# Patient Record
Sex: Male | Born: 1940 | Race: White | Hispanic: No | Marital: Married | Smoking: Heavy tobacco smoker
Health system: Southern US, Community
[De-identification: ages and names within clinical notes are randomized; demographics above are authoritative.]

## PROBLEM LIST (undated history)

## (undated) DIAGNOSIS — J449 Chronic obstructive pulmonary disease, unspecified: Secondary | ICD-10-CM

## (undated) DIAGNOSIS — N289 Disorder of kidney and ureter, unspecified: Secondary | ICD-10-CM

## (undated) DIAGNOSIS — G4730 Sleep apnea, unspecified: Secondary | ICD-10-CM

## (undated) DIAGNOSIS — H409 Unspecified glaucoma: Secondary | ICD-10-CM

## (undated) DIAGNOSIS — I10 Essential (primary) hypertension: Secondary | ICD-10-CM

## (undated) DIAGNOSIS — Z950 Presence of cardiac pacemaker: Secondary | ICD-10-CM

## (undated) HISTORY — PX: MEDIAL PARTIAL KNEE REPLACEMENT: SHX5965

## (undated) HISTORY — PX: SHOULDER SURGERY: SHX246

## (undated) HISTORY — PX: PACEMAKER INSERTION: SHX728

## (undated) HISTORY — PX: OTHER SURGICAL HISTORY: SHX169

## (undated) HISTORY — PX: KIDNEY STONE SURGERY: SHX686

## (undated) HISTORY — PX: SPINE SURGERY: SHX786

---

## 1997-11-29 ENCOUNTER — Emergency Department (HOSPITAL_COMMUNITY): Admission: EM | Admit: 1997-11-29 | Discharge: 1997-11-29 | Payer: Self-pay | Admitting: Emergency Medicine

## 1999-10-12 ENCOUNTER — Encounter: Payer: Self-pay | Admitting: Internal Medicine

## 1999-10-12 ENCOUNTER — Encounter (INDEPENDENT_AMBULATORY_CARE_PROVIDER_SITE_OTHER): Payer: Self-pay | Admitting: Specialist

## 1999-10-12 ENCOUNTER — Encounter: Admission: RE | Admit: 1999-10-12 | Discharge: 1999-10-12 | Payer: Self-pay | Admitting: Internal Medicine

## 2000-11-16 ENCOUNTER — Emergency Department (HOSPITAL_COMMUNITY): Admission: EM | Admit: 2000-11-16 | Discharge: 2000-11-16 | Payer: Self-pay | Admitting: Emergency Medicine

## 2000-11-17 ENCOUNTER — Encounter: Admission: RE | Admit: 2000-11-17 | Discharge: 2000-11-17 | Payer: Self-pay | Admitting: *Deleted

## 2000-11-17 ENCOUNTER — Encounter: Payer: Self-pay | Admitting: *Deleted

## 2000-11-21 ENCOUNTER — Encounter: Payer: Self-pay | Admitting: *Deleted

## 2000-11-21 ENCOUNTER — Observation Stay (HOSPITAL_COMMUNITY): Admission: RE | Admit: 2000-11-21 | Discharge: 2000-11-22 | Payer: Self-pay | Admitting: *Deleted

## 2001-11-23 ENCOUNTER — Encounter: Payer: Self-pay | Admitting: Emergency Medicine

## 2001-11-23 ENCOUNTER — Emergency Department (HOSPITAL_COMMUNITY): Admission: EM | Admit: 2001-11-23 | Discharge: 2001-11-23 | Payer: Self-pay | Admitting: Emergency Medicine

## 2003-03-12 ENCOUNTER — Emergency Department (HOSPITAL_COMMUNITY): Admission: AD | Admit: 2003-03-12 | Discharge: 2003-03-12 | Payer: Self-pay | Admitting: Family Medicine

## 2008-05-29 ENCOUNTER — Encounter: Payer: Self-pay | Admitting: Internal Medicine

## 2008-05-30 ENCOUNTER — Inpatient Hospital Stay (HOSPITAL_COMMUNITY): Admission: EM | Admit: 2008-05-30 | Discharge: 2008-05-31 | Payer: Self-pay | Admitting: Emergency Medicine

## 2008-05-30 ENCOUNTER — Encounter (INDEPENDENT_AMBULATORY_CARE_PROVIDER_SITE_OTHER): Payer: Self-pay | Admitting: Internal Medicine

## 2008-07-25 ENCOUNTER — Ambulatory Visit (HOSPITAL_COMMUNITY): Admission: RE | Admit: 2008-07-25 | Discharge: 2008-07-26 | Payer: Self-pay | Admitting: Cardiology

## 2009-03-14 ENCOUNTER — Inpatient Hospital Stay (HOSPITAL_COMMUNITY): Admission: EM | Admit: 2009-03-14 | Discharge: 2009-03-15 | Payer: Self-pay | Admitting: Emergency Medicine

## 2010-05-02 LAB — DIFFERENTIAL
Basophils Absolute: 0 10*3/uL (ref 0.0–0.1)
Basophils Relative: 1 % (ref 0–1)
Neutro Abs: 4.3 10*3/uL (ref 1.7–7.7)
Neutrophils Relative %: 59 % (ref 43–77)

## 2010-05-02 LAB — CBC
HCT: 40.3 % (ref 39.0–52.0)
Hemoglobin: 13.4 g/dL (ref 13.0–17.0)
MCHC: 33.3 g/dL (ref 30.0–36.0)
MCHC: 33.5 g/dL (ref 30.0–36.0)
MCV: 96.2 fL (ref 78.0–100.0)
Platelets: 236 10*3/uL (ref 150–400)
RBC: 4.19 MIL/uL — ABNORMAL LOW (ref 4.22–5.81)
RDW: 13.7 % (ref 11.5–15.5)

## 2010-05-02 LAB — POCT CARDIAC MARKERS
CKMB, poc: <1 ng/mL — ABNORMAL LOW (ref 1.0–8.0)
CKMB, poc: <1 ng/mL — ABNORMAL LOW (ref 1.0–8.0)
Troponin i, poc: <0.05 ng/mL (ref 0.00–0.09)

## 2010-05-02 LAB — URINALYSIS, ROUTINE W REFLEX MICROSCOPIC
Bilirubin Urine: NEGATIVE
Ketones, ur: NEGATIVE mg/dL
Nitrite: NEGATIVE
pH: 6.5 (ref 5.0–8.0)

## 2010-05-02 LAB — CARDIAC PANEL(CRET KIN+CKTOT+MB+TROPI)
Relative Index: INVALID (ref 0.0–2.5)
Relative Index: INVALID (ref 0.0–2.5)
Total CK: 70 U/L (ref 7–232)
Troponin I: 0.01 ng/mL (ref 0.00–0.06)

## 2010-05-02 LAB — POCT I-STAT, CHEM 8
Calcium, Ion: 1.12 mmol/L (ref 1.12–1.32)
Chloride: 105 mEq/L (ref 96–112)
HCT: 46 % (ref 39.0–52.0)
Potassium: 3.9 mEq/L (ref 3.5–5.1)

## 2010-05-02 LAB — BASIC METABOLIC PANEL
BUN: 20 mg/dL (ref 6–23)
CO2: 26 mEq/L (ref 19–32)
CO2: 26 mEq/L (ref 19–32)
Calcium: 8.6 mg/dL (ref 8.4–10.5)
Calcium: 8.9 mg/dL (ref 8.4–10.5)
Creatinine, Ser: 0.94 mg/dL (ref 0.4–1.5)
Creatinine, Ser: 1.04 mg/dL (ref 0.4–1.5)
GFR calc Af Amer: >60 mL/min (ref >60)
GFR calc Af Amer: >60 mL/min (ref >60)
Glucose, Bld: 121 mg/dL — ABNORMAL HIGH (ref 70–99)
Glucose, Bld: 96 mg/dL (ref 70–99)

## 2010-05-02 LAB — LIPID PANEL
HDL: 36 mg/dL — ABNORMAL LOW (ref >39)
Total CHOL/HDL Ratio: 2.3 RATIO
VLDL: 9 mg/dL (ref 0–40)

## 2010-05-02 LAB — CK TOTAL AND CKMB (NOT AT ARMC)
CK, MB: 1.3 ng/mL (ref 0.3–4.0)
Relative Index: 1.3 (ref 0.0–2.5)
Total CK: 102 U/L (ref 7–232)

## 2010-05-26 LAB — CARDIAC PANEL(CRET KIN+CKTOT+MB+TROPI)
CK, MB: 2.4 ng/mL (ref 0.3–4.0)
Relative Index: 2.1 (ref 0.0–2.5)
Total CK: 115 U/L (ref 7–232)
Troponin I: 0.01 ng/mL (ref 0.00–0.06)
Troponin I: 0.01 ng/mL (ref 0.00–0.06)

## 2010-05-26 LAB — POCT I-STAT, CHEM 8
Creatinine, Ser: 1.3 mg/dL (ref 0.4–1.5)
Glucose, Bld: 119 mg/dL — ABNORMAL HIGH (ref 70–99)
HCT: 49 % (ref 39.0–52.0)
Hemoglobin: 16.7 g/dL (ref 13.0–17.0)
Potassium: 3.6 mEq/L (ref 3.5–5.1)
TCO2: 27 mmol/L (ref 0–100)

## 2010-05-26 LAB — CBC
HCT: 47.8 % (ref 39.0–52.0)
MCHC: 34.4 g/dL (ref 30.0–36.0)
MCV: 95.4 fL (ref 78.0–100.0)
Platelets: 137 10*3/uL — ABNORMAL LOW (ref 150–400)
RBC: 5.02 MIL/uL (ref 4.22–5.81)

## 2010-05-26 LAB — COMPREHENSIVE METABOLIC PANEL
ALT: 34 U/L (ref 0–53)
AST: 27 U/L (ref 0–37)
Alkaline Phosphatase: 53 U/L (ref 39–117)
Alkaline Phosphatase: 56 U/L (ref 39–117)
BUN: 17 mg/dL (ref 6–23)
CO2: 27 mEq/L (ref 19–32)
CO2: 27 mEq/L (ref 19–32)
Chloride: 105 mEq/L (ref 96–112)
Creatinine, Ser: 0.94 mg/dL (ref 0.4–1.5)
GFR calc Af Amer: >60 mL/min (ref >60)
GFR calc non Af Amer: >60 mL/min (ref >60)
GFR calc non Af Amer: >60 mL/min (ref >60)
Glucose, Bld: 125 mg/dL — ABNORMAL HIGH (ref 70–99)
Glucose, Bld: 114 mg/dL — ABNORMAL HIGH (ref 70–99)
Potassium: 3.7 mEq/L (ref 3.5–5.1)
Potassium: 3.9 mEq/L (ref 3.5–5.1)
Sodium: 137 mEq/L (ref 135–145)
Total Bilirubin: 1.6 mg/dL — ABNORMAL HIGH (ref 0.3–1.2)
Total Protein: 6.3 g/dL (ref 6.0–8.3)

## 2010-05-26 LAB — DIFFERENTIAL
Basophils Relative: 1 % (ref 0–1)
Eosinophils Absolute: 0.1 10*3/uL (ref 0.0–0.7)
Eosinophils Relative: 2 % (ref 0–5)
Monocytes Relative: 13 % — ABNORMAL HIGH (ref 3–12)
Neutrophils Relative %: 49 % (ref 43–77)

## 2010-05-26 LAB — LIPID PANEL
Cholesterol: 102 mg/dL (ref 0–200)
LDL Cholesterol: 63 mg/dL (ref 0–99)
LDL Cholesterol: 67 mg/dL (ref 0–99)
Total CHOL/HDL Ratio: 4.1 RATIO
Triglycerides: 71 mg/dL (ref <150)
Triglycerides: 73 mg/dL (ref <150)
VLDL: 14 mg/dL (ref 0–40)
VLDL: 15 mg/dL (ref 0–40)

## 2010-05-26 LAB — POCT CARDIAC MARKERS: CKMB, poc: 3.1 ng/mL (ref 1.0–8.0)

## 2010-05-26 LAB — LACTIC ACID, PLASMA: Lactic Acid, Venous: 1 mmol/L (ref 0.5–2.2)

## 2010-05-26 LAB — PROTIME-INR: Prothrombin Time: 13.3 seconds (ref 11.6–15.2)

## 2010-06-29 NOTE — Discharge Summary (Signed)
Dylan Harrington, EMBLETON NO.:  [id number]   MEDICAL RECORD NO.:  [id number]          PATIENT TYPE:  INP   LOCATION:                               FACILITY:  [facility]   PHYSICIAN:  Eduard Clos, MDDATE OF BIRTH:  1940-12-09   DATE OF ADMISSION:  05/30/2008  DATE OF DISCHARGE:  05/31/2008                               DISCHARGE SUMMARY   PRIMARY CARE PHYSICIAN:  Lynne Leader, MD. at [facility].   PRIMARY GASTROENTEROLOGIST:  Jordan Hawks. Elnoria Howard, MD.   COURSE IN THE HOSPITAL:  A 70 year old male with history of hypertension  and glaucoma, presently complains of nausea and chest pain more on both  shoulders after he eats.  The patient admitted to telemetry floor.  Serial cardiac enzymes and EKGs were done which were within acceptable  limits.  Cardiology was consulted.  The patient underwent a nuclear  stress Myoview which did not show any ischemic findings.  A 2-D echo  showed EF of 55-60% with systolic function was normal with no regional  wall motion abnormalities.  The patient did have some shortness of  breath and got improved with introduction of Spiriva and Albuterol HFA.  A sonogram of the abdomen did not show any features of acute  cholecystitis.  It did show some abnormal features in the kidney for  which an MRI was done.  The MRI was read as showing only complex cyst in  the kidney and suggested no further workup on that.  Patient also had a  barium swallow which showed mild esophageal dysmotility.  The patient  does have a follow up appointment with Dr. Jeani Hawking for colonoscopy.  I did discuss with the patient and his wife to have an endoscopy as  outpatient for the same for which they have agreed.  At this time of  this dictation, the patient is hemodynamically stable.  The patient was  strongly advised to quit smoking.   PROCEDURES:  1. Chest x-ray on May 29, 2008 shows borderline cardiomegaly, no      acute abnormality.  2. Nuclear  Myoview on May 29, 2008 shows no perfusion defects.  3. Barium swallow on May 30, 2008 shows mild esophageal dysmotility.      No evidence for high-grade stricture or mass.  4. Sonogram of the abdomen on May 30, 2008 shows no acute findings      and no evidence for cholecystitis.  I suspect fatty infiltration of      the liver.  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  5. MRI of the abdomen with and without contrast on May 30, 2008      shows bilateral complex Bosniak category 2 renal cysts that have      been benign and did not require any further workup.   FINAL DIAGNOSES:  1. Atypical chest pain.  2. Shortness of breath from chronic obstructive pulmonary disease.  3.  Tobacco abuse.  4. Renal cysts, benign appearing  5. Mild esophageal dysmotility.  6. Arthritis.   DISCHARGE MEDICATIONS:  1. Lisinopril/HCTZ 20/4.5 mg p.o. b.i.d.  2. Lumigan eye drops as taken previously.  3. Omeprazole 40 mg p.o. daily.  4. Tylenol 650 mg p.o. q.6 h. p.r.n. for pain.  5. Spiriva 18 mcg 1 inhalation daily.  6. Albuterol HFA 2 puffs q.6 h. p.r.n. for shortness of breath.  7. Chantix starter dose pack.   PLAN:  1. The patient advised to follow up with his primary care physician      within a week's time.  2. Follow with Dr. Elnoria Howard for EGD and colonoscopy as scheduled.  3. Strongly advised to quit smoking.  4. To be on cardiac healthy diet.      Eduard Clos, MD  Electronically Signed     ANK/MEDQ  D:  05/31/2008  T:  05/31/2008  Job:  [id number]   cc:   Lynne Leader, MD   Jordan Hawks. Elnoria Howard, MD  Fax: [phone]

## 2010-06-29 NOTE — H&P (Signed)
Dylan Harrington, Dylan NO.:  [id number]   MEDICAL RECORD NO.:  [id number]          PATIENT TYPE:  EMS   LOCATION:  ED                           FACILITY:  [facility]   PHYSICIAN:  Pedro Earls, MD     DATE OF BIRTH:  Oct 01, 1940   DATE OF ADMISSION:  05/28/2008  DATE OF DISCHARGE:                              HISTORY & PHYSICAL   CHIEF COMPLAINT:  Nausea, diaphoresis after eating and right shoulder  pain.   HISTORY OF PRESENT ILLNESS:  This is a 70 year old white male patient  with a past medical history significant for hypertension, smoking  addiction and right chronic shoulder pain who had been experiencing  nausea and diaphoresis since the past 3 days, especially when the  patient tried to eat something.  The patient is also stating that he is  been having this right shoulder pain which is chronic but recently  gotten worse and stayed constant since past few days.  As per the  patient's spouse, he had a history of similar episode where he would  have nausea and diaphoresis after eating many years ago which was  thought to be due to hypoglycemia, but that the patient recovered from  that episode and this has started coming back again.  The patient was  also seen approximately a year ago by Dr. Harlow Asa, Cardiology, to  evaluate his abnormal EKG and the patient subsequently did not get any  stress test done at that time and was in, as far as cardiac was  concerned, was within normal range.   REVIEW OF SYSTEMS:  As above.  Rest of systems are negative.   PAST HISTORY:  1. Hypotension.  2. Chronic back pain.  3. Chronic obstructive pulmonary disease.  4. Degenerative joint disease.  5. Glaucoma.  6. Nephrolithiasis.  7. Sleep apnea.   PAST SURGICAL HISTORY:  Right shoulder surgery for repair.   SOCIAL HISTORY:  Negative for alcohol or IV drugs.  Patient is a smoker,  smoked for 50 years one pack per day.   FAMILY HISTORY:  Noncontributory.   MEDICATIONS:  1. Lumigan.  2. Lisinopril/hydrochlorothiazide 12.5 b.i.d.  3. Naproxen 500 b.i.d.   ALLERGIES:  KEFLEX.   PHYSICAL EXAMINATION:  VITAL SIGNS:  Temperature 97.4,  blood pressure  160/99 to 138/98, pulse is 77 to 78, respirations 16, pulse ox 99% on 2  liters.  GENERAL:  The patient is awake, alert, oriented x3.  Does not appear to  be in acute distress.  HEENT::  Pupils equal, round.  Conjunctiva with no pallor. Extraocular  movements are intact.  NECK:  Supple.  No JVD.  No lymph nodes.  HEART:  No murmurs, heaves, gallops.  CHEST:  Clear.  ABDOMEN:  Soft, nontender.  Bowel sounds present.  No  hepatosplenomegaly.  EXTREMITIES:  No clubbing, cyanosis or edema.  CNS:  Grossly intact.  SKIN:  No rashes.   LABORATORY DATA:  Patient labs are within normal range except platelet  count of 137.  EKG showed inverted T-waves in V1 and V3, which are not  changed from previous EKG from November 20, 2000.   IMPRESSION:  1. Nausea and diaphoresis secondary to pork food  2. Gastroesophageal reflux disease.  3. Hypertension, uncontrolled.  4. Tobacco addiction.  5. Right shoulder pain.   PLAN:  1. Admit to 23 hour telemetry.  Cycle enzymes with a stress test in      the morning to be read by Dr. Harlow Asa.  2. Protonix b.i.d.  3. Discharge if the stress test is negative.      Pedro Earls, MD  Electronically Signed     NS/MEDQ  D:  05/29/2008  T:  05/29/2008  Job:  [id number]   cc:   Madaline Savage, M.D.  Fax: [phone]

## 2010-06-29 NOTE — Discharge Summary (Signed)
NAMEJARRAH, Dylan Harrington NO.:  [id number]   MEDICAL RECORD NO.:  [id number]          PATIENT TYPE:  OIB   LOCATION:  3715                         FACILITY:  [facility]   PHYSICIAN:  Dylan Slot, MD     DATE OF BIRTH:  1940/04/15   DATE OF ADMISSION:  07/25/2008  DATE OF DISCHARGE:  07/26/2008                               DISCHARGE SUMMARY   DISCHARGE DIAGNOSES:  1. 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      a.     Placement of permanent transvenous pacemaker, Medtronic dual-       chamber EnRhythm.  2. Hypertension.  3. Chronic obstructive pulmonary disease with chronic tobacco abuse.  4. Obstructive sleep apnea with CPAP.   DISCHARGE CONDITION:  Stable.   PROCEDURE:  Placement of permanent transvenous pacemaker on July 25, 2008, by Dr. Ritta Harrington, EnRhythm dual chamber.   DISCHARGE MEDICATIONS:  1. Lopressor 25 mg 1 twice a day.  2. Lumigan eye drops in both eyes daily.  3. Lisinopril 20/12.5 one twice a day.  4. Naprosyn 500 mg once or twice a day as before.  5. Ventolin inhaler as needed.  6. Spiriva inhalation daily.  7. Omeprazole 40 mg daily as before.  8. Systane 1 drop both eyes daily and Natural Tears as before.  9. CPAP at bedtime as before.   DISCHARGE CONDITION:  Stable.   DISCHARGE INSTRUCTIONS:  1. No driving for 1 week.  2. No showers for 1 week.  3. Wound care, please see discharge instructions and pacemaker site.  4. Activity as tolerated, not using the left arm.  5. Follow with Dr. Clarene Harrington in 1 week.  The office will call with date      and time for pacer site check and he would need to follow up with.   HISTORY OF PRESENT ILLNESS:  A 70 year old male presented to Dr.  Fredirick Harrington office and Dr. Donavan Harrington, actually patient of Dr. Elsie Harrington with a  history of sleep apnea, wears CPAP at night.  Continues to use tobacco.  He does have COPD.  He is hypertensive.  He had been having  episodes of  general weakness and near-syncope.  Event monitor revealed sinus rhythm  with occasional PVCs and sinus brady to a rate of 38 at night.  He also  had junctional rhythm with rates as 64 and nonsustained ventricular  tachycardia, the SVT appeared to be 2:1 flutter.   He had had a Cardiolite study negative for ischemia, EF 64%, echo with  normal LV systolic function and moderate LVH.  GI evaluation was  negative.   The patient, it was felt due to his arrhythmia, would need a pacemaker,  so we could treat him more effectively.  He was brought in electively on  July 25, 2008, by Dr. Lynnea Harrington, and underwent pacemaker implantation,  which he tolerated well.  His chest x-ray postprocedure showed no  pneumothorax and by the morning of July 26, 2008, he was stable and  ready for discharge home.  We did add Lopressor to  his medical regimen  and he will follow up with Dr. Clarene Harrington as an outpatient.   LABORATORY DATA:  BUN was 16 and creatinine was 0.89.  Preprocedural  labs:  Sodium 139, potassium 3.8, glucose 115, BUN 22, and creatinine  1.10.  UA was clear.  Hemoglobin 16.3, hematocrit 46.6, WBC 7.6, and  platelets 176.  INR of 1 and PTT of 30.   Chest x-ray was evaluated by Dr. Lynnea Harrington, and no pneumothorax was seen.      Dylan Harrington, N.P.      Dylan Slot, MD  Electronically Signed    LRI/MEDQ  D:  07/26/2008  T:  07/26/2008  Job:  [id number]   cc:   Dylan Harrington, M.D.  Dylan A. Melven Sartorius, MD

## 2010-06-29 NOTE — Consult Note (Signed)
[name], Harrington NO.:  [id number]   MEDICAL RECORD NO.:  [id number]          PATIENT TYPE:  INP   LOCATION:                               FACILITY:  [facility]   PHYSICIAN:  Antonieta Iba, MD   DATE OF BIRTH:  1940/06/12   DATE OF CONSULTATION:  05/29/2008  DATE OF DISCHARGE:  05/31/2008                                 CONSULTATION   CHIEF COMPLAINT:  Shortness of breath.   HISTORY OF PRESENT ILLNESS:  [name] is a 70 year old male who is  followed at Urgent Care.  He has a history of hypertension and smoking.  He has had no prior history of coronary disease.  He actually had seen  Dr. Elsie Lincoln approximately a year ago but never got his stress test.  He  does have a baseline abnormal electrocardiogram.  His wife, Dylan Harrington, is a Customer service manager at [employer] who worked on the cardiac floor.  The patient was in his usual health until approximately Monday of this  week.  He developed shortness of breath and became ashen after eating.  This has recurred.  Since then the patient has had dyspnea on exertion.  He denies any chest pain.  He denies any nausea, vomiting or  diaphoresis.  He denies any abdominal pain.  He has no symptoms at rest.  His initial enzymes were negative.  A Persantine Myoview was ordered by  the admitting physicians and is to be done today.   PAST MEDICAL HISTORY:  1. Remarkable for treated hypertension.  2. He has no history of diabetes or dyslipidemia.  3. He has had some arthritis in his right shoulder and has had      previous right shoulder repair.  4. He has had prior back surgery.  5. He has a history of nephrolithiasis and has been hospitalized for      this in the past.  6. He has a history of glaucoma.  7. Records indicate a history of sleep apnea although I could not find      any documentation of this, office records are pending.   CURRENT MEDICATIONS:  Lumigan ophthalmic drops and  lisinopril/hydrochlorothiazide and  Naprosyn.   He is allergic to [facility].   SOCIAL HISTORY:  He is married, he is a pack-a-day smoker and has been  for 50 years.  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   FAMILY HISTORY:  Unremarkable for coronary disease.  His mother had a  history of atrial fibrillation.  His father had scleroderma.   REVIEW OF SYSTEMS:  Essentially unremarkable except noted above.  He  denies any GI bleeding or melena.  He has not had chest pressure or  chest tightness.  There is no history of diabetes.  He says his lipids  have been low.  He has not had recent cough, fever or chills.   PHYSICAL EXAM:  Blood pressure 138/96, pulse 77, temperature 97.4.  GENERAL:  He is a well-developed, well-nourished male in no acute  distress.  HEENT:  Normocephalic, extraocular movements are intact, sclerae is  nonicteric.  Lids and conjunctivae are within normal limits.  NECK:  Without JVD or bruit.  CHEST:  Clear to auscultation and percussion with diminished breath  sounds.  ABDOMEN:  Nontender.  No hepatosplenomegaly, no bruits.  EXTREMITIES:  Without edema.  Distal pulses are 3+/4.  There are no  femoral bruits noted.  NEURO:  Grossly intact.  He is awake, alert, oriented, cooperative,  moves all extremities without obvious deficit.  SKIN:  Warm and dry.   LABS:  White count 5.9, hemoglobin 16.5, hematocrit 47.8, platelets 137.  D-dimer 0.44.  Troponins are negative x2.  For some unknown reason, BMP  is not in the computer.  Chest x-ray shows borderline cardiomegaly with  no abnormalities.  Electrocardiogram is mildly abnormal with some T-wave  inversion in V2 through V4.   IMPRESSION:  1. Dyspnea on exertion, rule out angina.  2. Abnormal electrocardiogram.  3. Treated hypertension.  4. Smoking.  5. History of nephrolithiasis.  6. History of glaucoma.   PLAN:  Patient will proceed with his Persantine Myoview today.  He may  need diagnostic catheterization pending results.  The patient will  be  seen by Dr. Dossie Arbour.      Abelino Derrick, P.A.      Antonieta Iba, MD  Electronically Signed    LKK/MEDQ  D:  05/29/2008  T:  05/29/2008  Job:  [id number]

## 2010-07-02 NOTE — Op Note (Signed)
NAME:  Dylan Harrington, Dylan Harrington                        ACCOUNT NO.:  [id number]   MEDICAL RECORD NO.:  [id number]                   PATIENT TYPE:  EMS   LOCATION:  ED                                   [facility]ACILITY:  [facility]   PHYSICIAN:  Dionne Ano. Everlene Other, M.D.         DATE OF BIRTH:  November 27, 1940   DATE OF PROCEDURE:  DATE OF DISCHARGE:                                 OPERATIVE REPORT   HISTORY:  I had the pleasure to see Dylan Harrington in the emergency room  upon the kind referral from [facility]. I was asked to consult with him in  regards to his upper extremity predicament. This patient had a crank case  fall on his right hand small finger distal phalanx region. He is left hand  dominant. He was given a tetanus shot in the emergency room and I was asked  to see him in regards to his injury. He denies neck, back, chest or  abdominal pain. The patient has been given digital block.   PAST MEDICAL HISTORY:  The patient has a history of hypertension.   PAST SURGICAL HISTORY:  Kidney surgery. The patient has had back surgery as  well as shoulder surgery.   CURRENT MEDICATIONS:  Lisinopril.   ALLERGIES:  KEFLEX.   SOCIAL HISTORY:  He smokes one pack per day, occasionally drinks. He works  on transmissions for a living.   PHYSICAL EXAMINATION:  Reveals a very pleasant male alert and oriented in no  acute distress. He has normal chest exam. Shoulder and elbow examination  appears to be benign on inspection. The patient's right small finger has an  open distal phalanx fracture. The open distal phalanx fracture and closed  nail bed laceration and partial avulsion of the nail. This is an open  injury, there is comminution present. I have reviewed the x-rays which show  a comminuted distal phalanx fracture about the right small finger.   I should note the left upper extremity which is his dominant extremity is  neurovascularly intact without abnormality.   IMPRESSION:  Open right small finger  distal phalanx fracture with nail bed  injury.   PLAN:  I verbally consented him for I&D and repair of structures as  necessary.   DESCRIPTION OF PROCEDURE:  The patient was brought to the procedure area, he  underwent I&D of skin, subcutaneous tissue, bone and nail tissue as well as  the extensor apparatus with copious amounts of irrigation. This was an  incisional debridement accomplished without difficulty. This was an open  fracture. Following this, the fracture ends were approximated. This was done  with digital manipulation. I performed this without difficulty and to my  satisfaction following open treatment of the distal phalanx fracture. The  patient then underwent a very complex nail bed laceration repair. This was  done with 5-0 and 7-0 Chromic suture, interrupted in technique. The wound  edges were quite jagged. I trimmed these  to normal dimensions and then  repaired the patient's nail bed. Following this, the tourniquet was deflated  and hemostasis was noted to be adequate. The patient did have digital tip  refill; however, there was a large amount of ecchymosis present throughout  the finger and contusion. I repaired loosely skin edges and following this  placed a sterile bandage. The patient had Adaptic placed on the eponychial  fold to prevent nail bed adherence. I discussed with him the postoperative  plans, etc. We went over do's and don'ts, etc. I placed him on Percocet for  pain, Robaxin for muscle spasm and Bactrim for antibiotic prophylaxis. He is  going to  returned to the office and see me in 7-10 days, see my therapist in 5 days  and will proceed accordingly. We have discussed all do's and don'ts and all  questions have been encouraged and answered. He tolerated the procedure well  and there were no complications.                                               Dionne Ano. Everlene Other, M.D.    Nash Mantis  D:  11/23/2001  T:  11/24/2001  Job:  [id number]

## 2010-07-02 NOTE — Op Note (Signed)
[facility]  Patient:    Dylan Harrington, Dylan Harrington Visit Number: [id number] MRN: [id number]          Service Type: SUR Location: 3W 0378 02 Attending Physician:  Dalbert Mayotte Dictated by:   Radene Knee., M.D. Proc. Date: 11/21/00 Admit Date:  11/21/2000                             Operative Report  DATE OF BIRTH:  04-Jan-1941  PREOPERATIVE DIAGNOSES: 1. Left ureterovesical junction calculus, 6 x 4 mm with grade    1-2 hydronephrosis above. 2. Glaucoma. 3. Sleep apnea. 4. Hypertension. 5. Elevated uric acid of 7.6.  POSTOPERATIVE DIAGNOSES: 1. Left ureterovesical junction calculus, 6 x 4 mm with grade    1-2 hydronephrosis above. 2. Glaucoma. 3. Sleep apnea. 4. Hypertension. 5. Elevated uric acid of 7.6. 6. Early benign prostatic hypertrophy.  OPERATION PERFORMED:  Cystoscopy, left ureteroscopic stone extraction, and retrograde pyelograms with insertion of a stent (#6 Kwart).  SURGEON:  Radene Knee., M.D.  DESCRIPTION OF PROCEDURE:  This is a 70 year old male brought to the operating room and underwent successful induction of general anesthesia.  Received IV gentamicin 80 mg.  He was prepped and draped in the lithotomy position and the bladder inspected with a #22 cystourethroscope using 70 and 12 degree lenses. There was no stone or tumor in the bladder.  There was edema and some redness around the left ureteral orifice.  The right ureteral orifice appeared normal. There was elevation of the posterior lip by median bar formation.  There was a little early lateral lobe hypertrophy with anterior ________ 3.5 cm.  There is no distal urethral stricture.  Using an 0.038 glidewire and the deflected bridge, an access was obtained up the left ureter, passing the glidewire through a #5 ureteral catheter to the region of the left renal pelvis under fluoroscopic control.  The glidewire was then used to introduce a 4 cm x  15 mm Uromax dilating balloon and the lower ureter was dilated for five minutes with the dilating balloon.  The dilating balloon was then removed.  The guidewire was fixed in place with a hemostat and the 9.5 short ureteroscope was introduced.  On the first passage, the stone was not seen and on the second passage, the stone was seen.  It was engaged with a Pfister-Schwartz stone basket and extracted.  Larey Seat out of the basket into the urethra and was retrieved with alligator forceps from the urethra.  Using the guidewire, an open-ended catheter was replaced into the kidney, and a left pyeloureterogram was performed, and there was no evidence of extravasation.  The ureter was dilated down to the left ureterovesical junction.  Using the guidewire which was replaced through the open-ended catheter, a #6 Kwart-type double-J ureteral stent was inserted under fluoroscopic control with a pull string.  It was noted to have curl in the renal pelvis and a good curl in the bladder on fluoroscopy and cystoscopy. The bladder was drained with a #16 Foley.  The pull string was taped to the penis.  The patient returned to the recovery room in a stable condition. Dictated by:   Radene Knee., M.D. Attending Physician:  Dalbert Mayotte DD:  11/21/00 TD:  11/21/00 Job: [id number]

## 2010-07-02 NOTE — H&P (Signed)
[facility]  Patient:    GOMER, [place] Visit Number: [id number] MRN: [id number]          Service Type: EMS Location: [facility] Attending Physician:  Ilene Qua Dictated by:   Radene Knee., M.D. Admit Date:  11/16/2000 Discharge Date: 11/16/2000                           History and Physical  OUTPATIENT HISTORY AND PHYSICAL  DATE OF BIRTH:  1940/09/12  HISTORY OF PRESENT ILLNESS: This patient, age 70, was seen in the [facility] Emergency Room on midnight November 17, 2000, with pain in the left side and was, apparently, found to have some pyuria.  A culture was taken, he was given Percocet and advised to see me in the office, which he did November 17, 2000, and was found to have a left hydronephrosis by ultrasound.  A KUB suggested a 4 x 6 mm calculus at the left ureterovesical junction.  This 70 year old male has a history of stones dating back to 1968.  He had a stone extraction about 1970, and he last passed a small 2 mm calculus in December 2001.  He has had some prostatitis in the past and BPH, some small renal cysts which were stable on CT August 2001, initially noted in 1998.  The patient gets up three times a night, voids every two to three hours, has a good flow, has just a little burning on urination.  He was started on Cipro 500 b.i.d., given Mepergan Fortis for pain in the office, a culture was obtained, and he was advised that he should have the stone extracted if it does not pass in the next two or three days.  He will strain the urine.  His wife will check the temperature, she is a Engineer, civil (consulting).  ALLERGIES:  KEFLEX.  CURRENT MEDICATIONS:  Percocet, Mepergan Fortis, Naprosyn 500 b.i.d., Alphagan drops for glaucoma, and Z-Bec, and he is on lisinopril HCTZ 20/25 for blood pressure.  FAMILY HISTORY:  Positive for diabetes, hypertension, myocardial infarction, stones, scleroderma, and renal cell carcinoma.  His father  died, age 19, scleroderma.  Grandmother died of renal cell carcinoma, 83.  Brother died of renal cell carcinoma, age 92.  He had four siblings in all.  He is married. He has a sister with systemic lupus and carcinoma of the lung.  SOCIAL HISTORY:  The patient is married.  He smokes one pack of cigarettes daily for more than 35 years.  Occasionally he does use beer on the weekends. Works as a Curator.  REVIEW OF SYSTEMS:  His weight and general health have been stable.  Hearing and vision are good.  His teeth have been removed.  He does occasionally have headaches, for which he uses aspirin and Goodys powders.  CARDIORESPIRATORY: Denies any chest pain but does have COPD and smokers cough, no asthma. GASTROINTESTINAL:  Denies any peptic ulcer disease, hepatitis, or bloody, tarry stools.  EXTREMITIES AND JOINTS:  He does have rather severe arthritis. He has had a shoulder operation in 1988.  NEUROPSYCHIATRIC:  Unremarkable except for the headaches.  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  PAST MEDICAL HISTORY: 1. Hypertension. 2. Stones. 3. Stone extraction 1970. 4. Shoulder operation 1987.  PHYSICAL EXAMINATION:  GENERAL:  Well-developed, well-nourished 70 year old male.  VITAL SIGNS:  Temperature 98.5, pulse 60, respirations 14, blood pressure 120/80.  HEENT:  Ears and tympanic membranes unremarkable.  Eyes react normally to light and accommodation.  Extraocular movements intact.  Pharynx benign. Teeth have been removed and replaced by plates.  NECK:  No enlargement of nodes or thyroid.  CHEST:  Clear to percussion and auscultation.  Increased AP diameter.  HEART:  Normal sinus rhythm, no murmur detected.  ABDOMEN:  Moderately obese.  Liver, kidney, spleen, masses, tenderness, hernias not detected.  GENITOURINARY:  External genitalia, meatus normal, penis uncircumcised.   The epididymides are thickening bilaterally.  The testes are good size and symmetric.  Scrotum, anus, perineum normal.  Rectal tone good.  Prostate smooth, firm, right lobe seems a little larger than the left, about 20 g, but not really very tender.  EXTREMITIES:  No edema.  Good peripheral pulses.  NEUROLOGIC:  Grossly normal reflexes and sensation.  LYMPHATIC:  No nodes palpated.  SKIN:  No skin lesions noted at this time.  LABORATORY DATA:  Ultrasound examination in the office reveals a grade 1-2 hydronephrosis on the left, and he brings a KUB that suggests a 6 x 4 mm calculus in the lower left ureter.  Urinalysis shows 5-10 white cells and red cells.  Culture pending.  DIAGNOSES: 1. Left ureterovesical junction calculus with hydronephrosis, grade 1, stone    estimated 4 x 6 mm. 2. Prostatitis and benign prostatic hypertrophy, last prostate-specific    antigen was 1.18 January 2000. 3. Small renal cyst, stable on computed tomography August 2001. 4. Hypertension. 5. Glaucoma.  PLAN:  Observe the patient with Mepergan Fortis, Percocet for pain, Cipro 500 b.i.d., strain urine, force fluids.  Will see him back here on November 20, 2000.  If his stone has not passed, will proceed with cystoscopy and stone extraction. Dictated by:   Radene Knee., M.D. Attending Physician:  Ilene Qua DD:  11/17/00 TD:  11/18/00 Job: [id number]

## 2013-07-28 ENCOUNTER — Emergency Department (INDEPENDENT_AMBULATORY_CARE_PROVIDER_SITE_OTHER)
Admission: EM | Admit: 2013-07-28 | Discharge: 2013-07-28 | Disposition: A | Discharge status: Home / Self Care | Payer: Medicare Other | Source: Home / Self Care | Attending: Emergency Medicine | Admitting: Emergency Medicine

## 2013-07-28 ENCOUNTER — Encounter (HOSPITAL_COMMUNITY): Payer: Self-pay | Admitting: Emergency Medicine

## 2013-07-28 ENCOUNTER — Emergency Department (INDEPENDENT_AMBULATORY_CARE_PROVIDER_SITE_OTHER): Payer: Medicare Other

## 2013-07-28 DIAGNOSIS — J209 Acute bronchitis, unspecified: Secondary | ICD-10-CM

## 2013-07-28 HISTORY — DX: Sleep apnea, unspecified: G47.30

## 2013-07-28 HISTORY — DX: Essential (primary) hypertension: I10

## 2013-07-28 HISTORY — DX: Chronic obstructive pulmonary disease, unspecified: J44.9

## 2013-07-28 HISTORY — DX: Unspecified glaucoma: H40.9

## 2013-07-28 MED ORDER — GUAIFENESIN-CODEINE 100-10 MG/5ML PO SYRP: 5.0000 mL | ORAL_SOLUTION | Freq: Four times a day (QID) | ORAL | Status: DC | PRN

## 2013-07-28 MED ORDER — DOXYCYCLINE HYCLATE 100 MG PO TABS: 100.0000 mg | ORAL_TABLET | Freq: Two times a day (BID) | ORAL | Status: DC

## 2013-07-28 MED ORDER — PREDNISONE 20 MG PO TABS: 20.0000 mg | ORAL_TABLET | Freq: Two times a day (BID) | ORAL | Status: DC

## 2013-07-28 MED ORDER — ALBUTEROL SULFATE HFA 108 (90 BASE) MCG/ACT IN AERS: 2.0000 | INHALATION_SPRAY | Freq: Four times a day (QID) | RESPIRATORY_TRACT | Status: AC

## 2013-07-28 NOTE — ED Notes (Signed)
Patient complains of cough with chest congestion; states  Wheezing. Denies f/c.

## 2013-07-28 NOTE — Discharge Instructions (Signed)
Acute Bronchitis °Bronchitis is inflammation of the airways that extend from the windpipe into the lungs (bronchi). The inflammation often causes mucus to develop. This leads to a cough, which is the most common symptom of bronchitis.  °In acute bronchitis, the condition usually develops suddenly and goes away over time, usually in a couple weeks. Smoking, allergies, and asthma can make bronchitis worse. Repeated episodes of bronchitis may cause further lung problems.  °CAUSES °Acute bronchitis is most often caused by the same virus that causes a cold. The virus can spread from person to person (contagious).  °SIGNS AND SYMPTOMS  °· Cough.   °· Fever.   °· Coughing up mucus.   °· Body aches.   °· Chest congestion.   °· Chills.   °· Shortness of breath.   °· Sore throat.   °DIAGNOSIS  °Acute bronchitis is usually diagnosed through a physical exam. Tests, such as chest X-rays, are sometimes done to rule out other conditions.  °TREATMENT  °Acute bronchitis usually goes away in a couple weeks. Often times, no medical treatment is necessary. Medicines are sometimes given for relief of fever or cough. Antibiotics are usually not needed but may be prescribed in certain situations. In some cases, an inhaler may be recommended to help reduce shortness of breath and control the cough. A cool mist vaporizer may also be used to help thin bronchial secretions and make it easier to clear the chest.  °HOME CARE INSTRUCTIONS °· Get plenty of rest.   °· Drink enough fluids to keep your urine clear or pale yellow (unless you have a medical condition that requires fluid restriction). Increasing fluids may help thin your secretions and will prevent dehydration.   °· Only take over-the-counter or prescription medicines as directed by your health care provider.   °· Avoid smoking and secondhand smoke. Exposure to cigarette smoke or irritating chemicals will make bronchitis worse. If you are a smoker, consider using nicotine gum or skin  patches to help control withdrawal symptoms. Quitting smoking will help your lungs heal faster.   °· Reduce the chances of another bout of acute bronchitis by washing your hands frequently, avoiding people with cold symptoms, and trying not to touch your hands to your mouth, nose, or eyes.   °· Follow up with your health care provider as directed.   °SEEK MEDICAL CARE IF: °Your symptoms do not improve after 1 week of treatment.  °SEEK IMMEDIATE MEDICAL CARE IF: °· You develop an increased fever or chills.   °· You have chest pain.   °· You have severe shortness of breath. °· You have bloody sputum.   °· You develop dehydration. °· You develop fainting. °· You develop repeated vomiting. °· You develop a severe headache. °MAKE SURE YOU:  °· Understand these instructions. °· Will watch your condition. °· Will get help right away if you are not doing well or get worse. °Document Released: 03/10/2004 Document Revised: 10/03/2012 Document Reviewed: 07/24/2012 °ExitCare® Patient Information ©2014 [employer]. °How to Use an Inhaler °Proper inhaler technique is very important. Good technique ensures that the medicine reaches the lungs. Poor technique results in depositing the medicine on the tongue and back of the throat rather than in the airways. If you do not use the inhaler with good technique, the medicine will not help you. °STEPS TO FOLLOW IF USING AN INHALER WITHOUT AN EXTENSION TUBE °1. Remove the cap from the inhaler. °2. If you are using the inhaler for the first time, you will need to prime it. Shake the inhaler for 5 seconds and release four puffs into the air,   away from your face. Ask your health care provider or pharmacist if you have questions about priming your inhaler. °3. Shake the inhaler for 5 seconds before each breath in (inhalation). °4. Position the inhaler so that the top of the canister faces up. °5. Put your index finger on the top of the medicine canister. Your thumb supports the bottom of the  inhaler. °6. Open your mouth. °7. Either place the inhaler between your teeth and place your lips tightly around the mouthpiece, or hold the inhaler 1 2 inches away from your open mouth. If you are unsure of which technique to use, ask your health care provider. °8. Breathe out (exhale) normally and as completely as possible. °9. Press the canister down with your index finger to release the medicine. °10. At the same time as the canister is pressed, inhale deeply and slowly until your lungs are completely filled. This should take 4 6 seconds. Keep your tongue down. °11. Hold the medicine in your lungs for 5 10 seconds (10 seconds is best). This helps the medicine get into the small airways of your lungs. °12. Breathe out slowly, through pursed lips. Whistling is an example of pursed lips. °13. Wait at least 15 30 seconds between puffs. Continue with the above steps until you have taken the number of puffs your health care provider has ordered. Do not use the inhaler more than your health care provider tells you. °14. Replace the cap on the inhaler. °15. Follow the directions from your health care provider or the inhaler insert for cleaning the inhaler. °STEPS TO FOLLOW IF USING AN INHALER WITH AN EXTENSION (SPACER) °1. Remove the cap from the inhaler. °2. If you are using the inhaler for the first time, you will need to prime it. Shake the inhaler for 5 seconds and release four puffs into the air, away from your face. Ask your health care provider or pharmacist if you have questions about priming your inhaler. °3. Shake the inhaler for 5 seconds before each breath in (inhalation). °4. Place the open end of the spacer onto the mouthpiece of the inhaler. °5. Position the inhaler so that the top of the canister faces up and the spacer mouthpiece faces you. °6. Put your index finger on the top of the medicine canister. Your thumb supports the bottom of the inhaler and the spacer. °7. Breathe out (exhale) normally and as  completely as possible. °8. Immediately after exhaling, place the spacer between your teeth and into your mouth. Close your lips tightly around the spacer. °9. Press the canister down with your index finger to release the medicine. °10. At the same time as the canister is pressed, inhale deeply and slowly until your lungs are completely filled. This should take 4 6 seconds. Keep your tongue down and out of the way. °11. Hold the medicine in your lungs for 5 10 seconds (10 seconds is best). This helps the medicine get into the small airways of your lungs. Exhale. °12. Repeat inhaling deeply through the spacer mouthpiece. Again hold that breath for up to 10 seconds (10 seconds is best). Exhale slowly. If it is difficult to take this second deep breath through the spacer, breathe normally several times through the spacer. Remove the spacer from your mouth. °13. Wait at least 15 30 seconds between puffs. Continue with the above steps until you have taken the number of puffs your health care provider has ordered. Do not use the inhaler more than your health care   provider tells you. °14. Remove the spacer from the inhaler, and place the cap on the inhaler. °15. Follow the directions from your health care provider or the inhaler insert for cleaning the inhaler and spacer. °If you are using different kinds of inhalers, use your quick relief medicine to open the airways 10 15 minutes before using a steroid if instructed to do so by your health care provider. If you are unsure which inhalers to use and the order of using them, ask your health care provider, nurse, or respiratory therapist. °If you are using a steroid inhaler, always rinse your mouth with water after your last puff, then gargle and spit out the water. Do not swallow the water. °AVOID: °· Inhaling before or after starting the spray of medicine. It takes practice to coordinate your breathing with triggering the spray. °· Inhaling through the nose (rather than  the mouth) when triggering the spray. °HOW TO DETERMINE IF YOUR INHALER IS FULL OR NEARLY EMPTY °You cannot know when an inhaler is empty by shaking it. A few inhalers are now being made with dose counters. Ask your health care provider for a prescription that has a dose counter if you feel you need that extra help. If your inhaler does not have a counter, ask your health care provider to help you determine the date you need to refill your inhaler. Write the refill date on a calendar or your inhaler canister. Refill your inhaler 7 10 days before it runs out. Be sure to keep an adequate supply of medicine. This includes making sure it is not expired, and that you have a spare inhaler.  °SEEK MEDICAL CARE IF:  °· Your symptoms are only partially relieved with your inhaler. °· You are having trouble using your inhaler. °· You have some increase in phlegm. °SEEK IMMEDIATE MEDICAL CARE IF:  °· You feel little or no relief with your inhalers. You are still wheezing and are feeling shortness of breath or tightness in your chest or both. °· You have dizziness, headaches, or a fast heart rate. °· You have chills, fever, or night sweats. °· You have a noticeable increase in phlegm production, or there is blood in the phlegm. °MAKE SURE YOU:  °· Understand these instructions. °· Will watch your condition. °· Will get help right away if you are not doing well or get worse. °Document Released: 01/29/2000 Document Revised: 11/21/2012 Document Reviewed: 08/30/2012 °ExitCare® Patient Information ©2014 [employer]. ° °

## 2013-07-28 NOTE — ED Provider Notes (Signed)
Chief Complaint   Chief Complaint  Patient presents with  . URI    History of Present Illness   Dylan Harrington is a 73 year old male who has had a two-week history of cough productive yellow sputum, wheezing, and aching in his chest. He is smoking about a pack of cigarettes a day. He denies any fever or chills. No nasal congestion or rhinorrhea. No sore throat. He has a history of COPD. No history of asthma or pneumonia.  Review of Systems   Other than as noted above, the patient denies any of the following symptoms: Systemic:  No fevers, chills, sweats, or weight loss. ENT:  No nasal congestion, sneezing, itching, postnasal drip, sinus pressure, headache, sore throat, or hoarseness. Lungs:  No wheezing, shortness of breath, chest tightness or congestion. Heart:  No chest pain, tightness, pressure, PND, orthopnea, or ankle edema. GI:  No indigestion, heartburn, waterbrash, burping, abdominal pain, nausea, or vomiting.  PMFSH   Past medical history, family history, social history, meds, and allergies were reviewed.   Physical Examination     Vital signs:  BP 146/86  Pulse 96  Temp(Src) 98.5 F (36.9 C) (Oral)  Resp 20  SpO2 96% General:  Alert and oriented.  In no distress.  Skin warm and dry. ENT: TMs and ear canals normal.  Nasal mucosa normal, without drainage.  Pharynx clear without exudate or drainage.  No intraoral lesions. Neck:  No adenopathy, tenderness or mass.  No JVD. Lungs:  No respiratory distress.  Breath sounds clear and equal bilaterally.  No wheezes, rales or rhonchi. Heart:  Regular rhythm, no gallops or murmers.  No pedal edema. Abdomon:  Soft and nontender.  No organomegaly or mass.  Radiology   Dg Chest 2 View  07/28/2013   CLINICAL DATA:  Productive cough  EXAM: CHEST  2 VIEW  COMPARISON:  07/26/2008  FINDINGS: Cardiomediastinal silhouette is stable. Dual lead cardiac pacemaker is unchanged in position. No acute infiltrate or pleural effusion. No  pulmonary edema. Mild degenerative changes lower thoracic and lumbar spine.  IMPRESSION: No active disease.  Dual lead cardiac pacemaker in place.   Electronically Signed   By: Natasha MeadLiviu  Pop M.D.   On: 07/28/2013 10:34   Assessment   The encounter diagnosis was Acute bronchitis.  Plan     1.  Meds:  The following meds were prescribed:   Discharge Medication List as of 07/28/2013 10:41 AM    START taking these medications   Details  albuterol (PROVENTIL HFA;VENTOLIN HFA) 108 (90 BASE) MCG/ACT inhaler Inhale 2 puffs into the lungs 4 (four) times daily., Starting 07/28/2013, Until Discontinued, Normal    doxycycline (VIBRA-TABS) 100 MG tablet Take 1 tablet (100 mg total) by mouth 2 (two) times daily., Starting 07/28/2013, Until Discontinued, Normal    guaiFENesin-codeine (GUIATUSS AC) 100-10 MG/5ML syrup Take 5 mLs by mouth 4 (four) times daily as needed for cough., Starting 07/28/2013, Until Discontinued, Print    predniSONE (DELTASONE) 20 MG tablet Take 1 tablet (20 mg total) by mouth 2 (two) times daily., Starting 07/28/2013, Until Discontinued, Normal        2.  Patient Education/Counseling:  The patient was given appropriate handouts, self care instructions, and instructed in symptomatic relief.  She was strongly encouraged to quit smoking.  3.  Follow up:  The patient was told to follow up here if no better in one week, or sooner if becoming worse in any way, and given some red flag symptoms such as difficulty breathing  or chest pain which would prompt immediate return.         Reuben Likesavid C Rosilyn Coachman, MD 07/28/13 [phone]

## 2014-05-24 ENCOUNTER — Emergency Department (HOSPITAL_COMMUNITY)
Admission: EM | Admit: 2014-05-24 | Discharge: 2014-05-25 | Disposition: A | Discharge status: Home / Self Care | Payer: Medicare Other | Attending: Emergency Medicine | Admitting: Emergency Medicine

## 2014-05-24 ENCOUNTER — Encounter (HOSPITAL_COMMUNITY): Payer: Self-pay | Admitting: Emergency Medicine

## 2014-05-24 ENCOUNTER — Emergency Department (HOSPITAL_COMMUNITY): Payer: Medicare Other

## 2014-05-24 DIAGNOSIS — I10 Essential (primary) hypertension: Secondary | ICD-10-CM | POA: Insufficient documentation

## 2014-05-24 DIAGNOSIS — Z79899 Other long term (current) drug therapy: Secondary | ICD-10-CM | POA: Insufficient documentation

## 2014-05-24 DIAGNOSIS — H409 Unspecified glaucoma: Secondary | ICD-10-CM | POA: Diagnosis not present

## 2014-05-24 DIAGNOSIS — Z950 Presence of cardiac pacemaker: Secondary | ICD-10-CM | POA: Diagnosis not present

## 2014-05-24 DIAGNOSIS — J449 Chronic obstructive pulmonary disease, unspecified: Secondary | ICD-10-CM | POA: Insufficient documentation

## 2014-05-24 DIAGNOSIS — Z720 Tobacco use: Secondary | ICD-10-CM | POA: Diagnosis not present

## 2014-05-24 DIAGNOSIS — Z791 Long term (current) use of non-steroidal anti-inflammatories (NSAID): Secondary | ICD-10-CM | POA: Insufficient documentation

## 2014-05-24 DIAGNOSIS — R079 Chest pain, unspecified: Secondary | ICD-10-CM | POA: Diagnosis present

## 2014-05-24 DIAGNOSIS — R0789 Other chest pain: Secondary | ICD-10-CM | POA: Diagnosis not present

## 2014-05-24 HISTORY — DX: Presence of cardiac pacemaker: Z95.0

## 2014-05-24 LAB — COMPREHENSIVE METABOLIC PANEL WITH GFR
ALT: 27 U/L (ref 0–53)
AST: 29 U/L (ref 0–37)
Albumin: 3.5 g/dL (ref 3.5–5.2)
Alkaline Phosphatase: 63 U/L (ref 39–117)
Anion gap: 9 (ref 5–15)
BUN: 23 mg/dL (ref 6–23)
CO2: 31 mmol/L (ref 19–32)
Calcium: 9 mg/dL (ref 8.4–10.5)
Chloride: 100 mmol/L (ref 96–112)
Creatinine, Ser: 1.22 mg/dL (ref 0.50–1.35)
GFR calc Af Amer: 66 mL/min — ABNORMAL LOW
GFR calc non Af Amer: 57 mL/min — ABNORMAL LOW
Glucose, Bld: 203 mg/dL — ABNORMAL HIGH (ref 70–99)
Potassium: 4.2 mmol/L (ref 3.5–5.1)
Sodium: 140 mmol/L (ref 135–145)
Total Bilirubin: 1.1 mg/dL (ref 0.3–1.2)
Total Protein: 6.1 g/dL (ref 6.0–8.3)

## 2014-05-24 LAB — CBC WITH DIFFERENTIAL/PLATELET
Basophils Absolute: 0 K/uL (ref 0.0–0.1)
Basophils Relative: 0 % (ref 0–1)
Eosinophils Absolute: 0.2 K/uL (ref 0.0–0.7)
Eosinophils Relative: 2 % (ref 0–5)
HCT: 44.2 % (ref 39.0–52.0)
Hemoglobin: 15 g/dL (ref 13.0–17.0)
Lymphocytes Relative: 21 % (ref 12–46)
Lymphs Abs: 1.8 K/uL (ref 0.7–4.0)
MCH: 32.4 pg (ref 26.0–34.0)
MCHC: 33.9 g/dL (ref 30.0–36.0)
MCV: 95.5 fL (ref 78.0–100.0)
Monocytes Absolute: 0.9 K/uL (ref 0.1–1.0)
Monocytes Relative: 11 % (ref 3–12)
Neutro Abs: 5.9 K/uL (ref 1.7–7.7)
Neutrophils Relative %: 66 % (ref 43–77)
Platelets: 167 K/uL (ref 150–400)
RBC: 4.63 MIL/uL (ref 4.22–5.81)
RDW: 14 % (ref 11.5–15.5)
WBC: 8.8 K/uL (ref 4.0–10.5)

## 2014-05-24 LAB — I-STAT TROPONIN, ED: Troponin i, poc: 0.01 ng/mL (ref 0.00–0.08)

## 2014-05-24 NOTE — ED Provider Notes (Signed)
CSN: [id number]     Arrival date & time 05/24/14  2025 History   First MD Initiated Contact with Patient 05/24/14 2048     Chief Complaint  Patient presents with  . Chest Pain     (Consider location/radiation/quality/duration/timing/severity/associated sxs/prior Treatment) The history is provided by the patient.  Arville LimeMelvin E Bracewell is a 74 y.o. male hx of HTN, COPD, pacemaker for tachybradycardia syndrome who presenting with chest pain. Patient was mowing the lawn earlier today. Ate dinner around 7 PM and then had started onset of left-sided chest pain that radiated to his jaw. Denies any shortness of breath. He has a pacemaker but denies any cardiac stents. Given ASA 325 mg and nitro x 3 by EMS and now pain free.    Past Medical History  Diagnosis Date  . Hypertension   . Glaucoma   . COPD (chronic obstructive pulmonary disease)   . Sleep apnea   . Pacemaker    Past Surgical History  Procedure Laterality Date  . Pacemaker insertion    . Medial partial knee replacement    . Shoulder surgery    . Spine surgery    . Kidney stone surgery    . Stents     No family history on file. History  Substance Use Topics  . Smoking status: Heavy Tobacco Smoker -- 0.50 packs/day    Types: Cigarettes  . Smokeless tobacco: Not on file  . Alcohol Use: 0.6 oz/week    1 Shots of liquor per week     Comment: at least 4 oz a day    Review of Systems  Cardiovascular: Positive for chest pain.  All other systems reviewed and are negative.     Allergies  Keflex  Home Medications   Prior to Admission medications   Medication Sig Start Date End Date Taking? Authorizing Provider  lisinopril-hydrochlorothiazide (PRINZIDE,ZESTORETIC) 20-25 MG per tablet Take 1 tablet by mouth daily.   Yes Historical Provider, MD  metoprolol (LOPRESSOR) 50 MG tablet Take 25 mg by mouth daily.    Yes Historical Provider, MD  zolpidem (AMBIEN) 5 MG tablet Take 10 mg by mouth at bedtime.    Yes Historical  Provider, MD  albuterol (PROVENTIL HFA;VENTOLIN HFA) 108 (90 BASE) MCG/ACT inhaler Inhale 2 puffs into the lungs 4 (four) times daily. Patient not taking: Reported on 05/24/2014 07/28/13   Reuben Likesavid C Keller, MD  doxycycline (VIBRA-TABS) 100 MG tablet Take 1 tablet (100 mg total) by mouth 2 (two) times daily. Patient not taking: Reported on 05/24/2014 07/28/13   Reuben Likesavid C Keller, MD  guaiFENesin-codeine [facility](GUIATUSS AC) 100-10 MG/5ML syrup Take 5 mLs by mouth 4 (four) times daily as needed for cough. Patient not taking: Reported on 05/24/2014 07/28/13   Reuben Likesavid C Keller, MD  naproxen (NAPROSYN) 500 MG tablet Take 500 mg by mouth 2 (two) times daily with a meal.    Historical Provider, MD  predniSONE (DELTASONE) 20 MG tablet Take 1 tablet (20 mg total) by mouth 2 (two) times daily. Patient not taking: Reported on 05/24/2014 07/28/13   Reuben Likesavid C Keller, MD   BP 109/63 mmHg  Pulse 65  Temp(Src) 98 F (36.7 C) (Oral)  Resp 31  Ht 6' (1.829 m)  Wt 210 lb (95.255 kg)  BMI 28.47 kg/m2  SpO2 96% Physical Exam  Constitutional: He is oriented to person, place, and time. He appears well-developed and well-nourished.  HENT:  Head: Normocephalic.  Mouth/Throat: Oropharynx is clear and moist.  Eyes: Conjunctivae are normal. Pupils are  equal, round, and reactive to light.  Neck: Normal range of motion. Neck supple.  Cardiovascular: Normal rate, regular rhythm and normal heart sounds.   Pulmonary/Chest: Effort normal and breath sounds normal. No respiratory distress. He has no wheezes. He has no rales. He exhibits no tenderness.  Abdominal: Soft. Bowel sounds are normal. He exhibits no distension. There is no tenderness. There is no rebound and no guarding.  Musculoskeletal: Normal range of motion. He exhibits no edema or tenderness.  Neurological: He is alert and oriented to person, place, and time. No cranial nerve deficit. Coordination normal.  Skin: Skin is warm and dry.  Psychiatric: He has a normal mood and affect.  His behavior is normal. Judgment and thought content normal.  Nursing note and vitals reviewed.   ED Course  Procedures (including critical care time) Labs Review Labs Reviewed  COMPREHENSIVE METABOLIC PANEL - Abnormal; Notable for the following:    Glucose, Bld 203 (*)    GFR calc non Af Amer 57 (*)    GFR calc Af Amer 66 (*)    All other components within normal limits  CBC WITH DIFFERENTIAL/PLATELET  Rosezena Sensor, ED    Imaging Review Dg Chest 2 View  05/24/2014   CLINICAL DATA:  Acute chest pain.  EXAM: CHEST  2 VIEW  COMPARISON:  July 28, 2013.  FINDINGS: The heart size and mediastinal contours are within normal limits. Both lungs are clear. No pneumothorax or pleural effusion is noted. Left-sided pacemaker is stable in position. The visualized skeletal structures are unremarkable.  IMPRESSION: No active cardiopulmonary disease.   Electronically Signed   By: Lupita Raider, M.D.   On: 05/24/2014 22:07     EKG Interpretation   Date/Time:  Saturday May 24 2014 20:30:48 EDT Ventricular Rate:  99 PR Interval:  153 QRS Duration: 91 QT Interval:  350 QTC Calculation: 449 R Axis:   32 Text Interpretation:  Sinus tachycardia Atrial premature complex  Borderline low voltage, extremity leads Abnormal R-wave progression, early  transition previous tracing was paced  Confirmed by Lura Falor  MD, Amond Speranza ([id number])  on 05/24/2014 8:41:24 PM      MDM   Final diagnoses:  None   SHIRLEY BOLLE is a 74 y.o. male here with chest pain. Consider reflux vs ACS. Will get delta trop and observe closely. I doubt dissection or PE.   11:45 PM Pain free. Trop neg x 1. Offered admission but refused. Will get trop at 1 am if pain free and trop neg, will dc home. Signed out to Dr. Hyacinth Meeker.    Richardean Canal, MD 05/24/14 [phone]

## 2014-05-24 NOTE — ED Notes (Signed)
BP noted to be low.  In room to assess patient.  Readjusted cuff and placed arm beside patient on the bed off the rail.  BP much better on repeat.  States I feel good i am still not having any pain.  Family left to return if patient is discharged.

## 2014-05-24 NOTE — Discharge Instructions (Signed)
Avoid too much exertion.   Follow up with cardiology.   Return to ER if you have severe chest pain, shortness of breath.

## 2014-05-24 NOTE — ED Notes (Signed)
Started having sudden substernal chest pain that radiated to the left jaw around 7 pm.  States i was just watching tv when it started.  Hx of Afib, pacer, a few stents.  Now rating pain at 5/10 after receiving ASA 324mg  and NTG SL X 3 enroute.  States the pain is only in my chest now.

## 2014-05-25 LAB — I-STAT TROPONIN, ED: Troponin i, poc: 0.01 ng/mL (ref 0.00–0.08)

## 2014-08-11 ENCOUNTER — Other Ambulatory Visit: Payer: Self-pay

## 2014-08-20 ENCOUNTER — Encounter (HOSPITAL_COMMUNITY): Payer: Self-pay | Admitting: Emergency Medicine

## 2014-08-20 DIAGNOSIS — R109 Unspecified abdominal pain: Secondary | ICD-10-CM | POA: Diagnosis present

## 2014-08-20 DIAGNOSIS — Z792 Long term (current) use of antibiotics: Secondary | ICD-10-CM | POA: Insufficient documentation

## 2014-08-20 DIAGNOSIS — Z7952 Long term (current) use of systemic steroids: Secondary | ICD-10-CM | POA: Insufficient documentation

## 2014-08-20 DIAGNOSIS — Z79899 Other long term (current) drug therapy: Secondary | ICD-10-CM | POA: Insufficient documentation

## 2014-08-20 DIAGNOSIS — Z87440 Personal history of urinary (tract) infections: Secondary | ICD-10-CM | POA: Diagnosis not present

## 2014-08-20 DIAGNOSIS — N200 Calculus of kidney: Secondary | ICD-10-CM | POA: Insufficient documentation

## 2014-08-20 DIAGNOSIS — Z791 Long term (current) use of non-steroidal anti-inflammatories (NSAID): Secondary | ICD-10-CM | POA: Diagnosis not present

## 2014-08-20 DIAGNOSIS — Z720 Tobacco use: Secondary | ICD-10-CM | POA: Insufficient documentation

## 2014-08-20 DIAGNOSIS — Z950 Presence of cardiac pacemaker: Secondary | ICD-10-CM | POA: Insufficient documentation

## 2014-08-20 DIAGNOSIS — Z8669 Personal history of other diseases of the nervous system and sense organs: Secondary | ICD-10-CM | POA: Insufficient documentation

## 2014-08-20 DIAGNOSIS — I10 Essential (primary) hypertension: Secondary | ICD-10-CM | POA: Insufficient documentation

## 2014-08-20 DIAGNOSIS — J441 Chronic obstructive pulmonary disease with (acute) exacerbation: Secondary | ICD-10-CM | POA: Diagnosis not present

## 2014-08-20 DIAGNOSIS — Z9981 Dependence on supplemental oxygen: Secondary | ICD-10-CM | POA: Diagnosis not present

## 2014-08-20 DIAGNOSIS — G4733 Obstructive sleep apnea (adult) (pediatric): Secondary | ICD-10-CM | POA: Diagnosis not present

## 2014-08-20 NOTE — ED Notes (Signed)
Pt refused wheelchair in triage.

## 2014-08-20 NOTE — ED Notes (Signed)
Pt. reports right flank pain radiating to groin onset 2 weeks ago with dysuria pt. stated pain similar to kidney stone in the past . Denies hematuria , no fever or emesis .

## 2014-08-21 ENCOUNTER — Emergency Department (HOSPITAL_COMMUNITY)
Admission: EM | Admit: 2014-08-21 | Discharge: 2014-08-21 | Disposition: A | Discharge status: Home / Self Care | Payer: Medicare Other | Attending: Emergency Medicine | Admitting: Emergency Medicine

## 2014-08-21 ENCOUNTER — Encounter (HOSPITAL_COMMUNITY): Payer: Self-pay | Admitting: Radiology

## 2014-08-21 ENCOUNTER — Emergency Department (HOSPITAL_COMMUNITY): Payer: Medicare Other

## 2014-08-21 DIAGNOSIS — R109 Unspecified abdominal pain: Secondary | ICD-10-CM

## 2014-08-21 DIAGNOSIS — N200 Calculus of kidney: Secondary | ICD-10-CM

## 2014-08-21 HISTORY — DX: Disorder of kidney and ureter, unspecified: N28.9

## 2014-08-21 LAB — COMPREHENSIVE METABOLIC PANEL
ALK PHOS: 64 U/L (ref 38–126)
ALT: 29 U/L (ref 17–63)
AST: 32 U/L (ref 15–41)
Albumin: 3.9 g/dL (ref 3.5–5.0)
Anion gap: 11 (ref 5–15)
BILIRUBIN TOTAL: 1 mg/dL (ref 0.3–1.2)
BUN: 22 mg/dL — ABNORMAL HIGH (ref 6–20)
CO2: 21 mmol/L — ABNORMAL LOW (ref 22–32)
CREATININE: 1.67 mg/dL — AB (ref 0.61–1.24)
Calcium: 8.9 mg/dL (ref 8.9–10.3)
Chloride: 99 mmol/L — ABNORMAL LOW (ref 101–111)
GFR calc Af Amer: 45 mL/min — ABNORMAL LOW (ref >60)
GFR calc non Af Amer: 39 mL/min — ABNORMAL LOW (ref >60)
Glucose, Bld: 121 mg/dL — ABNORMAL HIGH (ref 65–99)
Potassium: 3.9 mmol/L (ref 3.5–5.1)
Sodium: 131 mmol/L — ABNORMAL LOW (ref 135–145)
Total Protein: 6.8 g/dL (ref 6.5–8.1)

## 2014-08-21 LAB — URINALYSIS, ROUTINE W REFLEX MICROSCOPIC
GLUCOSE, UA: NEGATIVE mg/dL
KETONES UR: 15 mg/dL — AB
Nitrite: POSITIVE — AB
PROTEIN: 30 mg/dL — AB
Specific Gravity, Urine: 1.017 (ref 1.005–1.030)
UROBILINOGEN UA: 2 mg/dL — AB (ref 0.0–1.0)
pH: 5 (ref 5.0–8.0)

## 2014-08-21 LAB — URINE MICROSCOPIC-ADD ON

## 2014-08-21 LAB — CBC
HCT: 48.6 % (ref 39.0–52.0)
Hemoglobin: 17 g/dL (ref 13.0–17.0)
MCH: 32.6 pg (ref 26.0–34.0)
MCHC: 35 g/dL (ref 30.0–36.0)
MCV: 93.1 fL (ref 78.0–100.0)
PLATELETS: 182 10*3/uL (ref 150–400)
RBC: 5.22 MIL/uL (ref 4.22–5.81)
RDW: 13.6 % (ref 11.5–15.5)
WBC: 9.7 10*3/uL (ref 4.0–10.5)

## 2014-08-21 MED ORDER — ALBUTEROL SULFATE HFA 108 (90 BASE) MCG/ACT IN AERS
1.0000 | INHALATION_SPRAY | RESPIRATORY_TRACT | Status: DC | PRN
  Administered 2014-08-21: 2 via RESPIRATORY_TRACT
  Filled 2014-08-21: qty 6.7

## 2014-08-21 MED ORDER — TAMSULOSIN HCL 0.4 MG PO CAPS
0.4000 mg | ORAL_CAPSULE | ORAL | Status: AC
  Administered 2014-08-21: 0.4 mg via ORAL
  Filled 2014-08-21: qty 1

## 2014-08-21 MED ORDER — IPRATROPIUM-ALBUTEROL 0.5-2.5 (3) MG/3ML IN SOLN
3.0000 mL | Freq: Once | RESPIRATORY_TRACT | Status: AC
  Administered 2014-08-21: 3 mL via RESPIRATORY_TRACT
  Filled 2014-08-21: qty 3

## 2014-08-21 MED ORDER — FENTANYL CITRATE (PF) 100 MCG/2ML IJ SOLN
50.0000 ug | Freq: Once | INTRAMUSCULAR | Status: AC
  Administered 2014-08-21: 50 ug via INTRAVENOUS
  Filled 2014-08-21: qty 2

## 2014-08-21 MED ORDER — DOCUSATE SODIUM 100 MG PO CAPS: 100.0000 mg | ORAL_CAPSULE | Freq: Two times a day (BID) | ORAL | Status: AC

## 2014-08-21 MED ORDER — ONDANSETRON 8 MG PO TBDP: 8.0000 mg | ORAL_TABLET | Freq: Three times a day (TID) | ORAL | Status: DC | PRN

## 2014-08-21 MED ORDER — TAMSULOSIN HCL 0.4 MG PO CAPS: 0.4000 mg | ORAL_CAPSULE | Freq: Every day | ORAL | Status: AC

## 2014-08-21 MED ORDER — SODIUM CHLORIDE 0.9 % IV BOLUS (SEPSIS)
500.0000 mL | Freq: Once | INTRAVENOUS | Status: AC
  Administered 2014-08-21: 500 mL via INTRAVENOUS

## 2014-08-21 MED ORDER — OXYCODONE-ACETAMINOPHEN 5-325 MG PO TABS: 2.0000 | ORAL_TABLET | ORAL | Status: DC | PRN

## 2014-08-21 MED ORDER — TAMSULOSIN HCL 0.4 MG PO CAPS: 0.4000 mg | ORAL_CAPSULE | Freq: Every day | ORAL | Status: DC

## 2014-08-21 MED ORDER — MORPHINE SULFATE 4 MG/ML IJ SOLN
4.0000 mg | Freq: Once | INTRAMUSCULAR | Status: AC
  Administered 2014-08-21: 4 mg via INTRAVENOUS
  Filled 2014-08-21: qty 1

## 2014-08-21 NOTE — ED Notes (Signed)
Pt stats drop to 88% on room air

## 2014-08-21 NOTE — Discharge Instructions (Signed)
You have a 7mm x 4 mm mm stone on the right about to pass into your bladder.  This may pass on its own.  Drink plenty of fluids.  Take pain and nausea medications as prescribed.  Return to the ER for worsening pain, or nausea despite medications, fever, or other new concerning symptoms.  Follow up with urology as listed above.  Your CT scan  also shows another stones in right kidney that may cause problems in the future.  If you have problems with kidney stones either with your current stone or with future stones, the urology group prefers that you be seen at the [facility] versus [facility]  They are better equipped to handle complications at the [facility].     Kidney Stones Kidney stones (urolithiasis) are deposits that form inside your kidneys. The intense pain is caused by the stone moving through the urinary tract. When the stone moves, the ureter goes into spasm around the stone. The stone is usually passed in the urine.  CAUSES   A disorder that makes certain neck glands produce too much parathyroid hormone (primary hyperparathyroidism).  A buildup of uric acid crystals, similar to gout in your joints.  Narrowing (stricture) of the ureter.  A kidney obstruction present at birth (congenital obstruction).  Previous surgery on the kidney or ureters.  Numerous kidney infections. SYMPTOMS   Feeling sick to your stomach (nauseous).  Throwing up (vomiting).  Blood in the urine (hematuria).  Pain that usually spreads (radiates) to the groin.  Frequency or urgency of urination. DIAGNOSIS   Taking a history and physical exam.  Blood or urine tests.  CT scan.  Occasionally, an examination of the inside of the urinary bladder (cystoscopy) is performed. TREATMENT   Observation.  Increasing your fluid intake.  Extracorporeal shock wave lithotripsy--This is a noninvasive procedure that uses shock waves to break up kidney stones.  Surgery may be needed if you have  severe pain or persistent obstruction. There are various surgical procedures. Most of the procedures are performed with the use of small instruments. Only small incisions are needed to accommodate these instruments, so recovery time is minimized. The size, location, and chemical composition are all important variables that will determine the proper choice of action for you. Talk to your health care provider to better understand your situation so that you will minimize the risk of injury to yourself and your kidney.  HOME CARE INSTRUCTIONS   Drink enough water and fluids to keep your urine clear or pale yellow. This will help you to pass the stone or stone fragments.  Strain all urine through the provided strainer. Keep all particulate matter and stones for your health care provider to see. The stone causing the pain may be as small as a grain of salt. It is very important to use the strainer each and every time you pass your urine. The collection of your stone will allow your health care provider to analyze it and verify that a stone has actually passed. The stone analysis will often identify what you can do to reduce the incidence of recurrences.  Only take over-the-counter or prescription medicines for pain, discomfort, or fever as directed by your health care provider.  Make a follow-up appointment with your health care provider as directed.  Get follow-up X-rays if required. The absence of pain does not always mean that the stone has passed. It may have only stopped moving. If the urine remains completely obstructed, it  can cause loss of kidney function or even complete destruction of the kidney. It is your responsibility to make sure X-rays and follow-ups are completed. Ultrasounds of the kidney can show blockages and the status of the kidney. Ultrasounds are not associated with any radiation and can be performed easily in a matter of minutes. SEEK MEDICAL CARE IF:  You experience pain that is  progressive and unresponsive to any pain medicine you have been prescribed. SEEK IMMEDIATE MEDICAL CARE IF:   Pain cannot be controlled with the prescribed medicine.  You have a fever or shaking chills.  The severity or intensity of pain increases over 18 hours and is not relieved by pain medicine.  You develop a new onset of abdominal pain.  You feel faint or pass out.  You are unable to urinate. MAKE SURE YOU:   Understand these instructions.  Will watch your condition.  Will get help right away if you are not doing well or get worse. Document Released: 01/31/2005 Document Revised: 10/03/2012 Document Reviewed: 07/04/2012 [facility] Patient Information 2015 [place], [place]. This information is not intended to replace advice given to you by your health care provider. Make sure you discuss any questions you have with your health care provider.

## 2014-08-21 NOTE — ED Notes (Addendum)
O2 noted to be at 91%. Placed pt on 2L O2.

## 2014-08-21 NOTE — ED Provider Notes (Signed)
CSN: [id number]     Arrival date & time 08/20/14  2334 History  This chart was scribed for  Dylan Severin, MD by [name], ED Scribe. This patient was seen in room A03C/A03C and the patient's care was started at 12:46 AM.    Chief Complaint  Patient presents with  . Flank Pain   The history is provided by the patient and the spouse. No language interpreter was used.   Dylan Harrington is a 74 y.o. male with PMHx of kidney stones, COPD, and HTN who presents to the Emergency Department complaining of increasing right sided flank pain with onset 2 weeks ago. The pain has been constant today and radiates to the groin. Pt rates the pain 8/10 in severity and describes it as aching. He had similar pain with a kidney stone 5-6 years ago. He has had 2 surgeries for kidney stones and estimates that he has passed approximately 10 total. Pt has been on Cipro (prescribed from [name]) for 2 weeks for a UTI but has had no relief. He denies fever and hematuria.   Past Medical History  Diagnosis Date  . Hypertension   . Glaucoma   . COPD (chronic obstructive pulmonary disease)   . Sleep apnea   . Pacemaker   . Renal disorder    Past Surgical History  Procedure Laterality Date  . Pacemaker insertion    . Medial partial knee replacement    . Shoulder surgery    . Spine surgery    . Kidney stone surgery    . Stents     No family history on file. History  Substance Use Topics  . Smoking status: Heavy Tobacco Smoker -- 0.00 packs/day    Types: Cigarettes  . Smokeless tobacco: Not on file  . Alcohol Use: Yes    Review of Systems  Constitutional: Negative for fever, activity change, appetite change and fatigue.  HENT: Negative for congestion, facial swelling, rhinorrhea and trouble swallowing.   Eyes: Negative for photophobia and pain.  Respiratory: Negative for cough, chest tightness and shortness of breath.   Cardiovascular: Negative for chest pain and leg swelling.   Gastrointestinal: Negative for nausea, vomiting, abdominal pain, diarrhea and constipation.  Endocrine: Negative for polydipsia and polyuria.  Genitourinary: Positive for flank pain. Negative for dysuria, urgency, decreased urine volume and difficulty urinating.  Musculoskeletal: Negative for back pain and gait problem.  Skin: Negative for color change, rash and wound.  Allergic/Immunologic: Negative for immunocompromised state.  Neurological: Negative for dizziness, facial asymmetry, speech difficulty, weakness, numbness and headaches.  Psychiatric/Behavioral: Negative for confusion, decreased concentration and agitation.      Allergies  Keflex  Home Medications   Prior to Admission medications   Medication Sig Start Date End Date Taking? Authorizing Provider  lisinopril-hydrochlorothiazide (PRINZIDE,ZESTORETIC) 20-25 MG per tablet Take 1 tablet by mouth daily.   Yes Historical Provider, MD  metoprolol (LOPRESSOR) 50 MG tablet Take 25 mg by mouth 2 (two) times daily.    Yes Historical Provider, MD  naproxen (NAPROSYN) 500 MG tablet Take 500 mg by mouth 2 (two) times daily with a meal.   Yes Historical Provider, MD  albuterol (PROVENTIL HFA;VENTOLIN HFA) 108 (90 BASE) MCG/ACT inhaler Inhale 2 puffs into the lungs 4 (four) times daily. Patient not taking: Reported on 05/24/2014 07/28/13   Dylan Likes, MD  doxycycline (VIBRA-TABS) 100 MG tablet Take 1 tablet (100 mg total) by mouth 2 (two) times daily. Patient not taking: Reported on  05/24/2014 07/28/13   Dylan Likes, MD  guaiFENesin-codeine [facility]) 100-10 MG/5ML syrup Take 5 mLs by mouth 4 (four) times daily as needed for cough. Patient not taking: Reported on 05/24/2014 07/28/13   Dylan Likes, MD  predniSONE (DELTASONE) 20 MG tablet Take 1 tablet (20 mg total) by mouth 2 (two) times daily. Patient not taking: Reported on 05/24/2014 07/28/13   Dylan Likes, MD   Triage Vitals: BP 144/90 mmHg  Pulse 92  Temp(Src) 98.1 F (36.7  C) (Oral)  Resp 24  Ht 6' (1.829 m)  Wt 213 lb (96.616 kg)  BMI 28.88 kg/m2 Physical Exam  Constitutional: He is oriented to person, place, and time. He appears well-developed and well-nourished. He appears distressed.  HENT:  Head: Normocephalic and atraumatic.  Nose: Nose normal.  Mouth/Throat: Oropharynx is clear and moist.  Eyes: Conjunctivae and EOM are normal. Pupils are equal, round, and reactive to light.  Neck: Normal range of motion. Neck supple. No JVD present. No tracheal deviation present. No thyromegaly present.  Cardiovascular: Normal rate, regular rhythm, normal heart sounds and intact distal pulses.  Exam reveals no gallop and no friction rub.   No murmur heard. Pulmonary/Chest: Effort normal. No stridor. No respiratory distress. He has wheezes. He has no rales. He exhibits no tenderness.  Abdominal: Soft. Bowel sounds are normal. He exhibits no distension and no mass. There is no tenderness. There is no rebound and no guarding.  Musculoskeletal: Normal range of motion. He exhibits no edema or tenderness.  Lymphadenopathy:    He has no cervical adenopathy.  Neurological: He is alert and oriented to person, place, and time. He displays normal reflexes. He exhibits normal muscle tone. Coordination normal.  Skin: Skin is warm and dry. No rash noted. No erythema. No pallor.  Psychiatric: He has a normal mood and affect. His behavior is normal. Judgment and thought content normal.  Nursing note and vitals reviewed.   ED Course  Procedures   DIAGNOSTIC STUDIES: Oxygen Saturation is 93% on 2 L [place], adequate by my interpretation.    COORDINATION OF CARE: 12:48 AM Discussed treatment plan which includes lab work, CT renal stone, fentanyl, and IVF with pt at bedside and pt agreed to plan.  Labs Review Labs Reviewed  COMPREHENSIVE METABOLIC PANEL - Abnormal; Notable for the following:    Sodium 131 (*)    Chloride 99 (*)    CO2 21 (*)    Glucose, Bld 121 (*)    BUN 22  (*)    Creatinine, Ser 1.67 (*)    GFR calc non Af Amer 39 (*)    GFR calc Af Amer 45 (*)    All other components within normal limits  URINALYSIS, ROUTINE W REFLEX MICROSCOPIC (NOT AT [facility]) - Abnormal; Notable for the following:    Color, Urine ORANGE (*)    APPearance CLOUDY (*)    Hgb urine dipstick LARGE (*)    Bilirubin Urine SMALL (*)    Ketones, ur 15 (*)    Protein, ur 30 (*)    Urobilinogen, UA 2.0 (*)    Nitrite POSITIVE (*)    Leukocytes, UA SMALL (*)    All other components within normal limits  URINE MICROSCOPIC-ADD ON - Abnormal; Notable for the following:    Squamous Epithelial / LPF FEW (*)    Bacteria, UA MANY (*)    Casts HYALINE CASTS (*)    All other components within normal limits  URINE CULTURE  CBC  Imaging Review Ct Renal Stone Study  08/21/2014   CLINICAL DATA:  Right flank pain  EXAM: CT ABDOMEN AND PELVIS WITHOUT CONTRAST  TECHNIQUE: Multidetector CT imaging of the abdomen and pelvis was performed following the standard protocol without IV contrast.  COMPARISON:  Abdominal MRI 05/30/2008  FINDINGS: BODY WALL: Fatty enlargement of the bilateral inguinal canals.  LOWER CHEST: Extensive coronary atherosclerosis. Dual-chamber pacer is present.  ABDOMEN/PELVIS:  Liver: Hepatic steatosis. A sub cm low-density in the left liver on image 21 is too small to characterize.  Biliary: No evidence of biliary obstruction or stone.  Pancreas: Unremarkable.  Spleen: Unremarkable.  Adrenals: Unremarkable.  Kidneys and ureters: Right hydroureteronephrosis secondary to a 7 x 4 mm stone at the right ureteral vesicular junction. There is associated right perinephric stranding and renal enlargement. 6 mm nonobstructing right renal calculus.  There are right renal cysts with layering calcification. No left-sided hydronephrosis or nephrolithiasis. Excavation along the interpolar left renal cortex is stable from 2010 renal MRI.  Bladder: Unremarkable.  Reproductive: Symmetric enlargement  of the prostate.  Bowel: No obstruction. No appendicitis.  Retroperitoneum: No mass or adenopathy.  Peritoneum: No ascites or pneumoperitoneum.  Vascular: Diffuse and bulky atherosclerotic calcification of the aorta and branch vessels.  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  IMPRESSION: 1. Obstructing 7 x 4 mm stone at the right ureteral vesicular junction. 2. Right nephrolithiasis. 3. Hepatic steatosis.   Electronically Signed   By: Marnee SpringJonathon  Watts M.D.   On: 08/21/2014 02:19     EKG Interpretation None      MDM   Final diagnoses:  Right flank pain  Kidney stone on right side    I personally performed the services described in this documentation, which was scribed in my presence. The recorded information has been reviewed and is accurate.  Pt with right flank pain, recently tx for UTI with cipro, worsening flank pain similar to prior stones.  7x4 stone at right UVJ.  Pain controlled here.  Plan for f/u with urology, strain urine, flomax.    Patient with wheezing, given DuoNeb.  When ambulating, he has noted drop his oxygen saturations.  Patient is refusing to stay for further evaluation for COPD.  He's been given albuterol inhaler and instructed to follow-up with his primary care doctor this week for further evaluation.  Dylan Severinlga Allessandra Bernardi, MD 08/21/14 [phone]

## 2014-08-21 NOTE — ED Notes (Signed)
Pt unable to sign esignatue due to computers down, pt understands discharge instructions and has not further questions.

## 2014-08-22 LAB — URINE CULTURE

## 2016-03-14 ENCOUNTER — Encounter (HOSPITAL_COMMUNITY): Payer: Self-pay | Admitting: Emergency Medicine

## 2016-03-14 ENCOUNTER — Emergency Department (HOSPITAL_COMMUNITY): Payer: Medicare HMO

## 2016-03-14 ENCOUNTER — Observation Stay (HOSPITAL_COMMUNITY)
Admission: EM | Admit: 2016-03-14 | Discharge: 2016-03-15 | Disposition: A | Discharge status: Home / Self Care | Payer: Medicare HMO | Attending: Family Medicine | Admitting: Family Medicine

## 2016-03-14 DIAGNOSIS — R69 Illness, unspecified: Secondary | ICD-10-CM

## 2016-03-14 DIAGNOSIS — Z79899 Other long term (current) drug therapy: Secondary | ICD-10-CM | POA: Insufficient documentation

## 2016-03-14 DIAGNOSIS — J9601 Acute respiratory failure with hypoxia: Secondary | ICD-10-CM

## 2016-03-14 DIAGNOSIS — J441 Chronic obstructive pulmonary disease with (acute) exacerbation: Secondary | ICD-10-CM | POA: Diagnosis not present

## 2016-03-14 DIAGNOSIS — I10 Essential (primary) hypertension: Secondary | ICD-10-CM | POA: Diagnosis not present

## 2016-03-14 DIAGNOSIS — G4733 Obstructive sleep apnea (adult) (pediatric): Secondary | ICD-10-CM | POA: Diagnosis not present

## 2016-03-14 DIAGNOSIS — R509 Fever, unspecified: Secondary | ICD-10-CM | POA: Diagnosis present

## 2016-03-14 DIAGNOSIS — F17200 Nicotine dependence, unspecified, uncomplicated: Secondary | ICD-10-CM | POA: Diagnosis not present

## 2016-03-14 DIAGNOSIS — J449 Chronic obstructive pulmonary disease, unspecified: Secondary | ICD-10-CM | POA: Diagnosis present

## 2016-03-14 DIAGNOSIS — Z720 Tobacco use: Secondary | ICD-10-CM

## 2016-03-14 DIAGNOSIS — Z23 Encounter for immunization: Secondary | ICD-10-CM | POA: Insufficient documentation

## 2016-03-14 DIAGNOSIS — J111 Influenza due to unidentified influenza virus with other respiratory manifestations: Principal | ICD-10-CM | POA: Insufficient documentation

## 2016-03-14 DIAGNOSIS — F17210 Nicotine dependence, cigarettes, uncomplicated: Secondary | ICD-10-CM | POA: Insufficient documentation

## 2016-03-14 DIAGNOSIS — Z950 Presence of cardiac pacemaker: Secondary | ICD-10-CM | POA: Insufficient documentation

## 2016-03-14 LAB — BLOOD GAS, ARTERIAL
ACID-BASE DEFICIT: 1.8 mmol/L (ref 0.0–2.0)
BICARBONATE: 21 mmol/L (ref 20.0–28.0)
Drawn by: 441261
FIO2: 21
O2 SAT: 93.6 %
PCO2 ART: 31.3 mmHg — AB (ref 32.0–48.0)
Patient temperature: 98.6
pH, Arterial: 7.442 (ref 7.350–7.450)
pO2, Arterial: 69.6 mmHg — ABNORMAL LOW (ref 83.0–108.0)

## 2016-03-14 LAB — INFLUENZA PANEL BY PCR (TYPE A & B)
Influenza A By PCR: POSITIVE — AB
Influenza B By PCR: NEGATIVE

## 2016-03-14 LAB — URINALYSIS, ROUTINE W REFLEX MICROSCOPIC
BILIRUBIN URINE: NEGATIVE
Bacteria, UA: NONE SEEN
GLUCOSE, UA: NEGATIVE mg/dL
KETONES UR: NEGATIVE mg/dL
LEUKOCYTES UA: NEGATIVE
NITRITE: NEGATIVE
PH: 6 (ref 5.0–8.0)
Protein, ur: NEGATIVE mg/dL
Specific Gravity, Urine: 1.019 (ref 1.005–1.030)

## 2016-03-14 LAB — COMPREHENSIVE METABOLIC PANEL
ALK PHOS: 45 U/L (ref 38–126)
ALT: 26 U/L (ref 17–63)
ANION GAP: 8 (ref 5–15)
AST: 50 U/L — ABNORMAL HIGH (ref 15–41)
Albumin: 3.5 g/dL (ref 3.5–5.0)
BILIRUBIN TOTAL: 1.3 mg/dL — AB (ref 0.3–1.2)
BUN: 18 mg/dL (ref 6–20)
CALCIUM: 8 mg/dL — AB (ref 8.9–10.3)
CO2: 24 mmol/L (ref 22–32)
Chloride: 103 mmol/L (ref 101–111)
Creatinine, Ser: 1.06 mg/dL (ref 0.61–1.24)
GFR calc Af Amer: >60 mL/min (ref >60)
Glucose, Bld: 117 mg/dL — ABNORMAL HIGH (ref 65–99)
POTASSIUM: 4.6 mmol/L (ref 3.5–5.1)
Sodium: 135 mmol/L (ref 135–145)
TOTAL PROTEIN: 6.5 g/dL (ref 6.5–8.1)

## 2016-03-14 LAB — CBC WITH DIFFERENTIAL/PLATELET
BASOS ABS: 0 10*3/uL (ref 0.0–0.1)
BASOS PCT: 0 %
EOS ABS: 0.1 10*3/uL (ref 0.0–0.7)
EOS PCT: 2 %
HCT: 44.1 % (ref 39.0–52.0)
Hemoglobin: 15.5 g/dL (ref 13.0–17.0)
LYMPHS PCT: 10 %
Lymphs Abs: 0.5 10*3/uL — ABNORMAL LOW (ref 0.7–4.0)
MCH: 32 pg (ref 26.0–34.0)
MCHC: 35.1 g/dL (ref 30.0–36.0)
MCV: 91.1 fL (ref 78.0–100.0)
Monocytes Absolute: 0.9 10*3/uL (ref 0.1–1.0)
Monocytes Relative: 18 %
Neutro Abs: 3.4 10*3/uL (ref 1.7–7.7)
Neutrophils Relative %: 70 %
PLATELETS: 162 10*3/uL (ref 150–400)
RBC: 4.84 MIL/uL (ref 4.22–5.81)
RDW: 14.2 % (ref 11.5–15.5)
WBC: 4.9 10*3/uL (ref 4.0–10.5)

## 2016-03-14 LAB — I-STAT CG4 LACTIC ACID, ED: LACTIC ACID, VENOUS: 1.64 mmol/L (ref 0.5–1.9)

## 2016-03-14 MED ORDER — ALBUTEROL SULFATE (2.5 MG/3ML) 0.083% IN NEBU
5.0000 mg | INHALATION_SOLUTION | RESPIRATORY_TRACT | Status: AC
  Administered 2016-03-14 (×3): 5 mg via RESPIRATORY_TRACT
  Filled 2016-03-14 (×2): qty 6

## 2016-03-14 MED ORDER — SODIUM CHLORIDE 0.9 % IV BOLUS (SEPSIS)
1000.0000 mL | Freq: Once | INTRAVENOUS | Status: AC
  Administered 2016-03-14: 1000 mL via INTRAVENOUS

## 2016-03-14 MED ORDER — IPRATROPIUM-ALBUTEROL 0.5-2.5 (3) MG/3ML IN SOLN: 3.0000 mL | Freq: Four times a day (QID) | RESPIRATORY_TRACT | Status: DC

## 2016-03-14 MED ORDER — POTASSIUM CHLORIDE CRYS ER 20 MEQ PO TBCR
20.0000 meq | EXTENDED_RELEASE_TABLET | Freq: Two times a day (BID) | ORAL | Status: DC
  Administered 2016-03-14 – 2016-03-15 (×2): 20 meq via ORAL
  Filled 2016-03-14 (×2): qty 1

## 2016-03-14 MED ORDER — LEVOFLOXACIN IN D5W 750 MG/150ML IV SOLN: 750.0000 mg | INTRAVENOUS | Status: DC

## 2016-03-14 MED ORDER — PNEUMOCOCCAL VAC POLYVALENT 25 MCG/0.5ML IJ INJ
0.5000 mL | INJECTION | INTRAMUSCULAR | Status: AC
  Administered 2016-03-15: 0.5 mL via INTRAMUSCULAR
  Filled 2016-03-14: qty 0.5

## 2016-03-14 MED ORDER — IPRATROPIUM-ALBUTEROL 0.5-2.5 (3) MG/3ML IN SOLN: 3.0000 mL | RESPIRATORY_TRACT | Status: DC | PRN

## 2016-03-14 MED ORDER — LISINOPRIL-HYDROCHLOROTHIAZIDE 20-25 MG PO TABS: 1.0000 | ORAL_TABLET | Freq: Two times a day (BID) | ORAL | Status: DC

## 2016-03-14 MED ORDER — ACETAMINOPHEN 325 MG PO TABS
650.0000 mg | ORAL_TABLET | Freq: Four times a day (QID) | ORAL | Status: DC | PRN
  Administered 2016-03-14: 650 mg via ORAL
  Filled 2016-03-14: qty 2

## 2016-03-14 MED ORDER — HEPARIN SODIUM (PORCINE) 5000 UNIT/ML IJ SOLN
5000.0000 [IU] | Freq: Three times a day (TID) | INTRAMUSCULAR | Status: DC
  Administered 2016-03-14 – 2016-03-15 (×2): 5000 [IU] via SUBCUTANEOUS
  Filled 2016-03-14 (×2): qty 1

## 2016-03-14 MED ORDER — SENNOSIDES-DOCUSATE SODIUM 8.6-50 MG PO TABS: 1.0000 | ORAL_TABLET | Freq: Every evening | ORAL | Status: DC | PRN

## 2016-03-14 MED ORDER — ONDANSETRON HCL 4 MG PO TABS: 4.0000 mg | ORAL_TABLET | Freq: Four times a day (QID) | ORAL | Status: DC | PRN

## 2016-03-14 MED ORDER — SODIUM CHLORIDE 0.9 % IV BOLUS (SEPSIS)
500.0000 mL | Freq: Once | INTRAVENOUS | Status: AC
  Administered 2016-03-14: 500 mL via INTRAVENOUS

## 2016-03-14 MED ORDER — DEXTROSE 5 % IV SOLN: 2.0000 g | Freq: Three times a day (TID) | INTRAVENOUS | Status: DC

## 2016-03-14 MED ORDER — HYDROCHLOROTHIAZIDE 25 MG PO TABS
25.0000 mg | ORAL_TABLET | Freq: Two times a day (BID) | ORAL | Status: DC
  Administered 2016-03-15: 25 mg via ORAL
  Filled 2016-03-14: qty 1

## 2016-03-14 MED ORDER — IPRATROPIUM-ALBUTEROL 0.5-2.5 (3) MG/3ML IN SOLN
3.0000 mL | RESPIRATORY_TRACT | Status: AC
  Administered 2016-03-14 – 2016-03-15 (×6): 3 mL via RESPIRATORY_TRACT
  Filled 2016-03-14 (×6): qty 3

## 2016-03-14 MED ORDER — INFLUENZA VAC SPLIT QUAD 0.5 ML IM SUSY
0.5000 mL | PREFILLED_SYRINGE | INTRAMUSCULAR | Status: DC
  Filled 2016-03-14: qty 0.5

## 2016-03-14 MED ORDER — VANCOMYCIN HCL IN DEXTROSE 1-5 GM/200ML-% IV SOLN
1000.0000 mg | Freq: Once | INTRAVENOUS | Status: AC
  Administered 2016-03-14: 1000 mg via INTRAVENOUS
  Filled 2016-03-14: qty 200

## 2016-03-14 MED ORDER — OSELTAMIVIR PHOSPHATE 75 MG PO CAPS
75.0000 mg | ORAL_CAPSULE | Freq: Two times a day (BID) | ORAL | Status: DC
  Administered 2016-03-15: 75 mg via ORAL
  Filled 2016-03-14 (×2): qty 1

## 2016-03-14 MED ORDER — PREDNISONE 20 MG PO TABS
60.0000 mg | ORAL_TABLET | Freq: Every day | ORAL | Status: DC
  Administered 2016-03-15: 60 mg via ORAL
  Filled 2016-03-14: qty 3

## 2016-03-14 MED ORDER — AZTREONAM IN DEXTROSE 2 GM/50ML IV SOLN
2.0000 g | Freq: Once | INTRAVENOUS | Status: AC
  Administered 2016-03-14: 2 g via INTRAVENOUS
  Filled 2016-03-14: qty 50

## 2016-03-14 MED ORDER — GUAIFENESIN ER 600 MG PO TB12
600.0000 mg | ORAL_TABLET | Freq: Two times a day (BID) | ORAL | Status: DC
  Administered 2016-03-14 – 2016-03-15 (×2): 600 mg via ORAL
  Filled 2016-03-14 (×2): qty 1

## 2016-03-14 MED ORDER — ONDANSETRON HCL 4 MG/2ML IJ SOLN: 4.0000 mg | Freq: Four times a day (QID) | INTRAMUSCULAR | Status: DC | PRN

## 2016-03-14 MED ORDER — LEVOFLOXACIN IN D5W 750 MG/150ML IV SOLN
750.0000 mg | Freq: Once | INTRAVENOUS | Status: AC
  Administered 2016-03-14: 750 mg via INTRAVENOUS
  Filled 2016-03-14: qty 150

## 2016-03-14 MED ORDER — VANCOMYCIN HCL 10 G IV SOLR
1250.0000 mg | Freq: Two times a day (BID) | INTRAVENOUS | Status: DC
  Filled 2016-03-14: qty 1250

## 2016-03-14 MED ORDER — ACETAMINOPHEN 650 MG RE SUPP: 650.0000 mg | Freq: Four times a day (QID) | RECTAL | Status: DC | PRN

## 2016-03-14 MED ORDER — LISINOPRIL 20 MG PO TABS
20.0000 mg | ORAL_TABLET | Freq: Two times a day (BID) | ORAL | Status: DC
  Administered 2016-03-15: 20 mg via ORAL
  Filled 2016-03-14: qty 1

## 2016-03-14 MED ORDER — OSELTAMIVIR PHOSPHATE 75 MG PO CAPS
75.0000 mg | ORAL_CAPSULE | Freq: Once | ORAL | Status: AC
  Administered 2016-03-14: 75 mg via ORAL
  Filled 2016-03-14: qty 1

## 2016-03-14 MED ORDER — METHYLPREDNISOLONE SODIUM SUCC 125 MG IJ SOLR
125.0000 mg | Freq: Once | INTRAMUSCULAR | Status: AC
  Administered 2016-03-14: 125 mg via INTRAVENOUS
  Filled 2016-03-14: qty 2

## 2016-03-14 MED ORDER — AZTREONAM 2 G IJ SOLR
2.0000 g | Freq: Once | INTRAMUSCULAR | Status: DC
  Filled 2016-03-14: qty 2

## 2016-03-14 MED ORDER — GABAPENTIN 100 MG PO CAPS
100.0000 mg | ORAL_CAPSULE | Freq: Every day | ORAL | Status: DC
  Administered 2016-03-14: 100 mg via ORAL
  Filled 2016-03-14: qty 1

## 2016-03-14 MED ORDER — MOMETASONE FURO-FORMOTEROL FUM 200-5 MCG/ACT IN AERO
2.0000 | INHALATION_SPRAY | Freq: Two times a day (BID) | RESPIRATORY_TRACT | Status: DC
  Administered 2016-03-14 – 2016-03-15 (×2): 2 via RESPIRATORY_TRACT
  Filled 2016-03-14: qty 8.8

## 2016-03-14 MED ORDER — METOPROLOL TARTRATE 25 MG PO TABS
25.0000 mg | ORAL_TABLET | Freq: Two times a day (BID) | ORAL | Status: DC
  Administered 2016-03-14 – 2016-03-15 (×2): 25 mg via ORAL
  Filled 2016-03-14 (×2): qty 1

## 2016-03-14 MED ORDER — IPRATROPIUM-ALBUTEROL 0.5-2.5 (3) MG/3ML IN SOLN
RESPIRATORY_TRACT | Status: AC
  Administered 2016-03-14: 3 mL
  Filled 2016-03-14: qty 3

## 2016-03-14 MED ORDER — ALBUTEROL SULFATE (2.5 MG/3ML) 0.083% IN NEBU
5.0000 mg | INHALATION_SOLUTION | Freq: Once | RESPIRATORY_TRACT | Status: AC
  Administered 2016-03-14: 5 mg via RESPIRATORY_TRACT
  Filled 2016-03-14: qty 6

## 2016-03-14 NOTE — ED Notes (Signed)
RN attempted to call report to floor again.  RN is getting another report at this time.

## 2016-03-14 NOTE — ED Notes (Signed)
RN attempted to call report to floor  

## 2016-03-14 NOTE — ED Notes (Signed)
Attempted to ambulate Pt to bathroom.  O2 sat dropped almost immediately to 78-82% on RA

## 2016-03-14 NOTE — ED Provider Notes (Signed)
[facility]-EMERGENCY DEPT Provider Note   CSN: [id number] Arrival date & time: 03/14/16  1416     History   Chief Complaint Chief Complaint  Patient presents with  . Flu Like symptoms    HPI Dylan Harrington is a 76 y.o. male.  HPI  The patient presents to the emergency room with complaints of cough, congestion, fever and flu symptoms.  Patient states symptoms started about a week ago. He began having cough and congestion. Over the last few days the symptoms have been getting more severe. He has felt more short of breath. He is feeling weak. He's had high fevers at home.  Patient felt sick enough today to call EMS to be brought into the emergency room for evaluation.  Past Medical History:  Diagnosis Date  . COPD (chronic obstructive pulmonary disease) (HCC)   . Glaucoma   . Hypertension   . Pacemaker   . Renal disorder   . Sleep apnea     Patient Active Problem List   Diagnosis Date Noted  . COPD exacerbation (HCC) 03/14/2016    Past Surgical History:  Procedure Laterality Date  . KIDNEY STONE SURGERY    . MEDIAL PARTIAL KNEE REPLACEMENT    . PACEMAKER INSERTION    . SHOULDER SURGERY    . SPINE SURGERY    . stents         Home Medications    Prior to Admission medications   Medication Sig Start Date End Date Taking? Authorizing Provider  gabapentin (NEURONTIN) 100 MG capsule Take 100-300 mg by mouth at bedtime. Titrate up to 300 mg qhs.   Yes Historical Provider, MD  guaiFENesin (MUCINEX) 600 MG 12 hr tablet Take 600 mg by mouth 2 (two) times daily.   Yes Historical Provider, MD  lisinopril-hydrochlorothiazide (PRINZIDE,ZESTORETIC) 20-25 MG per tablet Take 1 tablet by mouth 2 (two) times daily.    Yes Historical Provider, MD  metoprolol (LOPRESSOR) 50 MG tablet Take 25 mg by mouth 2 (two) times daily.    Yes Historical Provider, MD  potassium chloride SA (K-DUR,KLOR-CON) 20 MEQ tablet Take 20 mEq by mouth 2 (two) times daily.   Yes Historical Provider, MD    albuterol (PROVENTIL HFA;VENTOLIN HFA) 108 (90 BASE) MCG/ACT inhaler Inhale 2 puffs into the lungs 4 (four) times daily. Patient not taking: Reported on 05/24/2014 07/28/13   [name], MD  docusate sodium (COLACE) 100 MG capsule Take 1 capsule (100 mg total) by mouth every 12 (twelve) hours. Patient not taking: Reported on 03/14/2016 08/21/14   Marisa Severin, MD  ondansetron (ZOFRAN ODT) 8 MG disintegrating tablet Take 1 tablet (8 mg total) by mouth every 8 (eight) hours as needed for nausea or vomiting. Patient not taking: Reported on 03/14/2016 08/21/14   Marisa Severin, MD  oxyCODONE-acetaminophen (PERCOCET/ROXICET) 5-325 MG per tablet Take 2 tablets by mouth every 4 (four) hours as needed for severe pain. Patient not taking: Reported on 03/14/2016 08/21/14   Marisa Severin, MD  predniSONE (DELTASONE) 20 MG tablet Take 1 tablet (20 mg total) by mouth 2 (two) times daily. Patient not taking: Reported on 05/24/2014 07/28/13   [name], MD  tamsulosin (FLOMAX) 0.4 MG CAPS capsule Take 1 capsule (0.4 mg total) by mouth daily. Patient not taking: Reported on 03/14/2016 08/21/14   Marisa Severin, MD    Family History History reviewed. No pertinent family history.  Social History Social History  Substance Use Topics  . Smoking status: Heavy Tobacco Smoker  Packs/day: 0.00    Types: Cigarettes  . Smokeless tobacco: Not on file  . Alcohol use Yes     Allergies   Keflex [cephalexin]   Review of Systems Review of Systems  All other systems reviewed and are negative.    Physical Exam Updated Vital Signs BP 113/77   Pulse 78   Temp 101.9 F (38.8 C) (Oral)   Resp 23   Ht 6' (1.829 m)   Wt 90.7 kg   SpO2 95%   BMI 27.12 kg/m   Physical Exam  Constitutional: No distress.  Ill-appearing  HENT:  Head: Normocephalic and atraumatic.  Right Ear: External ear normal.  Left Ear: External ear normal.  Mucous membranes are dry  Eyes: Conjunctivae are normal. Right eye exhibits no discharge. Left  eye exhibits no discharge. No scleral icterus.  Neck: Neck supple. No tracheal deviation present.  Cardiovascular: Normal rate, regular rhythm and intact distal pulses.   Pulmonary/Chest: Effort normal. No stridor. No respiratory distress. He has wheezes. He has no rales.  Abdominal: Soft. Bowel sounds are normal. He exhibits no distension. There is no tenderness. There is no rebound and no guarding.  Musculoskeletal: He exhibits no edema or tenderness.  Neurological: He is alert. He has normal strength. No cranial nerve deficit (no facial droop, extraocular movements intact, no slurred speech) or sensory deficit. He exhibits normal muscle tone. He displays no seizure activity. Coordination normal.  Skin: Skin is warm and dry. No rash noted.  Psychiatric: He has a normal mood and affect.  Nursing note and vitals reviewed.    ED Treatments / Results  Labs (all labs ordered are listed, but only abnormal results are displayed) Labs Reviewed  CBC WITH DIFFERENTIAL/PLATELET - Abnormal; Notable for the following:       Result Value   Lymphs Abs 0.5 (*)    All other components within normal limits  URINALYSIS, ROUTINE W REFLEX MICROSCOPIC - Abnormal; Notable for the following:    APPearance HAZY (*)    Hgb urine dipstick SMALL (*)    Squamous Epithelial / LPF 0-5 (*)    All other components within normal limits  COMPREHENSIVE METABOLIC PANEL - Abnormal; Notable for the following:    Glucose, Bld 117 (*)    Calcium 8.0 (*)    AST 50 (*)    Total Bilirubin 1.3 (*)    All other components within normal limits  BLOOD GAS, ARTERIAL - Abnormal; Notable for the following:    pCO2 arterial 31.3 (*)    pO2, Arterial 69.6 (*)    All other components within normal limits  CULTURE, BLOOD (ROUTINE X 2)  CULTURE, BLOOD (ROUTINE X 2)  URINE CULTURE  INFLUENZA PANEL BY PCR (TYPE A & B)  I-STAT CG4 LACTIC ACID, ED  I-STAT CG4 LACTIC ACID, ED    EKG  EKG Interpretation  Date/Time:  Monday  March 14 2016 14:49:27 EST Ventricular Rate:  75 PR Interval:    QRS Duration: 98 QT Interval:  446 QTC Calculation: 499 R Axis:   17 Text Interpretation:  Sinus rhythm Low voltage, extremity leads Borderline prolonged QT interval No significant change since last tracing Confirmed by Jaeli Grubb  MD-J, Genola Yuille ([id number]) on 03/14/2016 3:09:59 PM       Radiology Dg Chest Portable 1 View  Result Date: 03/14/2016 CLINICAL DATA:  Fever, myalgias, cough for 2 weeks EXAM: PORTABLE CHEST 1 VIEW COMPARISON:  05/24/2014 FINDINGS: Low lung volumes with minor basilar atelectasis. Cardiomegaly with central vascular congestion.  No current CHF or pneumonia. No effusion or pneumothorax. Left subclavian 2 lead pacer evident. Thoracic aorta is atherosclerotic and ectatic. This accounts for slight right ward tracheal deviation. Degenerative changes of the spine and right shoulder. IMPRESSION: Low volume exam with cardiomegaly and vascular congestion. Basilar atelectasis. Electronically Signed   By: Judie PetitM.  Shick M.D.   On: 03/14/2016 16:33    Procedures Procedures (including critical care time)  Medications Ordered in ED Medications  albuterol (PROVENTIL) (2.5 MG/3ML) 0.083% nebulizer solution 5 mg (5 mg Nebulization Given 03/14/16 1642)  albuterol (PROVENTIL) (2.5 MG/3ML) 0.083% nebulizer solution 5 mg (not administered)  methylPREDNISolone sodium succinate (SOLU-MEDROL) 125 mg/2 mL injection 125 mg (not administered)  ipratropium-albuterol (DUONEB) 0.5-2.5 (3) MG/3ML nebulizer solution (3 mLs  Given 03/14/16 1527)  sodium chloride 0.9 % bolus 1,000 mL (0 mLs Intravenous Stopped 03/14/16 1646)    And  sodium chloride 0.9 % bolus 1,000 mL (0 mLs Intravenous Stopped 03/14/16 1646)    And  sodium chloride 0.9 % bolus 1,000 mL (1,000 mLs Intravenous New Bag/Given 03/14/16 1647)    And  sodium chloride 0.9 % bolus 500 mL (0 mLs Intravenous Stopped 03/14/16 1745)  levofloxacin (LEVAQUIN) IVPB 750 mg (0 mg Intravenous Stopped  03/14/16 1646)  vancomycin (VANCOCIN) IVPB 1000 mg/200 mL premix (1,000 mg Intravenous New Bag/Given 03/14/16 1624)  aztreonam (AZACTAM) 2 GM IVPB (0 g Intravenous Stopped 03/14/16 1615)     Initial Impression / Assessment and Plan / ED Course  I have reviewed the triage vital signs and the nursing notes.  Pertinent labs & imaging results that were available during my care of the patient were reviewed by me and considered in my medical decision making (see chart for details).  Clinical Course as of Mar 15 1747  Mon Mar 14, 2016  1727 Patient attempted to walk around the emergency room. His oxygenation at rest was in the mid 90s. His saturation dropped in the 70s when he attempted to walk around.  [JK]    Clinical Course User Index [JK] Linwood DibblesJon Satia Winger, MD   Patient presented to the emergency room with fever, body aches and cough. Patient's symptoms are concerning for influenza. Patient does have a history of COPD and was having issues with wheezing and tachypnea here in the emergency room. His ABG showed a PaO2.  No elevation in lactic acid level and normal BP. Doubt sepsis.  At rest his oxygen saturation was in the mid 90s but when the patient walked around dropped.  I will consult with the medical service for admission for COPD exacerbation and probable influenza-like illness.  Final Clinical Impressions(s) / ED Diagnoses   Final diagnoses:  COPD exacerbation (HCC)  Influenza-like illness    New Prescriptions New Prescriptions   No medications on file     Linwood DibblesJon Verl Whitmore, MD 03/14/16 1749

## 2016-03-14 NOTE — ED Triage Notes (Signed)
Pt is from home.  Pt reports that he has had fever, body aches and cough for last 2 weeks.  BP 110/93 94P 15RR  CBG98.  Pt ahs reported that he does use O2 at home and then he told EMS that he does NOT use O2 at home.  99% on RA

## 2016-03-14 NOTE — H&P (Addendum)
History and Physical    Dylan Harrington:[id number] DOB: Dec 26, 1940  DOA: 03/14/2016 PCP: Winifred Olive, MD  Patient coming from: Home  Chief Complaint: Home  HPI: Dylan Harrington is a 76 y.o. male with medical history significant of HTN well controlled, pacemaker, COPD not on medications and Sleep apnea not on CPAP presented to the ED c/o SOB, cough, nasal congestion and fever. Patient report that symptoms started 3 day PTA. Patient has progressively worsen and decided to call EMS due to weakness. Did not took any medications. Patient report that he was expose to some family members that were sick with the flu about a week ago. Patient is a current smoker hx of 40 ppy, now smoking about 1 pack per week. Not oxygen dependent. Denies chest pain, dizziness and palpitation.   ED Course: Found to be mid hypoxemic on abg, treated with nebulizer and solumedrol. Pulse ox on ambulation was check and patient desat to the low 70's. [facility] ask to admit for COPD exacerbation   Review of Systems:   General: no changes in body weight, no fever chills or decrease in energy.  HEENT: no blurry vision, hearing changes or sore throat Respiratory: See HPI  CV: no chest pain, no palpitations GI: no nausea, vomiting, abdominal pain, diarrhea, constipation GU: no dysuria, burning on urination, increased urinary frequency, hematuria  Ext:. No deformities,  Neuro: no unilateral weakness, numbness, or tingling, no vision change or hearing loss Skin: No rashes, lesions or wounds. MSK: No muscle spasm, no deformity, no limitation of range of movement in spin Heme: No easy bruising.  Travel history: No recent long distant travel.   Past Medical History:  Diagnosis Date  . COPD (chronic obstructive pulmonary disease) (HCC)   . Glaucoma   . Hypertension   . Pacemaker   . Renal disorder   . Sleep apnea     Past Surgical History:  Procedure Laterality Date  . KIDNEY STONE SURGERY    . MEDIAL PARTIAL  KNEE REPLACEMENT    . PACEMAKER INSERTION    . SHOULDER SURGERY    . SPINE SURGERY    . stents       reports that he has been smoking Cigarettes.  He has been smoking about 0.00 packs per day. He does not have any smokeless tobacco history on file. He reports that he drinks alcohol. His drug history is not on file.  Allergies  Allergen Reactions  . Keflex [Cephalexin] Hives, Rash and Other (See Comments)    History reviewed. No pertinent family history.  Prior to Admission medications   Medication Sig Start Date End Date Taking? Authorizing Provider  gabapentin (NEURONTIN) 100 MG capsule Take 100-300 mg by mouth at bedtime. Titrate up to 300 mg qhs.   Yes Historical Provider, MD  guaiFENesin (MUCINEX) 600 MG 12 hr tablet Take 600 mg by mouth 2 (two) times daily.   Yes Historical Provider, MD  lisinopril-hydrochlorothiazide (PRINZIDE,ZESTORETIC) 20-25 MG per tablet Take 1 tablet by mouth 2 (two) times daily.    Yes Historical Provider, MD  metoprolol (LOPRESSOR) 50 MG tablet Take 25 mg by mouth 2 (two) times daily.    Yes Historical Provider, MD  potassium chloride SA (K-DUR,KLOR-CON) 20 MEQ tablet Take 20 mEq by mouth 2 (two) times daily.   Yes Historical Provider, MD  albuterol (PROVENTIL HFA;VENTOLIN HFA) 108 (90 BASE) MCG/ACT inhaler Inhale 2 puffs into the lungs 4 (four) times daily. Patient not taking: Reported on 05/24/2014 07/28/13   Dineen Kid  Lorenz CoasterKeller, MD  docusate sodium (COLACE) 100 MG capsule Take 1 capsule (100 mg total) by mouth every 12 (twelve) hours. Patient not taking: Reported on 03/14/2016 08/21/14   Marisa Severinlga Otter, MD  ondansetron (ZOFRAN ODT) 8 MG disintegrating tablet Take 1 tablet (8 mg total) by mouth every 8 (eight) hours as needed for nausea or vomiting. Patient not taking: Reported on 03/14/2016 08/21/14   Marisa Severinlga Otter, MD  oxyCODONE-acetaminophen (PERCOCET/ROXICET) 5-325 MG per tablet Take 2 tablets by mouth every 4 (four) hours as needed for severe pain. Patient not taking:  Reported on 03/14/2016 08/21/14   Marisa Severinlga Otter, MD  predniSONE (DELTASONE) 20 MG tablet Take 1 tablet (20 mg total) by mouth 2 (two) times daily. Patient not taking: Reported on 05/24/2014 07/28/13   Reuben Likesavid C Keller, MD  tamsulosin (FLOMAX) 0.4 MG CAPS capsule Take 1 capsule (0.4 mg total) by mouth daily. Patient not taking: Reported on 03/14/2016 08/21/14   Marisa Severinlga Otter, MD    Physical Exam: Vitals:   03/14/16 1600 03/14/16 1630 03/14/16 1738 03/14/16 1806  BP: 147/87 126/82 113/77   Pulse: 75 78 78 86  Resp: 25 (!) 33 23 25  Temp:      TempSrc:      SpO2: 95% 95% 95% 93%  Weight:      Height:         Constitutional: Mild distress due to breathing - difficulty to speak in full sentences  Eyes: PERRL, lids and conjunctivae normal ENMT: Mucous membranes are moist. Posterior pharynx clear of any exudate or lesions.Normal dentition.  Neck: normal, supple, no masses, no thyromegaly Respiratory: Decrease BS through, expiratory wheezing 2:1 more prominent at the upper lobe b/l  Cardiovascular: Regular rate and rhythm, no murmurs / rubs / gallops. No extremity edema. 2+ pedal pulses. Abdomen: no tenderness, no masses palpated. No hepatosplenomegaly. Bowel sounds positive.  Musculoskeletal: no clubbing / cyanosis. No joint deformity upper and lower extremities. Good ROM, no contractures. Normal muscle tone.  Skin: no rashes, lesions, ulcers. No induration Neurologic: CN 2-12 grossly intact. Strength 5/5 in all 4.  Psychiatric: Normal judgment and insight. Alert and oriented x 3. Normal mood.    Labs on Admission: I have personally reviewed following labs and imaging studies  CBC:  Recent Labs Lab 03/14/16 1459  WBC 4.9  NEUTROABS 3.4  HGB 15.5  HCT 44.1  MCV 91.1  PLT 162   Basic Metabolic Panel:  Recent Labs Lab 03/14/16 1559  NA 135  K 4.6  CL 103  CO2 24  GLUCOSE 117*  BUN 18  CREATININE 1.06  CALCIUM 8.0*   GFR: Estimated Creatinine Clearance: 67.1 mL/min (by C-G formula  based on SCr of 1.06 mg/dL). Liver Function Tests:  Recent Labs Lab 03/14/16 1559  AST 50*  ALT 26  ALKPHOS 45  BILITOT 1.3*  PROT 6.5  ALBUMIN 3.5   No results for input(s): LIPASE, AMYLASE in the last 168 hours. No results for input(s): AMMONIA in the last 168 hours. Coagulation Profile: No results for input(s): INR, PROTIME in the last 168 hours. Cardiac Enzymes: No results for input(s): CKTOTAL, CKMB, CKMBINDEX, TROPONINI in the last 168 hours. BNP (last 3 results) No results for input(s): PROBNP in the last 8760 hours. HbA1C: No results for input(s): HGBA1C in the last 72 hours. CBG: No results for input(s): GLUCAP in the last 168 hours. Lipid Profile: No results for input(s): CHOL, HDL, LDLCALC, TRIG, CHOLHDL, LDLDIRECT in the last 72 hours. Thyroid Function Tests: No results for  input(s): TSH, T4TOTAL, FREET4, T3FREE, THYROIDAB in the last 72 hours. Anemia Panel: No results for input(s): VITAMINB12, FOLATE, FERRITIN, TIBC, IRON, RETICCTPCT in the last 72 hours. Urine analysis:    Component Value Date/Time   COLORURINE YELLOW 03/14/2016 1459   APPEARANCEUR HAZY (A) 03/14/2016 1459   LABSPEC 1.019 03/14/2016 1459   PHURINE 6.0 03/14/2016 1459   GLUCOSEU NEGATIVE 03/14/2016 1459   HGBUR SMALL (A) 03/14/2016 1459   BILIRUBINUR NEGATIVE 03/14/2016 1459   KETONESUR NEGATIVE 03/14/2016 1459   PROTEINUR NEGATIVE 03/14/2016 1459   UROBILINOGEN 2.0 (H) 08/20/2014 2345   NITRITE NEGATIVE 03/14/2016 1459   LEUKOCYTESUR NEGATIVE 03/14/2016 1459   Sepsis Labs: !!!!!!!!!!!!!!!!!!!!!!!!!!!!!!!!!!!!!!!!!!!! @LABRCNTIP (procalcitonin:4,lacticidven:4) )No results found for this or any previous visit (from the past 240 hour(s)).   Radiological Exams on Admission: Dg Chest Portable 1 View  Result Date: 03/14/2016 CLINICAL DATA:  Fever, myalgias, cough for 2 weeks EXAM: PORTABLE CHEST 1 VIEW COMPARISON:  05/24/2014 FINDINGS: Low lung volumes with minor basilar atelectasis.  Cardiomegaly with central vascular congestion. No current CHF or pneumonia. No effusion or pneumothorax. Left subclavian 2 lead pacer evident. Thoracic aorta is atherosclerotic and ectatic. This accounts for slight right ward tracheal deviation. Degenerative changes of the spine and right shoulder. IMPRESSION: Low volume exam with cardiomegaly and vascular congestion. Basilar atelectasis. Electronically Signed   By: Judie Petit.  Shick M.D.   On: 03/14/2016 16:33    EKG: Independently reviewed. NSR low R wave progression, T wave inversion in the lateral leads  Assessment/Plan Acute ron chronic respiratory failure with hypoxia/COPD exacerbation - possible due to influenza Doubt sepsis abx given in ED - no abx indicated at this time - no white count, UA clean, CXR negative  Admit to obs  Prednisone 60 mg daily  Scheduled duoneb q4hrs  Duoneb PRN q 2  Started on Dulera  Started on Tamiflu if PCR negative - d/c  O2 supplementation keep O2 sat > 89% Get pulse ox with ambulation  Patient may need PFT and pulm referral  Tobacco cessation discussed  HTN - stable   Continue home medications - Lisinopril, HZTC and Toprol   Patient with Hx of Pacemaker - EKG not pacing  PM may need to be interrogated    OSA  CPAP PRN  Need sleep studies as outpatient   Tobacco abuse  Counseled on cessation   DVT prophylaxis: Heparin sq Code Status: FULL Family Communication: None at bedside Disposition Plan: Anticipate discharge to previous home environment.  Consults called: None Admission status: Med-surg/Obs   Latrelle Dodrill MD [facility] Pager [phone]  If 7PM-7AM, please contact night-coverage [email or web address] Password [facility]  03/14/2016, 6:32 PM

## 2016-03-14 NOTE — ED Notes (Signed)
RN contacted pharmacy to verify orders for Azactam (will not scan properly)

## 2016-03-14 NOTE — Progress Notes (Signed)
Pharmacy Antibiotic Note  Dylan Harrington is a 76 y.o. male admitted on 03/14/2016 with sepsis.  Pharmacy has been consulted for Vancomycin, Aztreonam, Levaquin dosing.  Patient reports allergy to Keflex with hives, rash.    Initial 1x doses ordered in ED: Aztreonam 2g, Levaquin 750 mg, Vancomycin 1g  Plan: Vancomycin 1250 mg IV q12h. Levaquin 750 mg IV q24h. Aztreonam 2g IV q8h. F/u cultures results, influenza panel, clinical course.     Temp (24hrs), Avg:101.9 F (38.8 C), Min:101.9 F (38.8 C), Max:101.9 F (38.8 C)  No results for input(s): WBC, CREATININE, LATICACIDVEN, VANCOTROUGH, VANCOPEAK, VANCORANDOM, GENTTROUGH, GENTPEAK, GENTRANDOM, TOBRATROUGH, TOBRAPEAK, TOBRARND, AMIKACINPEAK, AMIKACINTROU, AMIKACIN in the last 168 hours.  CrCl cannot be calculated (Patient's most recent lab result is older than the maximum 21 days allowed.).    Allergies  Allergen Reactions  . Keflex [Cephalexin] Hives, Rash and Other (See Comments)    Antimicrobials this admission: 1/29 Vancomycin >>  1/29 Aztreonam >>  1/29 Levaquin >>  Dose adjustments this admission: -  Microbiology results: 1/29 BCx: sent 1/29 UCx: sent   Thank you for allowing pharmacy to be a part of this patient's care.  Clance BollRunyon, Aisling Emigh 03/14/2016 3:02 PM

## 2016-03-15 DIAGNOSIS — I10 Essential (primary) hypertension: Secondary | ICD-10-CM | POA: Diagnosis not present

## 2016-03-15 DIAGNOSIS — Z720 Tobacco use: Secondary | ICD-10-CM

## 2016-03-15 DIAGNOSIS — G4733 Obstructive sleep apnea (adult) (pediatric): Secondary | ICD-10-CM | POA: Diagnosis not present

## 2016-03-15 DIAGNOSIS — J441 Chronic obstructive pulmonary disease with (acute) exacerbation: Secondary | ICD-10-CM | POA: Diagnosis not present

## 2016-03-15 DIAGNOSIS — J111 Influenza due to unidentified influenza virus with other respiratory manifestations: Secondary | ICD-10-CM | POA: Diagnosis present

## 2016-03-15 DIAGNOSIS — J9601 Acute respiratory failure with hypoxia: Secondary | ICD-10-CM | POA: Diagnosis not present

## 2016-03-15 HISTORY — DX: Obstructive sleep apnea (adult) (pediatric): G47.33

## 2016-03-15 LAB — RESPIRATORY PANEL BY PCR
Adenovirus: NOT DETECTED
BORDETELLA PERTUSSIS-RVPCR: NOT DETECTED
CHLAMYDOPHILA PNEUMONIAE-RVPPCR: NOT DETECTED
CORONAVIRUS HKU1-RVPPCR: NOT DETECTED
Coronavirus 229E: NOT DETECTED
Coronavirus NL63: NOT DETECTED
Coronavirus OC43: NOT DETECTED
INFLUENZA A H3-RVPPCR: DETECTED — AB
INFLUENZA B-RVPPCR: NOT DETECTED
METAPNEUMOVIRUS-RVPPCR: NOT DETECTED
Mycoplasma pneumoniae: NOT DETECTED
PARAINFLUENZA VIRUS 2-RVPPCR: NOT DETECTED
PARAINFLUENZA VIRUS 3-RVPPCR: NOT DETECTED
PARAINFLUENZA VIRUS 4-RVPPCR: NOT DETECTED
Parainfluenza Virus 1: NOT DETECTED
RHINOVIRUS / ENTEROVIRUS - RVPPCR: NOT DETECTED
Respiratory Syncytial Virus: NOT DETECTED

## 2016-03-15 LAB — CBC
HEMATOCRIT: 39.6 % (ref 39.0–52.0)
Hemoglobin: 13.4 g/dL (ref 13.0–17.0)
MCH: 31.8 pg (ref 26.0–34.0)
MCHC: 33.8 g/dL (ref 30.0–36.0)
MCV: 93.8 fL (ref 78.0–100.0)
PLATELETS: 123 10*3/uL — AB (ref 150–400)
RBC: 4.22 MIL/uL (ref 4.22–5.81)
RDW: 14.3 % (ref 11.5–15.5)
WBC: 2.3 10*3/uL — ABNORMAL LOW (ref 4.0–10.5)

## 2016-03-15 LAB — BASIC METABOLIC PANEL
Anion gap: 7 (ref 5–15)
BUN: 18 mg/dL (ref 6–20)
CO2: 23 mmol/L (ref 22–32)
CREATININE: 1.06 mg/dL (ref 0.61–1.24)
Calcium: 8.1 mg/dL — ABNORMAL LOW (ref 8.9–10.3)
Chloride: 107 mmol/L (ref 101–111)
GFR calc non Af Amer: >60 mL/min (ref >60)
Glucose, Bld: 184 mg/dL — ABNORMAL HIGH (ref 65–99)
POTASSIUM: 4 mmol/L (ref 3.5–5.1)
Sodium: 137 mmol/L (ref 135–145)

## 2016-03-15 LAB — URINE CULTURE: Culture: NO GROWTH

## 2016-03-15 MED ORDER — OSELTAMIVIR PHOSPHATE 75 MG PO CAPS: 75.0000 mg | ORAL_CAPSULE | Freq: Two times a day (BID) | ORAL | 0 refills | Status: AC

## 2016-03-15 MED ORDER — PREDNISONE 20 MG PO TABS: 40.0000 mg | ORAL_TABLET | Freq: Every day | ORAL | 0 refills | Status: AC

## 2016-03-15 MED ORDER — ORAL CARE MOUTH RINSE: 15.0000 mL | Freq: Two times a day (BID) | OROMUCOSAL | Status: DC

## 2016-03-15 NOTE — Discharge Summary (Signed)
Physician Discharge Summary  Dylan Harrington UJW:[id number] DOB: [birth year] DOA: 03/14/2016  PCP: Winifred Olive, MD  Admit date: 03/14/2016 Discharge date: 03/15/2016  Admitted From: Home Disposition:  Home  Recommendations for Outpatient Follow-up:  1. Follow up with PCP in 1 week  Discharge Condition: Stable CODE STATUS: Full code   Brief/Interim Summary:  HPI written by Latrelle Dodrill, MD on 03/14/2016  Chief Complaint: Home  HPI: Dylan Harrington is a 76 y.o. male with medical history significant of HTN well controlled, pacemaker, COPD not on medications and Sleep apnea not on CPAP presented to the ED c/o SOB, cough, nasal congestion and fever. Patient report that symptoms started 3 day PTA. Patient has progressively worsen and decided to call EMS due to weakness. Did not took any medications. Patient report that he was expose to some family members that were sick with the flu about a week ago. Patient is a current smoker hx of 40 ppy, now smoking about 1 pack per week. Not oxygen dependent. Denies chest pain, dizziness and palpitation.   ED Course: Found to be mid hypoxemic on abg, treated with nebulizer and solumedrol. Pulse ox on ambulation was check and patient desat to the low 70's. [facility] ask to admit for COPD exacerbation    Hospital course:  Influenza A infection Likely contributing to mild COPD exacerbation. Tamiflu started. Afebrile.  Acute respiratory failure with hypoxia Patient requiring oxygen on admission. Weaned off and patient ambulated without desaturations on room air. Likely secondary to mild COPD exacerbation in setting of influenza infection.  Essential hypertension Stable. Continued lisinopril, hydrochlorothiazide and metroprolol  OSA Stable. CPAP prn.  Tobacco abuse Stable. Counseled.  Discharge Diagnoses:  Principal Problem:   Influenza with respiratory manifestation Active Problems:   COPD exacerbation (HCC)   Acute respiratory failure with  hypoxia (HCC)   Obstructive sleep apnea   Tobacco abuse   Essential hypertension    Discharge Instructions   Allergies as of 03/15/2016      Reactions   Keflex [cephalexin] Hives, Rash, Other (See Comments)      Medication List    STOP taking these medications   ondansetron 8 MG disintegrating tablet Commonly known as:  ZOFRAN ODT   oxyCODONE-acetaminophen 5-325 MG tablet Commonly known as:  PERCOCET/ROXICET     TAKE these medications   albuterol 108 (90 Base) MCG/ACT inhaler Commonly known as:  PROVENTIL HFA;VENTOLIN HFA Inhale 2 puffs into the lungs 4 (four) times daily.   docusate sodium 100 MG capsule Commonly known as:  COLACE Take 1 capsule (100 mg total) by mouth every 12 (twelve) hours.   gabapentin 100 MG capsule Commonly known as:  NEURONTIN Take 100-300 mg by mouth at bedtime. Titrate up to 300 mg qhs.   lisinopril-hydrochlorothiazide 20-25 MG tablet Commonly known as:  PRINZIDE,ZESTORETIC Take 1 tablet by mouth 2 (two) times daily.   metoprolol 50 MG tablet Commonly known as:  LOPRESSOR Take 25 mg by mouth 2 (two) times daily.   MUCINEX 600 MG 12 hr tablet Generic drug:  guaiFENesin Take 600 mg by mouth 2 (two) times daily.   oseltamivir 75 MG capsule Commonly known as:  TAMIFLU Take 1 capsule (75 mg total) by mouth 2 (two) times daily. Take first dose this evening (1/30), and then take one capsule twice daily.   potassium chloride SA 20 MEQ tablet Commonly known as:  K-DUR,KLOR-CON Take 20 mEq by mouth 2 (two) times daily.   predniSONE 20 MG tablet Commonly known as:  DELTASONE Take  2 tablets (40 mg total) by mouth daily with breakfast. Start taking on:  03/16/2016 What changed:  how much to take  when to take this   tamsulosin 0.4 MG Caps capsule Commonly known as:  FLOMAX Take 1 capsule (0.4 mg total) by mouth daily.      Follow-up Information    [facility], MD. Schedule an appointment as soon as possible for a visit in 1  week(s).   Specialty:  Internal Medicine Contact information: [facility] [place] [address] [phone]          Allergies  Allergen Reactions  . Keflex [Cephalexin] Hives, Rash and Other (See Comments)    Consultations:  None   Procedures/Studies: Dg Chest Portable 1 View  Result Date: 03/14/2016 CLINICAL DATA:  Fever, myalgias, cough for 2 weeks EXAM: PORTABLE CHEST 1 VIEW COMPARISON:  05/24/2014 FINDINGS: Low lung volumes with minor basilar atelectasis. Cardiomegaly with central vascular congestion. No current CHF or pneumonia. No effusion or pneumothorax. Left subclavian 2 lead pacer evident. Thoracic aorta is atherosclerotic and ectatic. This accounts for slight right ward tracheal deviation. Degenerative changes of the spine and right shoulder. IMPRESSION: Low volume exam with cardiomegaly and vascular congestion. Basilar atelectasis. Electronically Signed   By: Judie Petit.  Shick M.D.   On: 03/14/2016 16:33     Subjective: Patient reports no dyspnea or chest pain.  Discharge Exam: Vitals:   03/15/16 1031 03/15/16 1352  BP: 121/70 (!) 124/92  Pulse:  93  Resp:  20  Temp:  98.2 F (36.8 C)   Vitals:   03/15/16 0805 03/15/16 1031 03/15/16 1204 03/15/16 1352  BP:  121/70  (!) 124/92  Pulse:    93  Resp:    20  Temp:    98.2 F (36.8 C)  TempSrc:    Oral  SpO2: 94%  92% 100%  Weight:      Height:        General: Pt is alert, awake, not in acute distress Cardiovascular: RRR, S1/S2 +, no rubs, no gallops Respiratory: CTA bilaterally, no wheezing, no rhonchi Abdominal: Soft, NT, ND, bowel sounds + Extremities: no edema, no cyanosis    The results of significant diagnostics from this hospitalization (including imaging, microbiology, ancillary and laboratory) are listed below for reference.     Microbiology: Recent Results (from the past 240 hour(s))  Urine culture     Status: None   Collection Time: 03/14/16  2:59 PM  Result Value Ref Range  Status   Specimen Description URINE, CLEAN CATCH  Final   Special Requests NONE  Final   Culture   Final    NO GROWTH Performed at [facility], [address] [place], [place] [address]    Report Status 03/15/2016 FINAL  Final  Blood Culture (routine x 2)     Status: None (Preliminary result)   Collection Time: 03/14/16  3:04 PM  Result Value Ref Range Status   Specimen Description BLOOD LEFT ANTECUBITAL  Final   Special Requests BOTTLES DRAWN AEROBIC AND ANAEROBIC 5 CC  Final   Culture   Final    NO GROWTH < 24 HOURS Performed at [facility], [address] [place], [place] [address]    Report Status PENDING  Incomplete  Blood Culture (routine x 2)     Status: None (Preliminary result)   Collection Time: 03/14/16  3:59 PM  Result Value Ref Range Status   Specimen Description BLOOD RIGHT HAND  Final  Special Requests BOTTLES DRAWN AEROBIC AND ANAEROBIC 5CC  Final   Culture   Final    NO GROWTH < 24 HOURS Performed at [facility], [address] [place], [place] [address]    Report Status PENDING  Incomplete  Respiratory Panel by PCR     Status: Abnormal   Collection Time: 03/14/16  8:26 PM  Result Value Ref Range Status   Adenovirus NOT DETECTED NOT DETECTED Final   Coronavirus 229E NOT DETECTED NOT DETECTED Final   Coronavirus HKU1 NOT DETECTED NOT DETECTED Final   Coronavirus NL63 NOT DETECTED NOT DETECTED Final   Coronavirus OC43 NOT DETECTED NOT DETECTED Final   Metapneumovirus NOT DETECTED NOT DETECTED Final   Rhinovirus / Enterovirus NOT DETECTED NOT DETECTED Final   Influenza A H3 DETECTED (A) NOT DETECTED Final   Influenza B NOT DETECTED NOT DETECTED Final   Parainfluenza Virus 1 NOT DETECTED NOT DETECTED Final   Parainfluenza Virus 2 NOT DETECTED NOT DETECTED Final   Parainfluenza Virus 3 NOT DETECTED NOT DETECTED Final   Parainfluenza Virus 4 NOT DETECTED NOT DETECTED Final   Respiratory Syncytial Virus NOT DETECTED NOT DETECTED Final    Bordetella pertussis NOT DETECTED NOT DETECTED Final   Chlamydophila pneumoniae NOT DETECTED NOT DETECTED Final   Mycoplasma pneumoniae NOT DETECTED NOT DETECTED Final    Comment: Performed at [facility], [address] [place], [place] [address]     Labs: Basic Metabolic Panel:  Recent Labs Lab 03/14/16 1559 03/15/16 0407  NA 135 137  K 4.6 4.0  CL 103 107  CO2 24 23  GLUCOSE 117* 184*  BUN 18 18  CREATININE 1.06 1.06  CALCIUM 8.0* 8.1*   Liver Function Tests:  Recent Labs Lab 03/14/16 1559  AST 50*  ALT 26  ALKPHOS 45  BILITOT 1.3*  PROT 6.5  ALBUMIN 3.5   CBC:  Recent Labs Lab 03/14/16 1459 03/15/16 0407  WBC 4.9 2.3*  NEUTROABS 3.4  --   HGB 15.5 13.4  HCT 44.1 39.6  MCV 91.1 93.8  PLT 162 123*   Cardiac Enzymes: No results for input(s): CKTOTAL, CKMB, CKMBINDEX, TROPONINI in the last 168 hours. BNP: Invalid input(s): POCBNP CBG: No results for input(s): GLUCAP in the last 168 hours. D-Dimer No results for input(s): DDIMER in the last 72 hours. Hgb A1c No results for input(s): HGBA1C in the last 72 hours. Lipid Profile No results for input(s): CHOL, HDL, LDLCALC, TRIG, CHOLHDL, LDLDIRECT in the last 72 hours. Thyroid function studies No results for input(s): TSH, T4TOTAL, T3FREE, THYROIDAB in the last 72 hours.  Invalid input(s): FREET3 Anemia work up No results for input(s): VITAMINB12, FOLATE, FERRITIN, TIBC, IRON, RETICCTPCT in the last 72 hours. Urinalysis    Component Value Date/Time   COLORURINE YELLOW 03/14/2016 1459   APPEARANCEUR HAZY (A) 03/14/2016 1459   LABSPEC 1.019 03/14/2016 1459   PHURINE 6.0 03/14/2016 1459   GLUCOSEU NEGATIVE 03/14/2016 1459   HGBUR SMALL (A) 03/14/2016 1459   BILIRUBINUR NEGATIVE 03/14/2016 1459   KETONESUR NEGATIVE 03/14/2016 1459   PROTEINUR NEGATIVE 03/14/2016 1459   UROBILINOGEN 2.0 (H) 08/20/2014 2345   NITRITE NEGATIVE 03/14/2016 1459   LEUKOCYTESUR NEGATIVE 03/14/2016 1459   Sepsis  Labs Invalid input(s): PROCALCITONIN,  WBC,  LACTICIDVEN Microbiology Recent Results (from the past 240 hour(s))  Urine culture     Status: None   Collection Time: 03/14/16  2:59 PM  Result Value Ref Range Status   Specimen Description URINE,  CLEAN CATCH  Final   Special Requests NONE  Final   Culture   Final    NO GROWTH Performed at [facility], [address] [place], [place] [address]    Report Status 03/15/2016 FINAL  Final  Blood Culture (routine x 2)     Status: None (Preliminary result)   Collection Time: 03/14/16  3:04 PM  Result Value Ref Range Status   Specimen Description BLOOD LEFT ANTECUBITAL  Final   Special Requests BOTTLES DRAWN AEROBIC AND ANAEROBIC 5 CC  Final   Culture   Final    NO GROWTH < 24 HOURS Performed at [facility], [address], [place], [place] [address]    Report Status PENDING  Incomplete  Blood Culture (routine x 2)     Status: None (Preliminary result)   Collection Time: 03/14/16  3:59 PM  Result Value Ref Range Status   Specimen Description BLOOD RIGHT HAND  Final   Special Requests BOTTLES DRAWN AEROBIC AND ANAEROBIC 5CC  Final   Culture   Final    NO GROWTH < 24 HOURS Performed at [facility], [address], [place], [place] [address]    Report Status PENDING  Incomplete  Respiratory Panel by PCR     Status: Abnormal   Collection Time: 03/14/16  8:26 PM  Result Value Ref Range Status   Adenovirus NOT DETECTED NOT DETECTED Final   Coronavirus 229E NOT DETECTED NOT DETECTED Final   Coronavirus HKU1 NOT DETECTED NOT DETECTED Final   Coronavirus NL63 NOT DETECTED NOT DETECTED Final   Coronavirus OC43 NOT DETECTED NOT DETECTED Final   Metapneumovirus NOT DETECTED NOT DETECTED Final   Rhinovirus / Enterovirus NOT DETECTED NOT DETECTED Final   Influenza A H3 DETECTED (A) NOT DETECTED Final   Influenza B NOT DETECTED NOT DETECTED Final   Parainfluenza Virus 1 NOT DETECTED NOT DETECTED Final   Parainfluenza Virus 2  NOT DETECTED NOT DETECTED Final   Parainfluenza Virus 3 NOT DETECTED NOT DETECTED Final   Parainfluenza Virus 4 NOT DETECTED NOT DETECTED Final   Respiratory Syncytial Virus NOT DETECTED NOT DETECTED Final   Bordetella pertussis NOT DETECTED NOT DETECTED Final   Chlamydophila pneumoniae NOT DETECTED NOT DETECTED Final   Mycoplasma pneumoniae NOT DETECTED NOT DETECTED Final    Comment: Performed at [facility], [address], [place], [place] [address]     Time coordinating discharge: Over 30 minutes  SIGNED:   Jacquelin Hawking, MD [facility] 03/15/2016, 3:44 PM Pager [phone]  If 7PM-7AM, please contact night-coverage [email or web address] Password [email or web address]

## 2016-03-15 NOTE — Progress Notes (Signed)
Patient was ambulated by NT to the nursing station and back.  O2 Sat this am upon ambulation (without oxygen) stayed between 95-96 percent on room air.May need to be repeated again during the day when patient feels up to walking a longer distance.[place], MeKenton Kingfisherredith [place]

## 2016-03-15 NOTE — Discharge Instructions (Addendum)
Dylan Harrington,  You were admitted because of difficulty breathing and initially treated for a COPD exacerbation. Your flu test was positive, which likely contributed to your issues breathing. Your oxygen was weaned off and you received treatment for your COPD and influenza. Please continue this treatment as an outpatient. Please follow-up with your primary care physician.  Jacquelin Hawkingalph Nancyann Cotterman, MD [facility]

## 2016-03-15 NOTE — Progress Notes (Signed)
Patient ambulated around unit on room air O2 sat 93%-97%

## 2016-03-19 LAB — CULTURE, BLOOD (ROUTINE X 2)
Culture: NO GROWTH
Culture: NO GROWTH

## 2020-05-07 ENCOUNTER — Other Ambulatory Visit: Payer: Self-pay

## 2020-05-07 ENCOUNTER — Inpatient Hospital Stay (HOSPITAL_COMMUNITY)
Admission: EM | Admit: 2020-05-07 | Discharge: 2020-05-19 | DRG: 853 | Disposition: A | Discharge status: Skilled Nursing Facility | Payer: Medicare HMO | Attending: Internal Medicine | Admitting: Internal Medicine

## 2020-05-07 ENCOUNTER — Emergency Department (HOSPITAL_COMMUNITY): Payer: Medicare HMO

## 2020-05-07 DIAGNOSIS — L03115 Cellulitis of right lower limb: Secondary | ICD-10-CM | POA: Diagnosis not present

## 2020-05-07 DIAGNOSIS — I96 Gangrene, not elsewhere classified: Secondary | ICD-10-CM | POA: Diagnosis present

## 2020-05-07 DIAGNOSIS — L97519 Non-pressure chronic ulcer of other part of right foot with unspecified severity: Secondary | ICD-10-CM | POA: Diagnosis present

## 2020-05-07 DIAGNOSIS — E872 Acidosis, unspecified: Secondary | ICD-10-CM | POA: Diagnosis present

## 2020-05-07 DIAGNOSIS — E43 Unspecified severe protein-calorie malnutrition: Secondary | ICD-10-CM | POA: Insufficient documentation

## 2020-05-07 DIAGNOSIS — Z23 Encounter for immunization: Secondary | ICD-10-CM

## 2020-05-07 DIAGNOSIS — I10 Essential (primary) hypertension: Secondary | ICD-10-CM | POA: Diagnosis present

## 2020-05-07 DIAGNOSIS — N400 Enlarged prostate without lower urinary tract symptoms: Secondary | ICD-10-CM | POA: Diagnosis present

## 2020-05-07 DIAGNOSIS — I129 Hypertensive chronic kidney disease with stage 1 through stage 4 chronic kidney disease, or unspecified chronic kidney disease: Secondary | ICD-10-CM | POA: Diagnosis present

## 2020-05-07 DIAGNOSIS — F17210 Nicotine dependence, cigarettes, uncomplicated: Secondary | ICD-10-CM | POA: Diagnosis present

## 2020-05-07 DIAGNOSIS — Z96652 Presence of left artificial knee joint: Secondary | ICD-10-CM | POA: Diagnosis present

## 2020-05-07 DIAGNOSIS — J449 Chronic obstructive pulmonary disease, unspecified: Secondary | ICD-10-CM

## 2020-05-07 DIAGNOSIS — A4102 Sepsis due to Methicillin resistant Staphylococcus aureus: Secondary | ICD-10-CM | POA: Diagnosis not present

## 2020-05-07 DIAGNOSIS — Z6828 Body mass index (BMI) 28.0-28.9, adult: Secondary | ICD-10-CM

## 2020-05-07 DIAGNOSIS — E876 Hypokalemia: Secondary | ICD-10-CM | POA: Diagnosis present

## 2020-05-07 DIAGNOSIS — W269XXA Contact with unspecified sharp object(s), initial encounter: Secondary | ICD-10-CM

## 2020-05-07 DIAGNOSIS — N179 Acute kidney failure, unspecified: Secondary | ICD-10-CM | POA: Diagnosis present

## 2020-05-07 DIAGNOSIS — Z20822 Contact with and (suspected) exposure to covid-19: Secondary | ICD-10-CM | POA: Diagnosis present

## 2020-05-07 DIAGNOSIS — Z950 Presence of cardiac pacemaker: Secondary | ICD-10-CM

## 2020-05-07 DIAGNOSIS — R062 Wheezing: Secondary | ICD-10-CM

## 2020-05-07 DIAGNOSIS — R6520 Severe sepsis without septic shock: Secondary | ICD-10-CM | POA: Diagnosis present

## 2020-05-07 DIAGNOSIS — Z881 Allergy status to other antibiotic agents status: Secondary | ICD-10-CM

## 2020-05-07 DIAGNOSIS — E869 Volume depletion, unspecified: Secondary | ICD-10-CM | POA: Diagnosis present

## 2020-05-07 DIAGNOSIS — Y92010 Kitchen of single-family (private) house as the place of occurrence of the external cause: Secondary | ICD-10-CM

## 2020-05-07 DIAGNOSIS — L02611 Cutaneous abscess of right foot: Secondary | ICD-10-CM | POA: Diagnosis present

## 2020-05-07 DIAGNOSIS — Z79899 Other long term (current) drug therapy: Secondary | ICD-10-CM

## 2020-05-07 DIAGNOSIS — M25561 Pain in right knee: Secondary | ICD-10-CM

## 2020-05-07 DIAGNOSIS — N182 Chronic kidney disease, stage 2 (mild): Secondary | ICD-10-CM | POA: Diagnosis present

## 2020-05-07 DIAGNOSIS — R7303 Prediabetes: Secondary | ICD-10-CM | POA: Diagnosis present

## 2020-05-07 DIAGNOSIS — G4733 Obstructive sleep apnea (adult) (pediatric): Secondary | ICD-10-CM | POA: Diagnosis present

## 2020-05-07 DIAGNOSIS — M25461 Effusion, right knee: Secondary | ICD-10-CM | POA: Diagnosis not present

## 2020-05-07 DIAGNOSIS — H409 Unspecified glaucoma: Secondary | ICD-10-CM | POA: Diagnosis present

## 2020-05-07 DIAGNOSIS — G629 Polyneuropathy, unspecified: Secondary | ICD-10-CM | POA: Diagnosis present

## 2020-05-07 DIAGNOSIS — L97509 Non-pressure chronic ulcer of other part of unspecified foot with unspecified severity: Secondary | ICD-10-CM | POA: Diagnosis present

## 2020-05-07 NOTE — ED Triage Notes (Signed)
Right foot pain x 1 week with pus draining from foot. Currently on oral abx. Unsure of fever.

## 2020-05-08 ENCOUNTER — Encounter (HOSPITAL_COMMUNITY)
Admission: EM | Disposition: A | Discharge status: Skilled Nursing Facility | Payer: Self-pay | Source: Home / Self Care | Attending: Internal Medicine

## 2020-05-08 ENCOUNTER — Emergency Department (HOSPITAL_COMMUNITY): Payer: Medicare HMO

## 2020-05-08 ENCOUNTER — Encounter (HOSPITAL_COMMUNITY): Payer: Self-pay | Admitting: Internal Medicine

## 2020-05-08 DIAGNOSIS — F17210 Nicotine dependence, cigarettes, uncomplicated: Secondary | ICD-10-CM | POA: Diagnosis present

## 2020-05-08 DIAGNOSIS — Z23 Encounter for immunization: Secondary | ICD-10-CM | POA: Diagnosis present

## 2020-05-08 DIAGNOSIS — Z881 Allergy status to other antibiotic agents status: Secondary | ICD-10-CM | POA: Diagnosis not present

## 2020-05-08 DIAGNOSIS — L97519 Non-pressure chronic ulcer of other part of right foot with unspecified severity: Secondary | ICD-10-CM

## 2020-05-08 DIAGNOSIS — B9562 Methicillin resistant Staphylococcus aureus infection as the cause of diseases classified elsewhere: Secondary | ICD-10-CM | POA: Diagnosis not present

## 2020-05-08 DIAGNOSIS — W269XXA Contact with unspecified sharp object(s), initial encounter: Secondary | ICD-10-CM | POA: Diagnosis not present

## 2020-05-08 DIAGNOSIS — E43 Unspecified severe protein-calorie malnutrition: Secondary | ICD-10-CM | POA: Insufficient documentation

## 2020-05-08 DIAGNOSIS — A4102 Sepsis due to Methicillin resistant Staphylococcus aureus: Secondary | ICD-10-CM | POA: Diagnosis present

## 2020-05-08 DIAGNOSIS — L03115 Cellulitis of right lower limb: Secondary | ICD-10-CM

## 2020-05-08 DIAGNOSIS — L97509 Non-pressure chronic ulcer of other part of unspecified foot with unspecified severity: Secondary | ICD-10-CM | POA: Diagnosis present

## 2020-05-08 DIAGNOSIS — H409 Unspecified glaucoma: Secondary | ICD-10-CM | POA: Diagnosis present

## 2020-05-08 DIAGNOSIS — L89896 Pressure-induced deep tissue damage of other site: Secondary | ICD-10-CM | POA: Diagnosis not present

## 2020-05-08 DIAGNOSIS — N182 Chronic kidney disease, stage 2 (mild): Secondary | ICD-10-CM | POA: Diagnosis present

## 2020-05-08 DIAGNOSIS — L97513 Non-pressure chronic ulcer of other part of right foot with necrosis of muscle: Secondary | ICD-10-CM

## 2020-05-08 DIAGNOSIS — A419 Sepsis, unspecified organism: Secondary | ICD-10-CM | POA: Diagnosis not present

## 2020-05-08 DIAGNOSIS — Z950 Presence of cardiac pacemaker: Secondary | ICD-10-CM | POA: Diagnosis not present

## 2020-05-08 DIAGNOSIS — E872 Acidosis, unspecified: Secondary | ICD-10-CM | POA: Diagnosis present

## 2020-05-08 DIAGNOSIS — Z79899 Other long term (current) drug therapy: Secondary | ICD-10-CM | POA: Diagnosis not present

## 2020-05-08 DIAGNOSIS — J449 Chronic obstructive pulmonary disease, unspecified: Secondary | ICD-10-CM | POA: Diagnosis present

## 2020-05-08 DIAGNOSIS — R7303 Prediabetes: Secondary | ICD-10-CM | POA: Diagnosis present

## 2020-05-08 DIAGNOSIS — L02611 Cutaneous abscess of right foot: Secondary | ICD-10-CM

## 2020-05-08 DIAGNOSIS — Z20822 Contact with and (suspected) exposure to covid-19: Secondary | ICD-10-CM | POA: Diagnosis present

## 2020-05-08 DIAGNOSIS — Y92010 Kitchen of single-family (private) house as the place of occurrence of the external cause: Secondary | ICD-10-CM | POA: Diagnosis not present

## 2020-05-08 DIAGNOSIS — R6520 Severe sepsis without septic shock: Secondary | ICD-10-CM | POA: Diagnosis present

## 2020-05-08 DIAGNOSIS — I96 Gangrene, not elsewhere classified: Secondary | ICD-10-CM | POA: Diagnosis present

## 2020-05-08 DIAGNOSIS — E869 Volume depletion, unspecified: Secondary | ICD-10-CM | POA: Diagnosis present

## 2020-05-08 DIAGNOSIS — N400 Enlarged prostate without lower urinary tract symptoms: Secondary | ICD-10-CM | POA: Diagnosis present

## 2020-05-08 DIAGNOSIS — I10 Essential (primary) hypertension: Secondary | ICD-10-CM

## 2020-05-08 DIAGNOSIS — E876 Hypokalemia: Secondary | ICD-10-CM | POA: Diagnosis present

## 2020-05-08 DIAGNOSIS — I129 Hypertensive chronic kidney disease with stage 1 through stage 4 chronic kidney disease, or unspecified chronic kidney disease: Secondary | ICD-10-CM | POA: Diagnosis present

## 2020-05-08 DIAGNOSIS — G629 Polyneuropathy, unspecified: Secondary | ICD-10-CM | POA: Diagnosis present

## 2020-05-08 DIAGNOSIS — N179 Acute kidney failure, unspecified: Secondary | ICD-10-CM | POA: Diagnosis present

## 2020-05-08 DIAGNOSIS — G4733 Obstructive sleep apnea (adult) (pediatric): Secondary | ICD-10-CM | POA: Diagnosis present

## 2020-05-08 LAB — APTT: aPTT: 35 seconds (ref 24–36)

## 2020-05-08 LAB — CBC WITH DIFFERENTIAL/PLATELET
Abs Immature Granulocytes: 0.14 10*3/uL — ABNORMAL HIGH (ref 0.00–0.07)
Abs Immature Granulocytes: 0.19 10*3/uL — ABNORMAL HIGH (ref 0.00–0.07)
Basophils Absolute: 0 10*3/uL (ref 0.0–0.1)
Basophils Absolute: 0 10*3/uL (ref 0.0–0.1)
Basophils Relative: 0 %
Basophils Relative: 0 %
Eosinophils Absolute: 0.1 10*3/uL (ref 0.0–0.5)
Eosinophils Absolute: 0.1 10*3/uL (ref 0.0–0.5)
Eosinophils Relative: 0 %
Eosinophils Relative: 1 %
HCT: 40.2 % (ref 39.0–52.0)
HCT: 41.7 % (ref 39.0–52.0)
Hemoglobin: 13.4 g/dL (ref 13.0–17.0)
Hemoglobin: 13.4 g/dL (ref 13.0–17.0)
Immature Granulocytes: 2 %
Immature Granulocytes: 2 %
Lymphocytes Relative: 16 %
Lymphocytes Relative: 17 %
Lymphs Abs: 1.5 10*3/uL (ref 0.7–4.0)
Lymphs Abs: 2 10*3/uL (ref 0.7–4.0)
MCH: 31.8 pg (ref 26.0–34.0)
MCH: 32.2 pg (ref 26.0–34.0)
MCHC: 32.1 g/dL (ref 30.0–36.0)
MCHC: 33.3 g/dL (ref 30.0–36.0)
MCV: 96.6 fL (ref 80.0–100.0)
MCV: 98.8 fL (ref 80.0–100.0)
Monocytes Absolute: 0.9 10*3/uL (ref 0.1–1.0)
Monocytes Absolute: 1 10*3/uL (ref 0.1–1.0)
Monocytes Relative: 10 %
Monocytes Relative: 8 %
Neutro Abs: 6.9 10*3/uL (ref 1.7–7.7)
Neutro Abs: 8.6 10*3/uL — ABNORMAL HIGH (ref 1.7–7.7)
Neutrophils Relative %: 71 %
Neutrophils Relative %: 73 %
Platelets: 278 10*3/uL (ref 150–400)
Platelets: 294 10*3/uL (ref 150–400)
RBC: 4.16 MIL/uL — ABNORMAL LOW (ref 4.22–5.81)
RBC: 4.22 MIL/uL (ref 4.22–5.81)
RDW: 14.2 % (ref 11.5–15.5)
RDW: 14.3 % (ref 11.5–15.5)
WBC: 11.9 10*3/uL — ABNORMAL HIGH (ref 4.0–10.5)
WBC: 9.6 10*3/uL (ref 4.0–10.5)
nRBC: 0 % (ref 0.0–0.2)
nRBC: 0 % (ref 0.0–0.2)

## 2020-05-08 LAB — COMPREHENSIVE METABOLIC PANEL
ALT: 12 U/L (ref 0–44)
ALT: 12 U/L (ref 0–44)
AST: 17 U/L (ref 15–41)
AST: 18 U/L (ref 15–41)
Albumin: 2.3 g/dL — ABNORMAL LOW (ref 3.5–5.0)
Albumin: 2.5 g/dL — ABNORMAL LOW (ref 3.5–5.0)
Alkaline Phosphatase: 49 U/L (ref 38–126)
Alkaline Phosphatase: 60 U/L (ref 38–126)
Anion gap: 10 (ref 5–15)
Anion gap: 10 (ref 5–15)
BUN: 21 mg/dL (ref 8–23)
BUN: 23 mg/dL (ref 8–23)
CO2: 26 mmol/L (ref 22–32)
CO2: 27 mmol/L (ref 22–32)
Calcium: 8.4 mg/dL — ABNORMAL LOW (ref 8.9–10.3)
Calcium: 8.5 mg/dL — ABNORMAL LOW (ref 8.9–10.3)
Chloride: 97 mmol/L — ABNORMAL LOW (ref 98–111)
Chloride: 98 mmol/L (ref 98–111)
Creatinine, Ser: 1.25 mg/dL — ABNORMAL HIGH (ref 0.61–1.24)
Creatinine, Ser: 1.42 mg/dL — ABNORMAL HIGH (ref 0.61–1.24)
GFR, Estimated: 50 mL/min — ABNORMAL LOW (ref >60)
GFR, Estimated: 59 mL/min — ABNORMAL LOW (ref >60)
Glucose, Bld: 102 mg/dL — ABNORMAL HIGH (ref 70–99)
Glucose, Bld: 113 mg/dL — ABNORMAL HIGH (ref 70–99)
Potassium: 2.9 mmol/L — ABNORMAL LOW (ref 3.5–5.1)
Potassium: 3 mmol/L — ABNORMAL LOW (ref 3.5–5.1)
Sodium: 133 mmol/L — ABNORMAL LOW (ref 135–145)
Sodium: 135 mmol/L (ref 135–145)
Total Bilirubin: 0.7 mg/dL (ref 0.3–1.2)
Total Bilirubin: 0.9 mg/dL (ref 0.3–1.2)
Total Protein: 7.8 g/dL (ref 6.5–8.1)
Total Protein: 8.3 g/dL — ABNORMAL HIGH (ref 6.5–8.1)

## 2020-05-08 LAB — PROTIME-INR
INR: 1.1 (ref 0.8–1.2)
INR: 1.1 (ref 0.8–1.2)
Prothrombin Time: 13.9 seconds (ref 11.4–15.2)
Prothrombin Time: 14.1 seconds (ref 11.4–15.2)

## 2020-05-08 LAB — URINALYSIS, ROUTINE W REFLEX MICROSCOPIC
Bacteria, UA: NONE SEEN
Bilirubin Urine: NEGATIVE
Glucose, UA: NEGATIVE mg/dL
Hgb urine dipstick: NEGATIVE
Ketones, ur: NEGATIVE mg/dL
Leukocytes,Ua: NEGATIVE
Nitrite: NEGATIVE
Protein, ur: 30 mg/dL — AB
Specific Gravity, Urine: 1.026 (ref 1.005–1.030)
pH: 5 (ref 5.0–8.0)

## 2020-05-08 LAB — HEMOGLOBIN A1C
Hgb A1c MFr Bld: 6.1 % — ABNORMAL HIGH (ref 4.8–5.6)
Mean Plasma Glucose: 128.37 mg/dL

## 2020-05-08 LAB — C-REACTIVE PROTEIN: CRP: 6.4 mg/dL — ABNORMAL HIGH (ref <1.0)

## 2020-05-08 LAB — LACTIC ACID, PLASMA
Lactic Acid, Venous: 1.7 mmol/L (ref 0.5–1.9)
Lactic Acid, Venous: 2.3 mmol/L (ref 0.5–1.9)

## 2020-05-08 LAB — MAGNESIUM: Magnesium: 1.8 mg/dL (ref 1.7–2.4)

## 2020-05-08 LAB — HIV ANTIBODY (ROUTINE TESTING W REFLEX): HIV Screen 4th Generation wRfx: NONREACTIVE

## 2020-05-08 LAB — RESP PANEL BY RT-PCR (FLU A&B, COVID) ARPGX2
Influenza A by PCR: NEGATIVE
Influenza B by PCR: NEGATIVE
SARS Coronavirus 2 by RT PCR: NEGATIVE

## 2020-05-08 SURGERY — IRRIGATION AND DEBRIDEMENT EXTREMITY
Anesthesia: General | Laterality: Right

## 2020-05-08 MED ORDER — ACETAMINOPHEN 325 MG PO TABS: 650.0000 mg | ORAL_TABLET | Freq: Four times a day (QID) | ORAL | Status: DC | PRN

## 2020-05-08 MED ORDER — ONDANSETRON HCL 4 MG/2ML IJ SOLN: 4.0000 mg | Freq: Four times a day (QID) | INTRAMUSCULAR | Status: DC | PRN

## 2020-05-08 MED ORDER — TAMSULOSIN HCL 0.4 MG PO CAPS
0.4000 mg | ORAL_CAPSULE | Freq: Every day | ORAL | Status: DC
  Administered 2020-05-08 – 2020-05-19 (×12): 0.4 mg via ORAL
  Filled 2020-05-08 (×12): qty 1

## 2020-05-08 MED ORDER — ENSURE ENLIVE PO LIQD
237.0000 mL | Freq: Three times a day (TID) | ORAL | Status: DC
  Administered 2020-05-08 – 2020-05-19 (×24): 237 mL via ORAL

## 2020-05-08 MED ORDER — POLYETHYLENE GLYCOL 3350 17 G PO PACK: 17.0000 g | PACK | Freq: Every day | ORAL | Status: DC | PRN

## 2020-05-08 MED ORDER — VANCOMYCIN HCL 1500 MG/300ML IV SOLN
1500.0000 mg | Freq: Once | INTRAVENOUS | Status: AC
  Administered 2020-05-08: 1500 mg via INTRAVENOUS
  Filled 2020-05-08: qty 300

## 2020-05-08 MED ORDER — CHLORHEXIDINE GLUCONATE 0.12 % MT SOLN
15.0000 mL | OROMUCOSAL | Status: AC
  Administered 2020-05-09: 15 mL via OROMUCOSAL
  Filled 2020-05-08 (×2): qty 15

## 2020-05-08 MED ORDER — ALBUTEROL SULFATE (2.5 MG/3ML) 0.083% IN NEBU: 2.5000 mg | INHALATION_SOLUTION | RESPIRATORY_TRACT | Status: DC | PRN

## 2020-05-08 MED ORDER — LACTATED RINGERS IV SOLN: INTRAVENOUS | Status: DC

## 2020-05-08 MED ORDER — ONDANSETRON HCL 4 MG PO TABS: 4.0000 mg | ORAL_TABLET | Freq: Four times a day (QID) | ORAL | Status: DC | PRN

## 2020-05-08 MED ORDER — HYDROCHLOROTHIAZIDE 25 MG PO TABS
25.0000 mg | ORAL_TABLET | Freq: Every day | ORAL | Status: DC
  Administered 2020-05-08: 25 mg via ORAL
  Filled 2020-05-08: qty 1

## 2020-05-08 MED ORDER — LISINOPRIL-HYDROCHLOROTHIAZIDE 20-25 MG PO TABS: 1.0000 | ORAL_TABLET | Freq: Two times a day (BID) | ORAL | Status: DC

## 2020-05-08 MED ORDER — MORPHINE SULFATE (PF) 2 MG/ML IV SOLN: 2.0000 mg | INTRAVENOUS | Status: DC | PRN

## 2020-05-08 MED ORDER — PROSOURCE PLUS PO LIQD
30.0000 mL | Freq: Two times a day (BID) | ORAL | Status: DC
  Administered 2020-05-10 – 2020-05-13 (×8): 30 mL via ORAL
  Filled 2020-05-08 (×9): qty 30

## 2020-05-08 MED ORDER — GABAPENTIN 300 MG PO CAPS
300.0000 mg | ORAL_CAPSULE | Freq: Every day | ORAL | Status: DC
  Administered 2020-05-08 – 2020-05-18 (×11): 300 mg via ORAL
  Filled 2020-05-08 (×11): qty 1

## 2020-05-08 MED ORDER — METOPROLOL TARTRATE 25 MG PO TABS
25.0000 mg | ORAL_TABLET | Freq: Every day | ORAL | Status: DC
  Administered 2020-05-08 – 2020-05-19 (×12): 25 mg via ORAL
  Filled 2020-05-08 (×12): qty 1

## 2020-05-08 MED ORDER — POTASSIUM CHLORIDE CRYS ER 20 MEQ PO TBCR
40.0000 meq | EXTENDED_RELEASE_TABLET | ORAL | Status: AC
  Administered 2020-05-08 (×2): 40 meq via ORAL
  Filled 2020-05-08 (×2): qty 2

## 2020-05-08 MED ORDER — ENOXAPARIN SODIUM 40 MG/0.4ML ~~LOC~~ SOLN
40.0000 mg | SUBCUTANEOUS | Status: DC
  Administered 2020-05-10 – 2020-05-19 (×10): 40 mg via SUBCUTANEOUS
  Filled 2020-05-08 (×10): qty 0.4

## 2020-05-08 MED ORDER — GUAIFENESIN ER 600 MG PO TB12
600.0000 mg | ORAL_TABLET | Freq: Two times a day (BID) | ORAL | Status: DC
  Administered 2020-05-08 – 2020-05-11 (×8): 600 mg via ORAL
  Filled 2020-05-08 (×8): qty 1

## 2020-05-08 MED ORDER — ADULT MULTIVITAMIN W/MINERALS CH
1.0000 | ORAL_TABLET | Freq: Every day | ORAL | Status: DC
  Administered 2020-05-09 – 2020-05-19 (×11): 1 via ORAL
  Filled 2020-05-08 (×11): qty 1

## 2020-05-08 MED ORDER — SODIUM CHLORIDE 0.9 % IV SOLN
1.0000 g | Freq: Three times a day (TID) | INTRAVENOUS | Status: DC
  Administered 2020-05-08 – 2020-05-11 (×9): 1 g via INTRAVENOUS
  Filled 2020-05-08 (×11): qty 1

## 2020-05-08 MED ORDER — VANCOMYCIN HCL 750 MG/150ML IV SOLN
750.0000 mg | Freq: Two times a day (BID) | INTRAVENOUS | Status: DC
  Administered 2020-05-08 – 2020-05-19 (×22): 750 mg via INTRAVENOUS
  Filled 2020-05-08 (×23): qty 150

## 2020-05-08 MED ORDER — LISINOPRIL 20 MG PO TABS
20.0000 mg | ORAL_TABLET | Freq: Every day | ORAL | Status: DC
  Administered 2020-05-08: 20 mg via ORAL
  Filled 2020-05-08: qty 1

## 2020-05-08 MED ORDER — HYDRALAZINE HCL 25 MG PO TABS: 25.0000 mg | ORAL_TABLET | Freq: Three times a day (TID) | ORAL | Status: DC | PRN

## 2020-05-08 MED ORDER — OXYCODONE-ACETAMINOPHEN 5-325 MG PO TABS
1.0000 | ORAL_TABLET | ORAL | Status: DC | PRN
  Administered 2020-05-08: 1 via ORAL
  Filled 2020-05-08: qty 1

## 2020-05-08 MED ORDER — ACETAMINOPHEN 650 MG RE SUPP: 650.0000 mg | Freq: Four times a day (QID) | RECTAL | Status: DC | PRN

## 2020-05-08 NOTE — H&P (Addendum)
History and Physical    Arville LimeMelvin E Strohm ZOX:[id number] DOB: [birth year] DOA: 05/07/2020  PCP: Winifred OliveKirkman, Paul, MD  Patient coming from: Home   Chief Complaint:  Chief Complaint  Patient presents with   Foot Problem   Wound Infection     HPI:    80 year old male with past medical history of nicotine dependence, benign prostatic hyperplasia, COPD, hypertension and obstructive sleep apnea who presents to [facility] emergency department with complaints of right foot pain and ulceration.  Patient explains that approximately 9 days ago he was walking to his living room when he "stepped on something sharp."  Patient is unable to recall exactly what he stepped on.  Patient states that this resulted in a small wound on the bottom of his foot.  Over the following 48 hours patient rapidly developed severe pain in the right foot associated redness and swelling.  Patient then presented to see his primary care provider who prescribed him with a course of Bactrim.  Patient proceeded to take this Bactrim as directed twice daily but unfortunately this resulted in no improvement in his symptoms.  In the days that followed patient continued to experience progressively worsening pain of the right foot with associated swelling and redness.  Patient describes the pain as sharp in quality, severe in intensity, worse with weightbearing or movement of the affected extremity and nonradiating.  Patient denies associated fever.  Patient denies any change in appetite.  Patient does complain of progressively worsening associated weakness however.  Patient notes that as his symptoms continue to progressively worsen he began to drain purulent drainage from the ulceration as it began to grow.  Due to patient's progressively worsening symptoms the patient eventually presented to [facility] emergency department for evaluation.  Upon evaluation in the emergency department, clinically the patient was felt to  have substantial cellulitis with associated ulceration of the plantar surface of the right foot.  Patient was also found to have associated lactic acidosis with lactic acid of 2.3 and mild leukocytosis of 11.9.  Patient was initiated on intravenous vancomycin and meropenem by the emergency department staff.  The hospitalist group was then called to assess the patient for admission the hospital.  Review of Systems:   Review of Systems  Musculoskeletal:       Right foot pain  Neurological: Positive for weakness.  All other systems reviewed and are negative.   Past Medical History:  Diagnosis Date   COPD (chronic obstructive pulmonary disease) (HCC)    Glaucoma    Hypertension    Obstructive sleep apnea 03/15/2016   Pacemaker    Renal disorder    Sleep apnea     Past Surgical History:  Procedure Laterality Date   KIDNEY STONE SURGERY     MEDIAL PARTIAL KNEE REPLACEMENT     PACEMAKER INSERTION     SHOULDER SURGERY     SPINE SURGERY     stents       reports that he has been smoking cigarettes. He has been smoking about 0.00 packs per day. He has never used smokeless tobacco. He reports current alcohol use. He reports that he does not use drugs.  Allergies  Allergen Reactions   Keflex [Cephalexin] Hives, Rash and Other (See Comments)    Family History  Problem Relation Age of Onset   Heart disease Neg Hx      Prior to Admission medications   Medication Sig Start Date End Date Taking? Authorizing Provider  gabapentin (NEURONTIN) 100  MG capsule Take 300 mg by mouth at bedtime.   Yes [provider]  guaiFENesin (MUCINEX) 600 MG 12 hr tablet Take 600 mg by mouth 2 (two) times daily.   Yes [provider]  lisinopril-hydrochlorothiazide (PRINZIDE,ZESTORETIC) 20-25 MG per tablet Take 1 tablet by mouth 2 (two) times daily.    Yes [provider]  metoprolol tartrate (LOPRESSOR) 25 MG tablet Take 25 mg by mouth daily. 05/06/20  Yes  [provider]  tamsulosin (FLOMAX) 0.4 MG CAPS capsule Take 1 capsule (0.4 mg total) by mouth daily. 08/21/14  Yes Marisa Severin, MD  zolpidem (AMBIEN) 10 MG tablet Take 10 mg by mouth at bedtime. 04/21/20  Yes [provider]  albuterol (PROVENTIL HFA;VENTOLIN HFA) 108 (90 BASE) MCG/ACT inhaler Inhale 2 puffs into the lungs 4 (four) times daily. 07/28/13   [name], MD  docusate sodium (COLACE) 100 MG capsule Take 1 capsule (100 mg total) by mouth every 12 (twelve) hours. 08/21/14   Marisa Severin, MD    Physical Exam: Vitals:   05/08/20 0245 05/08/20 0300 05/08/20 0317 05/08/20 0353  BP: (!) 142/84 (!) 151/81  135/73  Pulse: 83 93  76  Resp: (!) 24 20    Temp:   98.6 F (37 C) 98.9 F (37.2 C)  TempSrc:   Oral Oral  SpO2: 98% 100%  97%  Weight:      Height:        Constitutional: Awake alert and oriented x3, no associated distress.   Skin: Approximately 3 cm diameter wound on the plantar surface of the right foot, purulent in appearance without foul smell and some active drainage.  Otherwise, poor skin turgor noted. Eyes: Pupils are equally reactive to light.  No evidence of scleral icterus or conjunctival pallor.  ENMT: Dry mucous membranes noted.  Posterior pharynx clear of any exudate or lesions.   Neck: normal, supple, no masses, no thyromegaly.  No evidence of jugular venous distension.   Respiratory: clear to auscultation bilaterally, no wheezing, no crackles. Normal respiratory effort. No accessory muscle use.  Cardiovascular: Regular rate and rhythm, no murmurs / rubs / gallops. No extremity edema. 2+ pedal pulses. No carotid bruits.  Chest:   Nontender without crepitus or deformity.   Back:   Nontender without crepitus or deformity. Abdomen: Abdomen is soft and nontender.  No evidence of intra-abdominal masses.  Positive bowel sounds noted in all quadrants.   Musculoskeletal: Significant tenderness of the right foot with skin examination findings as noted  above.  No joint deformity upper and lower extremities. Good ROM, no contractures. Normal muscle tone.  Neurologic: CN 2-12 grossly intact. Sensation intact.  Patient moving all 4 extremities spontaneously.  Patient is following all commands.  Patient is responsive to verbal stimuli.   Psychiatric: Patient exhibits normal mood with appropriate affect.  Patient seems to possess insight as to their current situation.     Labs on Admission: I have personally reviewed following labs and imaging studies -   CBC: Recent Labs  Lab 05/07/20 2333  WBC 11.9*  NEUTROABS 8.6*  HGB 13.4  HCT 41.7  MCV 98.8  PLT 294   Basic Metabolic Panel: Recent Labs  Lab 05/07/20 2333  NA 133*  K 2.9*  CL 97*  CO2 26  GLUCOSE 113*  BUN 23  CREATININE 1.42*  CALCIUM 8.5*   GFR: Estimated Creatinine Clearance: 50.3 mL/min (A) (by C-G formula based on SCr of 1.42 mg/dL (H)). Liver Function Tests: Recent Labs  Lab 05/07/20 2333  AST 18  ALT 12  ALKPHOS 60  BILITOT 0.7  PROT 8.3*  ALBUMIN 2.5*   No results for input(s): LIPASE, AMYLASE in the last 168 hours. No results for input(s): AMMONIA in the last 168 hours. Coagulation Profile: Recent Labs  Lab 05/07/20 2333  INR 1.1   Cardiac Enzymes: No results for input(s): CKTOTAL, CKMB, CKMBINDEX, TROPONINI in the last 168 hours. BNP (last 3 results) No results for input(s): PROBNP in the last 8760 hours. HbA1C: No results for input(s): HGBA1C in the last 72 hours. CBG: No results for input(s): GLUCAP in the last 168 hours. Lipid Profile: No results for input(s): CHOL, HDL, LDLCALC, TRIG, CHOLHDL, LDLDIRECT in the last 72 hours. Thyroid Function Tests: No results for input(s): TSH, T4TOTAL, FREET4, T3FREE, THYROIDAB in the last 72 hours. Anemia Panel: No results for input(s): VITAMINB12, FOLATE, FERRITIN, TIBC, IRON, RETICCTPCT in the last 72 hours. Urine analysis:    Component Value Date/Time   COLORURINE AMBER (A) 05/07/2020 2310    APPEARANCEUR HAZY (A) 05/07/2020 2310   LABSPEC 1.026 05/07/2020 2310   PHURINE 5.0 05/07/2020 2310   GLUCOSEU NEGATIVE 05/07/2020 2310   HGBUR NEGATIVE 05/07/2020 2310   BILIRUBINUR NEGATIVE 05/07/2020 2310   KETONESUR NEGATIVE 05/07/2020 2310   PROTEINUR 30 (A) 05/07/2020 2310   UROBILINOGEN 2.0 (H) 08/20/2014 2345   NITRITE NEGATIVE 05/07/2020 2310   LEUKOCYTESUR NEGATIVE 05/07/2020 2310    Radiological Exams on Admission - Personally Reviewed: DG Chest 2 View  Result Date: 05/07/2020 CLINICAL DATA:  Right foot pain, drainage, sepsis EXAM: CHEST - 2 VIEW COMPARISON:  03/14/2016 FINDINGS: Frontal and lateral views of the chest demonstrate stable dual lead pacer. Cardiac silhouette is unremarkable. No airspace disease, effusion, or pneumothorax. Stable degenerative changes right shoulder. No acute bony abnormalities. IMPRESSION: 1. No acute intrathoracic process. Electronically Signed   By: Sharlet Salina M.D.   On: 05/07/2020 23:37   DG Foot Complete Right  Result Date: 05/08/2020 CLINICAL DATA:  Infection EXAM: RIGHT FOOT COMPLETE - 3+ VIEW COMPARISON:  None. FINDINGS: There is no evidence of fracture or dislocation. 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 There is calcaneal enthesopathy. Focal area of ulceration on the mid plantar surface with overlying subcutaneous edema. IMPRESSION: Area of ulceration on the plantar surface with overlying soft tissue swelling. No definite evidence of acute osteomyelitis. Electronically Signed   By: Jonna Clark M.D.   On: 05/08/2020 01:40     Assessment/Plan Principal Problem:   Cellulitis of right foot   Patient presenting with significant ulceration of the plantar surface of the right foot with surrounding cellulitis  Considering surprising progression of this necrotic appearing ulceration with purulence I am obtaining hemoglobin A1c and HIV testing  Patient has been placed on extremely broad-spectrum intravenous  antibiotics by the emergency department staff including vancomycin and meropenem.  We will keep this going for now.  Drainage culture and blood cultures have been obtained.  Hydrating patient with intravenous isotonic fluids  As needed opiate-based analgesics for associated pain  Wound care consultation placed for dressing changes and debridement of wound.  Consider consultation of Dr. Lajoyce Corners for evaluation on day shift  Active Problems:   Skin ulcer of plantar aspect of foot (HCC)   Please see assessment and plan above  Lactic acidosis   Patient exhibiting lactic acidosis, likely secondary to infection volume depletion  Hydrating patient with intravenous isotonic fluids, treating underlying infection with broad-spectrum antibiotics  Performing serial lactic  acid levels to ensure downtrending and resolution  COPD (chronic obstructive pulmonary disease) (HCC)   No evidence of COPD exacerbation  As needed bronchodilator therapy for shortness of breath and wheezing    Essential hypertension   Resume patients home regimen or oral antihypertensives  Titrate antihypertensive regimen as necessary to achieve adequate BP control  PRN intravenous antihypertensives for excessively elevated blood pressure    Nicotine dependence, cigarettes, uncomplicated   Patient is being counseled daily on smoking cessation.    BPH without urinary obstruction   Continue home regimen of Flomax   Code Status:  Full code Family Communication: deferred   Status is: Observation  The patient remains OBS appropriate and will d/c before 2 midnights.  Dispo: The patient is from: Home              Anticipated d/c is to: Home              Patient currently is not medically stable to d/c.   Difficult to place patient No        Marinda Elk MD [facility] Pager [phone]  If 7PM-7AM, please contact night-coverage [email or web address] Use universal  password for  that web site. If you do not have the password, please call the hospital operator.  05/08/2020, 5:22 AM

## 2020-05-08 NOTE — Consult Note (Signed)
WOC Nurse Consult Note: Patient receiving care in [facility] 5N21.  I have collaborated with Dr. Eric [place] via Secure Chat on POC for the patient. Reason for Consult: treatment and debridement of right plantar foot wound Wound type: traumatic injury with infection Pressure Injury POA: Yes/No/NA Measurement: Wound bed: Drainage (amount, consistency, odor)  Periwound: Dressing procedure/placement/frequency: Dr. [place] will or has pursued, requesting a surgical consult.  Debridement of this wound exceeds the scope of practice of the WOC nurse. The patient was not seen and will not be followed. WOC nurse will not follow at this time.  Please re-consult the WOC team if needed.  Helmut Muster, RN, MSN, CWOCN, CNS-BC, pager [phone]

## 2020-05-08 NOTE — ED Notes (Signed)
Report called to [place], RN of [phone]

## 2020-05-08 NOTE — Progress Notes (Addendum)
Patient consumed a full breakfast at 10am. Anesthesia made aware. Surgery rescheduled for tomorrow per anesthesia. Called report to [place], Charity fundraiser.

## 2020-05-08 NOTE — Progress Notes (Signed)
Pharmacy Antibiotic Note  Dylan Harrington is a 80 y.o. male admitted on 05/07/2020 with wound infection.  Pharmacy has been consulted for Vancomycin/Merrem dosing. Appears to have failed outpatient Bactrim. Mild bump in Scr.   Plan: Vancomycin 1500 mg IV x 1, then 750 mg IV q12h >>Estimated AUC: 435 Merrem 1g IV q8h Trend WBC, temp, renal function  F/U infectious work-up Drug levels as indicated   Height: 6' (182.9 cm) Weight: 94.3 kg (207 lb 14.3 oz) IBW/kg (Calculated) : 77.6  Temp (24hrs), Avg:98.2 F (36.8 C), Min:98.2 F (36.8 C), Max:98.2 F (36.8 C)  Recent Labs  Lab 05/07/20 2333 05/08/20 0140  WBC 11.9*  --   CREATININE 1.42*  --   LATICACIDVEN 2.3* 1.7    Estimated Creatinine Clearance: 50.3 mL/min (A) (by C-G formula based on SCr of 1.42 mg/dL (H)).    Allergies  Allergen Reactions  . Keflex [Cephalexin] Hives, Rash and Other (See Comments)    [name], PharmD, BCPS Clinical Pharmacist Phone: [phone]

## 2020-05-08 NOTE — Progress Notes (Signed)
Pt being transferred to pre-op.

## 2020-05-08 NOTE — Progress Notes (Signed)
Patient has his wallet in his back pocket of his jeans in the room with him. He does not want it locked up with security. Patient aware of valuables policy.

## 2020-05-08 NOTE — Plan of Care (Signed)
  Problem: Safety: Goal: Ability to remain free from injury will improve Outcome: Progressing   

## 2020-05-08 NOTE — ED Provider Notes (Signed)
[facility] EMERGENCY DEPARTMENT Provider Note   CSN: [id number] Arrival date & time: 05/07/20  2259     History Chief Complaint  Patient presents with  . Foot Problem  . Wound Infection    Dylan Harrington is a 80 y.o. male.  Patient presents to the emergency department for evaluation of worsening infection of right foot.  Patient reports that he first noticed the wound about 9 days ago.  Patient went to urgent care and was placed on Bactrim.  He has been on the antibiotic for about a week and reports that the area is worsening.  He has had a fair amount of pus draining from the bottom of his foot.  He does not know if he has had a fever.        Past Medical History:  Diagnosis Date  . COPD (chronic obstructive pulmonary disease) (HCC)   . Glaucoma   . Hypertension   . Pacemaker   . Renal disorder   . Sleep apnea     Patient Active Problem List   Diagnosis Date Noted  . Influenza with respiratory manifestation 03/15/2016  . Acute respiratory failure with hypoxia (HCC) 03/15/2016  . Obstructive sleep apnea 03/15/2016  . Tobacco abuse 03/15/2016  . Essential hypertension 03/15/2016  . COPD exacerbation (HCC) 03/14/2016    Past Surgical History:  Procedure Laterality Date  . KIDNEY STONE SURGERY    . MEDIAL PARTIAL KNEE REPLACEMENT    . PACEMAKER INSERTION    . SHOULDER SURGERY    . SPINE SURGERY    . stents         No family history on file.  Social History   Tobacco Use  . Smoking status: Heavy Tobacco Smoker    Packs/day: 0.00    Types: Cigarettes  . Smokeless tobacco: Never Used  Substance Use Topics  . Alcohol use: Yes  . Drug use: No    Home Medications Prior to Admission medications   Medication Sig Start Date End Date Taking? Authorizing Provider  albuterol (PROVENTIL HFA;VENTOLIN HFA) 108 (90 BASE) MCG/ACT inhaler Inhale 2 puffs into the lungs 4 (four) times daily. Patient not taking: Reported on 05/24/2014 07/28/13    [name], MD  docusate sodium (COLACE) 100 MG capsule Take 1 capsule (100 mg total) by mouth every 12 (twelve) hours. Patient not taking: Reported on 03/14/2016 08/21/14   Marisa Severin, MD  gabapentin (NEURONTIN) 100 MG capsule Take 100-300 mg by mouth at bedtime. Titrate up to 300 mg qhs.    [provider]  guaiFENesin (MUCINEX) 600 MG 12 hr tablet Take 600 mg by mouth 2 (two) times daily.    [provider]  lisinopril-hydrochlorothiazide (PRINZIDE,ZESTORETIC) 20-25 MG per tablet Take 1 tablet by mouth 2 (two) times daily.     [provider]  metoprolol (LOPRESSOR) 50 MG tablet Take 25 mg by mouth 2 (two) times daily.     [provider]  potassium chloride SA (K-DUR,KLOR-CON) 20 MEQ tablet Take 20 mEq by mouth 2 (two) times daily.    [provider]  tamsulosin (FLOMAX) 0.4 MG CAPS capsule Take 1 capsule (0.4 mg total) by mouth daily. Patient not taking: Reported on 03/14/2016 08/21/14   Marisa Severin, MD    Allergies    Keflex [cephalexin]  Review of Systems   Review of Systems  Skin: Positive for wound.  All other systems reviewed and are negative.   Physical Exam Updated Vital Signs BP 130/74  Pulse 86   Temp 98.2 F (36.8 C) (Oral)   Resp (!) 26   Ht 6' (1.829 m)   Wt 94.3 kg   SpO2 98%   BMI 28.20 kg/m   Physical Exam Vitals and nursing note reviewed.  Constitutional:      General: He is not in acute distress.    Appearance: Normal appearance. He is well-developed.  HENT:     Head: Normocephalic and atraumatic.     Right Ear: Hearing normal.     Left Ear: Hearing normal.     Nose: Nose normal.  Eyes:     Conjunctiva/sclera: Conjunctivae normal.     Pupils: Pupils are equal, round, and reactive to light.  Cardiovascular:     Rate and Rhythm: Regular rhythm.     Heart sounds: S1 normal and S2 normal. No murmur heard. No friction rub. No gallop.   Pulmonary:     Effort: Pulmonary effort is normal. No respiratory  distress.     Breath sounds: Normal breath sounds.  Chest:     Chest wall: No tenderness.  Abdominal:     General: Bowel sounds are normal.     Palpations: Abdomen is soft.     Tenderness: There is no abdominal tenderness. There is no guarding or rebound. Negative signs include Murphy's sign and McBurney's sign.     Hernia: No hernia is present.  Musculoskeletal:        General: Normal range of motion.     Cervical back: Normal range of motion and neck supple.  Skin:    General: Skin is warm and dry.     Findings: Wound (Ulcerated area central plantar aspect of foot with purulent drainage) present.  Neurological:     Mental Status: He is alert and oriented to person, place, and time.     GCS: GCS eye subscore is 4. GCS verbal subscore is 5. GCS motor subscore is 6.     Cranial Nerves: No cranial nerve deficit.     Sensory: No sensory deficit.     Coordination: Coordination normal.  Psychiatric:        Speech: Speech normal.        Behavior: Behavior normal.        Thought Content: Thought content normal.         ED Results / Procedures / Treatments   Labs (all labs ordered are listed, but only abnormal results are displayed) Labs Reviewed  COMPREHENSIVE METABOLIC PANEL - Abnormal; Notable for the following components:      Result Value   Sodium 133 (*)    Potassium 2.9 (*)    Chloride 97 (*)    Glucose, Bld 113 (*)    Creatinine, Ser 1.42 (*)    Calcium 8.5 (*)    Total Protein 8.3 (*)    Albumin 2.5 (*)    GFR, Estimated 50 (*)    All other components within normal limits  LACTIC ACID, PLASMA - Abnormal; Notable for the following components:   Lactic Acid, Venous 2.3 (*)    All other components within normal limits  CBC WITH DIFFERENTIAL/PLATELET - Abnormal; Notable for the following components:   WBC 11.9 (*)    Neutro Abs 8.6 (*)    Abs Immature Granulocytes 0.19 (*)    All other components within normal limits  URINALYSIS, ROUTINE W REFLEX MICROSCOPIC -  Abnormal; Notable for the following components:   Color, Urine AMBER (*)    APPearance HAZY (*)  Protein, ur 30 (*)    All other components within normal limits  CULTURE, BLOOD (ROUTINE X 2)  CULTURE, BLOOD (ROUTINE X 2)  AEROBIC CULTURE W GRAM STAIN (SUPERFICIAL SPECIMEN)  RESP PANEL BY RT-PCR (FLU A&B, COVID) ARPGX2  PROTIME-INR  LACTIC ACID, PLASMA    EKG None  Radiology DG Chest 2 View  Result Date: 05/07/2020 CLINICAL DATA:  Right foot pain, drainage, sepsis EXAM: CHEST - 2 VIEW COMPARISON:  03/14/2016 FINDINGS: Frontal and lateral views of the chest demonstrate stable dual lead pacer. Cardiac silhouette is unremarkable. No airspace disease, effusion, or pneumothorax. Stable degenerative changes right shoulder. No acute bony abnormalities. IMPRESSION: 1. No acute intrathoracic process. Electronically Signed   By: Sharlet Salina M.D.   On: 05/07/2020 23:37   DG Foot Complete Right  Result Date: 05/08/2020 CLINICAL DATA:  Infection EXAM: RIGHT FOOT COMPLETE - 3+ VIEW COMPARISON:  None. FINDINGS: There is no evidence of fracture or dislocation. 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 There is calcaneal enthesopathy. Focal area of ulceration on the mid plantar surface with overlying subcutaneous edema. IMPRESSION: Area of ulceration on the plantar surface with overlying soft tissue swelling. No definite evidence of acute osteomyelitis. Electronically Signed   By: Jonna Clark M.D.   On: 05/08/2020 01:40    Procedures Procedures   Medications Ordered in ED Medications - No data to display  ED Course  I have reviewed the triage vital signs and the nursing notes.  Pertinent labs & imaging results that were available during my care of the patient were reviewed by me and considered in my medical decision making (see chart for details).    MDM Rules/Calculators/A&P                          Patient presents to the emergency department for evaluation of open  draining area on plantar aspect of right foot.  He reports that the wound has been present for a little over a week.  He has been on Bactrim without improvement, reports worsening.  He does have swelling of the foot, surrounding erythema with an open wound that is draining pus.  This is a failure of outpatient therapy and will require hospitalization for IV antibiotics and further evaluation for osteomyelitis.  Final Clinical Impression(s) / ED Diagnoses Final diagnoses:  Cellulitis of right foot    Rx / DC Orders ED Discharge Orders    None       Amilee Janvier, Canary Brim, MD 05/08/20 0206

## 2020-05-08 NOTE — Progress Notes (Signed)
Dr. [place] notified about pt's irregular heart rhythm on tele monitor.

## 2020-05-08 NOTE — Progress Notes (Signed)
Initial Nutrition Assessment  DOCUMENTATION CODES:  Severe malnutrition in context of social or environmental circumstances  INTERVENTION:  Once medically able, add Ensure Enlive po TID, each supplement provides 350 kcal and 20 grams of protein.  Once medically able, add 30 ml ProSource Plus po BID, each supplement provides 100 kcal and 15 grams of protein.   Add MVI with minerals daily.  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  GOAL:  Patient will meet greater than or equal to 90% of their needs  MONITOR:  Diet advancement,Labs  REASON FOR ASSESSMENT:  Consult Assessment of nutrition requirement/status  ASSESSMENT:  80 yo male with a PMH of HTN, COPD, pacemaker placement, and renal disorder who presents with cellulitis of right foot.  Spoke with pt at bedside. Pt in good spirits. He reports eating well at home, having a good breakfast of eggs and bacon then for lunch and dinner, he eats a lot of beans, potatoes, and vegetables. He reports not being a huge meat eater. He also reports that he has had no change in appetite. RD noticed he had eaten 100% of breakfast tray this morning.  He says he has noticed a 20-25 lb weight loss in the last 10 months, which is approximately 8-10% wt loss.  Per exam, pt has some severe depletions in both his muscle and fat areas. He reports that he spends a lot of his time on his feet taking care of his wife who has Alzheimer's, which takes up all of his day. Pt likely neglecting self to care for wife.  Pt reports that he likes vanilla Ensure and is open to having ProSource Plus BID to help with his foot wound while he's in the hospital. Explained the importance of protein for wound healing as well as aiding  in repletion of muscle and fat.  Relevant Medications: Klor-Con 40 mEq, LR @ 125 ml/hr, vancomycin Labs: reviewed; K 3.0, Glucose 102 HbA1c: 6.1% (04/2020)  NUTRITION - FOCUSED PHYSICAL EXAM: Flowsheet Row Most Recent Value  Orbital Region Moderate depletion  Upper Arm Region Severe depletion  Thoracic and Lumbar Region Mild depletion  Buccal Region Severe depletion  Temple Region Severe depletion  Clavicle Bone Region Severe depletion  Clavicle and Acromion Bone Region Moderate depletion  Scapular Bone Region Unable to assess  Dorsal Hand Mild depletion  Patellar Region Moderate depletion  Anterior Thigh Region Moderate depletion  Posterior Calf Region Moderate depletion  Edema (RD Assessment) None  Hair Reviewed  Eyes Reviewed  [pale conjunctiva]  Mouth Reviewed  Skin Reviewed  Nails Reviewed  [pale nail beds]     Diet Order:   Diet Order            Diet NPO time specified  Diet effective now                EDUCATION NEEDS:  Education needs have been addressed  Skin:  Skin Assessment: Skin Integrity Issues: Skin Integrity Issues:: Other (Comment) Other: Wound on R foot  Last BM:  05/06/20  Height:  Ht Readings from Last 1 Encounters:  05/07/20 6' (1.829 m)   Weight:  Wt Readings from Last 1 Encounters:  05/07/20 94.3 kg   Ideal Body Weight:  81 kg  BMI:  Body mass index is 28.2 kg/m.  Estimated Nutritional Needs:  Kcal:  1800-2000 Protein:  105-120  grams Fluid:  >2 L  Vertell Limber, RD, LDN Registered Dietitian After Hours/Weekend Pager # in [place]

## 2020-05-08 NOTE — Consult Note (Addendum)
 ORTHOPAEDIC CONSULTATION  REQUESTING PHYSICIAN: [place], Eric J, DO  Chief Complaint: Purulent draining abscess plantar aspect right foot.  HPI: Dylan Harrington is a 80 y.o. male who presents with purulent draining abscess plantar aspect right foot.  Patient states he stepped on something in his kitchen about 10 days ago and has been having increasing pain odor and drainage.  Patient denies a history of diabetes he states he is a smoker history of COPD pacemaker placement and sleep apnea.  Past Medical History:  Diagnosis Date  . COPD (chronic obstructive pulmonary disease) (HCC)   . Glaucoma   . Hypertension   . Obstructive sleep apnea 03/15/2016  . Pacemaker   . Renal disorder   . Sleep apnea    Past Surgical History:  Procedure Laterality Date  . KIDNEY STONE SURGERY    . MEDIAL PARTIAL KNEE REPLACEMENT    . PACEMAKER INSERTION    . SHOULDER SURGERY    . SPINE SURGERY    . stents     Social History   Socioeconomic History  . Marital status: Married    Spouse name: Not on file  . Number of children: Not on file  . Years of education: Not on file  . Highest education level: Not on file  Occupational History  . Not on file  Tobacco Use  . Smoking status: Heavy Tobacco Smoker    Packs/day: 0.00    Types: Cigarettes  . Smokeless tobacco: Never Used  Substance and Sexual Activity  . Alcohol use: Yes  . Drug use: No  . Sexual activity: Not on file  Other Topics Concern  . Not on file  Social History Narrative  . Not on file   Social Determinants of Health   Financial Resource Strain: Not on file  Food Insecurity: Not on file  Transportation Needs: Not on file  Physical Activity: Not on file  Stress: Not on file  Social Connections: Not on file   Family History  Problem Relation Age of Onset  . Heart disease Neg Hx    - negative except otherwise stated in the family history section Allergies  Allergen Reactions  . Keflex [Cephalexin] Hives, Rash and  Other (See Comments)   Prior to Admission medications   Medication Sig Start Date End Date Taking? Authorizing Provider  gabapentin (NEURONTIN) 100 MG capsule Take 300 mg by mouth at bedtime.   Yes [provider]  guaiFENesin (MUCINEX) 600 MG 12 hr tablet Take 600 mg by mouth 2 (two) times daily.   Yes [provider]  lisinopril-hydrochlorothiazide (PRINZIDE,ZESTORETIC) 20-25 MG per tablet Take 1 tablet by mouth 2 (two) times daily.    Yes [provider]  metoprolol tartrate (LOPRESSOR) 25 MG tablet Take 25 mg by mouth daily. 05/06/20  Yes [provider]  tamsulosin (FLOMAX) 0.4 MG CAPS capsule Take 1 capsule (0.4 mg total) by mouth daily. 08/21/14  Yes Otter, Olga, MD  zolpidem (AMBIEN) 10 MG tablet Take 10 mg by mouth at bedtime. 04/21/20  Yes [provider]  albuterol (PROVENTIL HFA;VENTOLIN HFA) 108 (90 BASE) MCG/ACT inhaler Inhale 2 puffs into the lungs 4 (four) times daily. 07/28/13   Keller, David C, MD  docusate sodium (COLACE) 100 MG capsule Take 1 capsule (100 mg total) by mouth every 12 (twelve) hours. 08/21/14   Otter, Olga, MD   DG Chest 2 View  Result Date: 05/07/2020 CLINICAL DATA:  Right foot pain, drainage, sepsis EXAM: CHEST - 2 VIEW COMPARISON:    03/14/2016 FINDINGS: Frontal and lateral views of the chest demonstrate stable dual lead pacer. Cardiac silhouette is unremarkable. No airspace disease, effusion, or pneumothorax. Stable degenerative changes right shoulder. No acute bony abnormalities. IMPRESSION: 1. No acute intrathoracic process. Electronically Signed   By: Sharlet Salina M.D.   On: 05/07/2020 23:37   DG Foot Complete Right  Result Date: 05/08/2020 CLINICAL DATA:  Infection EXAM: RIGHT FOOT COMPLETE - 3+ VIEW COMPARISON:  None. FINDINGS: There is no evidence of fracture or dislocation. 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 There is calcaneal enthesopathy. Focal area of ulceration on the mid plantar surface  with overlying subcutaneous edema. IMPRESSION: Area of ulceration on the plantar surface with overlying soft tissue swelling. No definite evidence of acute osteomyelitis. Electronically Signed   By: Jonna Clark M.D.   On: 05/08/2020 01:40   - pertinent xrays, CT, MRI studies were reviewed and independently interpreted  Positive ROS: All other systems have been reviewed and were otherwise negative with the exception of those mentioned in the HPI and as above.  Physical Exam: General: Alert, no acute distress Psychiatric: Patient is competent for consent with normal mood and affect Lymphatic: No axillary or cervical lymphadenopathy Cardiovascular: No pedal edema Respiratory: No cyanosis, no use of accessory musculature GI: No organomegaly, abdomen is soft and non-tender    Images:  @ENCIMAGES @  Labs:  Lab Results  Component Value Date   HGBA1C 6.1 (H) 05/08/2020   CRP 6.4 (H) 05/08/2020   REPTSTATUS 03/19/2016 FINAL 03/14/2016   CULT  03/14/2016    NO GROWTH 5 DAYS Performed at [facility], [address] [place], [place]     Lab Results  Component Value Date   ALBUMIN 2.3 (L) 05/08/2020   ALBUMIN 2.5 (L) 05/07/2020   ALBUMIN 3.5 03/14/2016    Neurologic: Patient does not have protective sensation bilateral lower extremities.   MUSCULOSKELETAL:   Skin: Examination patient has a necrotic ulcer 1 cm diameter on the plantar aspect of the right foot just distal to the heel pad.  There is purulent drainage.  Patient has a strong dorsalis pedis and posterior tibial pulse.  Hemoglobin A1c is 6.1 albumin 2.3.  Review of the radiographs shows no lytic bony changes.  CRP 6.2.  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  Plan: Plan: We will plan for urgent surgical intervention for excision of the abscess soft tissue sent for cultures.  Will hold on a CT scan at this time.  Risks and benefits were  discussed including persistent infection potential for limb loss.  Thank you for the consult and the opportunity to see [name], MD [facility] [phone] 10:59 AM

## 2020-05-08 NOTE — H&P (View-Only) (Signed)
ORTHOPAEDIC CONSULTATION  REQUESTING PHYSICIAN: Dylan Harrington, Dylan J, DO  Chief Complaint: Purulent draining abscess plantar aspect right foot.  HPI: Dylan Harrington is a 80 y.o. male who presents with purulent draining abscess plantar aspect right foot.  Patient states he stepped on something in his kitchen about 10 days ago and has been having increasing pain odor and drainage.  Patient denies a history of diabetes he states he is a smoker history of COPD pacemaker placement and sleep apnea.  Past Medical History:  Diagnosis Date  . COPD (chronic obstructive pulmonary disease) (HCC)   . Glaucoma   . Hypertension   . Obstructive sleep apnea 03/15/2016  . Pacemaker   . Renal disorder   . Sleep apnea    Past Surgical History:  Procedure Laterality Date  . KIDNEY STONE SURGERY    . MEDIAL PARTIAL KNEE REPLACEMENT    . PACEMAKER INSERTION    . SHOULDER SURGERY    . SPINE SURGERY    . stents     Social History   Socioeconomic History  . Marital status: Married    Spouse name: Not on file  . Number of children: Not on file  . Years of education: Not on file  . Highest education level: Not on file  Occupational History  . Not on file  Tobacco Use  . Smoking status: Heavy Tobacco Smoker    Packs/day: 0.00    Types: Cigarettes  . Smokeless tobacco: Never Used  Substance and Sexual Activity  . Alcohol use: Yes  . Drug use: No  . Sexual activity: Not on file  Other Topics Concern  . Not on file  Social History Narrative  . Not on file   Social Determinants of Health   Financial Resource Strain: Not on file  Food Insecurity: Not on file  Transportation Needs: Not on file  Physical Activity: Not on file  Stress: Not on file  Social Connections: Not on file   Family History  Problem Relation Age of Onset  . Heart disease Neg Hx    - negative except otherwise stated in the family history section Allergies  Allergen Reactions  . Keflex [Cephalexin] Hives, Rash and  Other (See Comments)   Prior to Admission medications   Medication Sig Start Date End Date Taking? Authorizing Provider  gabapentin (NEURONTIN) 100 MG capsule Take 300 mg by mouth at bedtime.   Yes [provider]  guaiFENesin (MUCINEX) 600 MG 12 hr tablet Take 600 mg by mouth 2 (two) times daily.   Yes [provider]  lisinopril-hydrochlorothiazide (PRINZIDE,ZESTORETIC) 20-25 MG per tablet Take 1 tablet by mouth 2 (two) times daily.    Yes [provider]  metoprolol tartrate (LOPRESSOR) 25 MG tablet Take 25 mg by mouth daily. 05/06/20  Yes [provider]  tamsulosin (FLOMAX) 0.4 MG CAPS capsule Take 1 capsule (0.4 mg total) by mouth daily. 08/21/14  Yes Marisa Severin, MD  zolpidem (AMBIEN) 10 MG tablet Take 10 mg by mouth at bedtime. 04/21/20  Yes [provider]  albuterol (PROVENTIL HFA;VENTOLIN HFA) 108 (90 BASE) MCG/ACT inhaler Inhale 2 puffs into the lungs 4 (four) times daily. 07/28/13   [name], MD  docusate sodium (COLACE) 100 MG capsule Take 1 capsule (100 mg total) by mouth every 12 (twelve) hours. 08/21/14   Marisa Severin, MD   DG Chest 2 View  Result Date: 05/07/2020 CLINICAL DATA:  Right foot pain, drainage, sepsis EXAM: CHEST - 2 VIEW COMPARISON:  03/14/2016 FINDINGS: Frontal and lateral views of the chest demonstrate stable dual lead pacer. Cardiac silhouette is unremarkable. No airspace disease, effusion, or pneumothorax. Stable degenerative changes right shoulder. No acute bony abnormalities. IMPRESSION: 1. No acute intrathoracic process. Electronically Signed   By: Sharlet Salina M.D.   On: 05/07/2020 23:37   DG Foot Complete Right  Result Date: 05/08/2020 CLINICAL DATA:  Infection EXAM: RIGHT FOOT COMPLETE - 3+ VIEW COMPARISON:  None. FINDINGS: There is no evidence of fracture or dislocation. 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 There is calcaneal enthesopathy. Focal area of ulceration on the mid plantar surface  with overlying subcutaneous edema. IMPRESSION: Area of ulceration on the plantar surface with overlying soft tissue swelling. No definite evidence of acute osteomyelitis. Electronically Signed   By: Jonna Clark M.D.   On: 05/08/2020 01:40   - pertinent xrays, CT, MRI studies were reviewed and independently interpreted  Positive ROS: All other systems have been reviewed and were otherwise negative with the exception of those mentioned in the HPI and as above.  Physical Exam: General: Alert, no acute distress Psychiatric: Patient is competent for consent with normal mood and affect Lymphatic: No axillary or cervical lymphadenopathy Cardiovascular: No pedal edema Respiratory: No cyanosis, no use of accessory musculature GI: No organomegaly, abdomen is soft and non-tender    Images:  @ENCIMAGES @  Labs:  Lab Results  Component Value Date   HGBA1C 6.1 (H) 05/08/2020   CRP 6.4 (H) 05/08/2020   REPTSTATUS 03/19/2016 FINAL 03/14/2016   CULT  03/14/2016    NO GROWTH 5 DAYS Performed at [facility], [address] [place], [place]     Lab Results  Component Value Date   ALBUMIN 2.3 (L) 05/08/2020   ALBUMIN 2.5 (L) 05/07/2020   ALBUMIN 3.5 03/14/2016    Neurologic: Patient does not have protective sensation bilateral lower extremities.   MUSCULOSKELETAL:   Skin: Examination patient has a necrotic ulcer 1 cm diameter on the plantar aspect of the right foot just distal to the heel pad.  There is purulent drainage.  Patient has a strong dorsalis pedis and posterior tibial pulse.  Hemoglobin A1c is 6.1 albumin 2.3.  Review of the radiographs shows no lytic bony changes.  CRP 6.2.  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  Plan: Plan: We will plan for urgent surgical intervention for excision of the abscess soft tissue sent for cultures.  Will hold on a CT scan at this time.  Risks and benefits were  discussed including persistent infection potential for limb loss.  Thank you for the consult and the opportunity to see Mr. Dylan Elison, MD [facility] [phone] 10:59 AM

## 2020-05-08 NOTE — Progress Notes (Signed)
Spoke with patients daughter Synetta Fail) tonight . Family informed of procedure and time.

## 2020-05-08 NOTE — Progress Notes (Signed)
Pt arrived back to the unit. Pt not in distress and tolerated well. Educated on NPO after midnight.

## 2020-05-08 NOTE — Progress Notes (Signed)
Patient ID: Dylan Harrington, male   DOB: 1940/02/19, 80 y.o.   MRN: [id number] Surgery postponed until tomorrow morning at 730 due to n.p.o. status.

## 2020-05-08 NOTE — Progress Notes (Signed)
PROGRESS NOTE    Dylan Harrington  JKK:[id number] DOB: 19-Dec-1940 DOA: 05/07/2020 PCP: Winifred Olive, MD    Brief Narrative:  Dylan Harrington is a 80 year old male with past medical history significant for COPD, BPH, essential hypertension, OSA, continued tobacco use disorder who presented to the ED with worsening right foot pain.  Patient reports that he stepped on something sharp at home roughly 10 days ago; but he cannot recall what this item was.  This initially resulted in a small wound at the bottom of his foot; and over the next 40 hours he developed severe pain with associated redness and swelling.  He then went to his PCP who prescribed him Bactrim which she reported compliance with and completed a 7-day course; although he continued with worsening intensity of pain/swelling and now with purulent discharge.  In the ED, temperature 98.2 F, HR 80, RR 16, BP 159/77, SPO2 98% on room air.  Sodium 133, potassium 2.9, chloride 97, CO2 26, glucose 113, BUN 23, creatinine 1.42.  Lactic acid 2.3, WBC 11.9, hemoglobin 13.4, platelets 294.  Straight with no acute cardiopulmonary disease process.  Right foot x-ray with area of ulceration plantar surface with overlying soft tissue swelling no definite evidence of acute osteomyelitis.  Blood cultures x2 were drawn.  Patient was started on Parikh antibiotics with vancomycin and meropenem.  Hospital service consulted for further evaluation management.   Assessment & Plan:   Principal Problem:   Cellulitis of right foot Active Problems:   COPD (chronic obstructive pulmonary disease) (HCC)   Essential hypertension   Skin ulcer of plantar aspect of foot (HCC)   Nicotine dependence, cigarettes, uncomplicated   BPH without urinary obstruction   Lactic acidosis   Hypokalemia   Cutaneous abscess of right foot   Protein-calorie malnutrition, severe   Right foot abscess Patient presenting to the ED with a 10-day history of worsening swelling,  erythema, pain and now purulent discharge to right foot wound.  Sustained injury roughly 10 days ago.  Completed 7-day course of Bactrim without improvement.  Patient is afebrile but with elevated WBC count of 11.2 and elevated CRP of 6.4.  No definite bony involvement on foot x-ray. --Orthopedics following, Dr. Lajoyce Corners; appreciate assistance --Blood cultures x2: Pending --Continue empiric antibiotics with vancomycin/meropenem --Plan urgent surgical intervention today for excision of abscess --Pain control with oxycodone/morphine --Follow CBC daily  Acute renal failure Unknown baseline.  Creatinine elevated 1.42 on admission, likely prerenal in the setting of infection as above. --Cr 1.42>1.25 --Hold HCTZ/lisinopril --IVF w/ LR at 125mL/hr --Avoid nephrotoxins, renally dose all medications --Follow BMP daily  Essential hypertension Home medication regimen includes hydrochlorothiazide, lisinopril, metoprolol tartrate 25 mg p.o. daily --Olding HCTZ/lisinopril due to renal insufficiency as above --Continue metoprolol tartrate 25 mg p.o. daily --Hydralazine 25mg  PO q8h prn for SBP >160 or DBP >110  BPH: 0.4 mg p.o. daily  COPD Continued tobacco use disorder Patient not oxygen dependent at baseline, but endorses continued tobacco abuse with 1 pack/day.  Counseled on need for complete cessation given wound and need for adequate wound healing. --Declines nicotine patch --Albuterol MDI as needed  Peripheral neuropathy: Gabapentin 300 mg p.o. nightly   DVT prophylaxis: Lovenox   Code Status: Full Code Family Communication: Son present at bedside this morning  Disposition Plan:  Level of care: Telemetry Medical Status is: Observation  The patient remains OBS appropriate and will d/c before 2 midnights.  Dispo: The patient is from: Home  Anticipated d/c is to: Home              Patient currently is not medically stable to d/c.   Difficult to place patient  No  Consultants:   Orthopedics, Dr. Lajoyce Corners  Procedures:   None  Antimicrobials:   Vancomycin 3/25>>  Meropenem 3/25>>    Subjective: Patient seen and examined bedside, resting comfortably.  Son present.  Continues with pain, swelling with purulent discharge to right foot.  Started on antibiotics, pending surgical intervention for right foot abscess today per Dr. Lajoyce Corners.  No other questions or concerns at this time.  Patient and son appreciative all the care they have received so far in the hospitalization.  Patient denies headache, no fever/chills/night sweats, no nausea/vomiting/diarrhea, no chest pain, palpitations, no shortness of breath, no abdominal pain, no weakness, no cough/congestion.  No acute events overnight per nursing staff.  Objective: Vitals:   05/08/20 0300 05/08/20 0317 05/08/20 0353 05/08/20 0756  BP: (!) 151/81  135/73 136/78  Pulse: 93  76 87  Resp: 20   16  Temp:  98.6 F (37 C) 98.9 F (37.2 C) 98.6 F (37 C)  TempSrc:  Oral Oral Oral  SpO2: 100%  97% 95%  Weight:      Height:       No intake or output data in the 24 hours ending 05/08/20 1230 Filed Weights   05/07/20 2308  Weight: 94.3 kg    Examination:  General exam: Appears calm and comfortable  Respiratory system: Clear to auscultation. Respiratory effort normal.  On room air Cardiovascular system: S1 & S2 heard, RRR. No JVD, murmurs, rubs, gallops or clicks. No pedal edema. Gastrointestinal system: Abdomen is nondistended, soft and nontender. No organomegaly or masses felt. Normal bowel sounds heard. Central nervous system: Alert and oriented. No focal neurological deficits. Extremities: Wound noted plantar surface right foot, foul-smelling with purulent discharge and decreased sensation to plantar surface with associated edema and erythema Skin: Right foot wound as depicted below with purulent discharge/erythema/edema Psychiatry: Judgement and insight appear normal. Mood & affect  appropriate.          Data Reviewed: I have personally reviewed following labs and imaging studies  CBC: Recent Labs  Lab 05/07/20 2333 05/08/20 0447  WBC 11.9* 9.6  NEUTROABS 8.6* 6.9  HGB 13.4 13.4  HCT 41.7 40.2  MCV 98.8 96.6  PLT 294 278   Basic Metabolic Panel: Recent Labs  Lab 05/07/20 2333 05/08/20 0447  NA 133* 135  K 2.9* 3.0*  CL 97* 98  CO2 26 27  GLUCOSE 113* 102*  BUN 23 21  CREATININE 1.42* 1.25*  CALCIUM 8.5* 8.4*  MG  --  1.8   GFR: Estimated Creatinine Clearance: 57.1 mL/min (A) (by C-G formula based on SCr of 1.25 mg/dL (H)). Liver Function Tests: Recent Labs  Lab 05/07/20 2333 05/08/20 0447  AST 18 17  ALT 12 12  ALKPHOS 60 49  BILITOT 0.7 0.9  PROT 8.3* 7.8  ALBUMIN 2.5* 2.3*   No results for input(s): LIPASE, AMYLASE in the last 168 hours. No results for input(s): AMMONIA in the last 168 hours. Coagulation Profile: Recent Labs  Lab 05/07/20 2333 05/08/20 0447  INR 1.1 1.1   Cardiac Enzymes: No results for input(s): CKTOTAL, CKMB, CKMBINDEX, TROPONINI in the last 168 hours. BNP (last 3 results) No results for input(s): PROBNP in the last 8760 hours. HbA1C: Recent Labs    05/08/20 0447  HGBA1C 6.1*   CBG: No  results for input(s): GLUCAP in the last 168 hours. Lipid Profile: No results for input(s): CHOL, HDL, LDLCALC, TRIG, CHOLHDL, LDLDIRECT in the last 72 hours. Thyroid Function Tests: No results for input(s): TSH, T4TOTAL, FREET4, T3FREE, THYROIDAB in the last 72 hours. Anemia Panel: No results for input(s): VITAMINB12, FOLATE, FERRITIN, TIBC, IRON, RETICCTPCT in the last 72 hours. Sepsis Labs: Recent Labs  Lab 05/07/20 2333 05/08/20 0140  LATICACIDVEN 2.3* 1.7    Recent Results (from the past 240 hour(s))  Resp Panel by RT-PCR (Flu A&B, Covid) Nasopharyngeal Swab     Status: None   Collection Time: 05/08/20  1:38 AM   Specimen: Nasopharyngeal Swab; Nasopharyngeal(NP) swabs in vial transport medium   Result Value Ref Range Status   SARS Coronavirus 2 by RT PCR NEGATIVE NEGATIVE Final    Comment: (NOTE) SARS-CoV-2 target nucleic acids are NOT DETECTED.  The SARS-CoV-2 RNA is generally detectable in upper respiratory specimens during the acute phase of infection. The lowest concentration of SARS-CoV-2 viral copies this assay can detect is 138 copies/mL. A negative result does not preclude SARS-Cov-2 infection and should not be used as the sole basis for treatment or other patient management decisions. A negative result may occur with  improper specimen collection/handling, submission of specimen other than nasopharyngeal swab, presence of viral mutation(s) within the areas targeted by this assay, and inadequate number of viral copies(<138 copies/mL). A negative result must be combined with clinical observations, patient history, and epidemiological information. The expected result is Negative.  Fact Sheet for Patients:  BloggerCourse.comhttps://www.fda.gov/media/152166/download  Fact Sheet for Healthcare Providers:  SeriousBroker.ithttps://www.fda.gov/media/152162/download  This test is no t yet approved or cleared by the [place] FDA and  has been authorized for detection and/or diagnosis of SARS-CoV-2 by FDA under an Emergency Use Authorization (EUA). This EUA will remain  in effect (meaning this test can be used) for the duration of the COVID-19 declaration under Section 564(b)(1) of the Act, 21 U.S.C.section 360bbb-3(b)(1), unless the authorization is terminated  or revoked sooner.       Influenza A by PCR NEGATIVE NEGATIVE Final   Influenza B by PCR NEGATIVE NEGATIVE Final    Comment: (NOTE) The Xpert Xpress SARS-CoV-2/FLU/RSV plus assay is intended as an aid in the diagnosis of influenza from Nasopharyngeal swab specimens and should not be used as a sole basis for treatment. Nasal washings and aspirates are unacceptable for Xpert Xpress SARS-CoV-2/FLU/RSV testing.  Fact Sheet for  Patients: BloggerCourse.comhttps://www.fda.gov/media/152166/download  Fact Sheet for Healthcare Providers: SeriousBroker.ithttps://www.fda.gov/media/152162/download  This test is not yet approved or cleared by the [place] FDA and has been authorized for detection and/or diagnosis of SARS-CoV-2 by FDA under an Emergency Use Authorization (EUA). This EUA will remain in effect (meaning this test can be used) for the duration of the COVID-19 declaration under Section 564(b)(1) of the Act, 21 U.S.C. section 360bbb-3(b)(1), unless the authorization is terminated or revoked.  Performed at [facility], [address] [place], [place] [address]          Radiology Studies: DG Chest 2 View  Result Date: 05/07/2020 CLINICAL DATA:  Right foot pain, drainage, sepsis EXAM: CHEST - 2 VIEW COMPARISON:  03/14/2016 FINDINGS: Frontal and lateral views of the chest demonstrate stable dual lead pacer. Cardiac silhouette is unremarkable. No airspace disease, effusion, or pneumothorax. Stable degenerative changes right shoulder. No acute bony abnormalities. IMPRESSION: 1. No acute intrathoracic process. Electronically Signed   By: Sharlet SalinaMichael  Brown M.D.   On: 05/07/2020 23:37   DG  Foot Complete Right  Result Date: 05/08/2020 CLINICAL DATA:  Infection EXAM: RIGHT FOOT COMPLETE - 3+ VIEW COMPARISON:  None. FINDINGS: There is no evidence of fracture or dislocation. 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 There is calcaneal enthesopathy. Focal area of ulceration on the mid plantar surface with overlying subcutaneous edema. IMPRESSION: Area of ulceration on the plantar surface with overlying soft tissue swelling. No definite evidence of acute osteomyelitis. Electronically Signed   By: Jonna Clark M.D.   On: 05/08/2020 01:40        Scheduled Meds: . (feeding supplement) PROSource Plus  30 mL Oral BID BM  . enoxaparin (LOVENOX) injection  40 mg Subcutaneous Q24H  . feeding supplement  237 mL Oral TID BM  .  gabapentin  300 mg Oral QHS  . guaiFENesin  600 mg Oral BID  . metoprolol tartrate  25 mg Oral Daily  . multivitamin with minerals  1 tablet Oral Daily  . tamsulosin  0.4 mg Oral Daily   Continuous Infusions: . lactated ringers 125 mL/hr at 05/08/20 0609  . meropenem (MERREM) IV 1 g (05/08/20 0510)  . vancomycin 750 mg (05/08/20 1155)     LOS: 0 days    Time spent: 39 minutes spent on chart review, discussion with nursing staff, consultants, updating family and interview/physical exam; more than 50% of that time was spent in counseling and/or coordination of care.    Alvira Philips [place], DO [facility] Available via Epic secure chat 7am-7pm After these hours, please refer to coverage provider listed on [email or web address] 05/08/2020, 12:30 PM

## 2020-05-09 ENCOUNTER — Inpatient Hospital Stay (HOSPITAL_COMMUNITY): Payer: Medicare HMO | Admitting: Anesthesiology

## 2020-05-09 ENCOUNTER — Encounter (HOSPITAL_COMMUNITY): Payer: Self-pay | Admitting: Internal Medicine

## 2020-05-09 ENCOUNTER — Encounter (HOSPITAL_COMMUNITY)
Admission: EM | Disposition: A | Discharge status: Skilled Nursing Facility | Payer: Self-pay | Source: Home / Self Care | Attending: Internal Medicine

## 2020-05-09 DIAGNOSIS — J449 Chronic obstructive pulmonary disease, unspecified: Secondary | ICD-10-CM | POA: Diagnosis not present

## 2020-05-09 DIAGNOSIS — L02611 Cutaneous abscess of right foot: Secondary | ICD-10-CM | POA: Diagnosis not present

## 2020-05-09 DIAGNOSIS — A419 Sepsis, unspecified organism: Secondary | ICD-10-CM

## 2020-05-09 DIAGNOSIS — L03115 Cellulitis of right lower limb: Secondary | ICD-10-CM | POA: Diagnosis not present

## 2020-05-09 DIAGNOSIS — E43 Unspecified severe protein-calorie malnutrition: Secondary | ICD-10-CM | POA: Diagnosis not present

## 2020-05-09 DIAGNOSIS — L97513 Non-pressure chronic ulcer of other part of right foot with necrosis of muscle: Secondary | ICD-10-CM | POA: Diagnosis not present

## 2020-05-09 DIAGNOSIS — N179 Acute kidney failure, unspecified: Secondary | ICD-10-CM

## 2020-05-09 DIAGNOSIS — I10 Essential (primary) hypertension: Secondary | ICD-10-CM | POA: Diagnosis not present

## 2020-05-09 DIAGNOSIS — R6520 Severe sepsis without septic shock: Secondary | ICD-10-CM

## 2020-05-09 HISTORY — PX: I & D EXTREMITY: SHX5045

## 2020-05-09 LAB — CBC
HCT: 37.5 % — ABNORMAL LOW (ref 39.0–52.0)
Hemoglobin: 12.6 g/dL — ABNORMAL LOW (ref 13.0–17.0)
MCH: 32.3 pg (ref 26.0–34.0)
MCHC: 33.6 g/dL (ref 30.0–36.0)
MCV: 96.2 fL (ref 80.0–100.0)
Platelets: 275 10*3/uL (ref 150–400)
RBC: 3.9 MIL/uL — ABNORMAL LOW (ref 4.22–5.81)
RDW: 14.1 % (ref 11.5–15.5)
WBC: 9.8 10*3/uL (ref 4.0–10.5)
nRBC: 0 % (ref 0.0–0.2)

## 2020-05-09 LAB — BASIC METABOLIC PANEL
Anion gap: 8 (ref 5–15)
BUN: 16 mg/dL (ref 8–23)
CO2: 27 mmol/L (ref 22–32)
Calcium: 8.5 mg/dL — ABNORMAL LOW (ref 8.9–10.3)
Chloride: 100 mmol/L (ref 98–111)
Creatinine, Ser: 1.1 mg/dL (ref 0.61–1.24)
GFR, Estimated: >60 mL/min (ref >60)
Glucose, Bld: 96 mg/dL (ref 70–99)
Potassium: 4.3 mmol/L (ref 3.5–5.1)
Sodium: 135 mmol/L (ref 135–145)

## 2020-05-09 LAB — MRSA PCR SCREENING: MRSA by PCR: POSITIVE — AB

## 2020-05-09 LAB — MAGNESIUM: Magnesium: 1.5 mg/dL — ABNORMAL LOW (ref 1.7–2.4)

## 2020-05-09 SURGERY — IRRIGATION AND DEBRIDEMENT EXTREMITY
Anesthesia: General | Site: Foot | Laterality: Right

## 2020-05-09 MED ORDER — FENTANYL CITRATE (PF) 100 MCG/2ML IJ SOLN
INTRAMUSCULAR | Status: AC
  Filled 2020-05-09: qty 2

## 2020-05-09 MED ORDER — OXYCODONE HCL 5 MG PO TABS
5.0000 mg | ORAL_TABLET | ORAL | Status: DC | PRN
  Administered 2020-05-09 – 2020-05-10 (×2): 5 mg via ORAL
  Filled 2020-05-09: qty 2
  Filled 2020-05-09: qty 1

## 2020-05-09 MED ORDER — PHENYLEPHRINE HCL (PRESSORS) 10 MG/ML IV SOLN
INTRAVENOUS | Status: DC | PRN
  Administered 2020-05-09: 80 ug via INTRAVENOUS

## 2020-05-09 MED ORDER — FENTANYL CITRATE (PF) 100 MCG/2ML IJ SOLN
INTRAMUSCULAR | Status: DC | PRN
  Administered 2020-05-09 (×2): 25 ug via INTRAVENOUS

## 2020-05-09 MED ORDER — ONDANSETRON HCL 4 MG/2ML IJ SOLN
INTRAMUSCULAR | Status: DC | PRN
  Administered 2020-05-09: 4 mg via INTRAVENOUS

## 2020-05-09 MED ORDER — METHOCARBAMOL 500 MG PO TABS
ORAL_TABLET | ORAL | Status: AC
  Filled 2020-05-09: qty 1

## 2020-05-09 MED ORDER — METHOCARBAMOL 1000 MG/10ML IJ SOLN
500.0000 mg | Freq: Four times a day (QID) | INTRAVENOUS | Status: DC | PRN
  Filled 2020-05-09: qty 5

## 2020-05-09 MED ORDER — OXYCODONE HCL 5 MG PO TABS
10.0000 mg | ORAL_TABLET | ORAL | Status: DC | PRN
  Administered 2020-05-10: 10 mg via ORAL
  Administered 2020-05-11: 15 mg via ORAL
  Filled 2020-05-09: qty 2
  Filled 2020-05-09: qty 3

## 2020-05-09 MED ORDER — ONDANSETRON HCL 4 MG/2ML IJ SOLN: 4.0000 mg | Freq: Four times a day (QID) | INTRAMUSCULAR | Status: DC | PRN

## 2020-05-09 MED ORDER — DOCUSATE SODIUM 100 MG PO CAPS
100.0000 mg | ORAL_CAPSULE | Freq: Two times a day (BID) | ORAL | Status: DC
  Administered 2020-05-09 – 2020-05-19 (×16): 100 mg via ORAL
  Filled 2020-05-09 (×20): qty 1

## 2020-05-09 MED ORDER — MAGNESIUM SULFATE 2 GM/50ML IV SOLN
2.0000 g | Freq: Once | INTRAVENOUS | Status: AC
  Administered 2020-05-09: 2 g via INTRAVENOUS
  Filled 2020-05-09 (×2): qty 50

## 2020-05-09 MED ORDER — MAGNESIUM CITRATE PO SOLN: 1.0000 | Freq: Once | ORAL | Status: DC | PRN

## 2020-05-09 MED ORDER — CHLORHEXIDINE GLUCONATE CLOTH 2 % EX PADS
6.0000 | MEDICATED_PAD | Freq: Every day | CUTANEOUS | Status: AC
  Administered 2020-05-10 – 2020-05-13 (×4): 6 via TOPICAL

## 2020-05-09 MED ORDER — BISACODYL 10 MG RE SUPP: 10.0000 mg | Freq: Every day | RECTAL | Status: DC | PRN

## 2020-05-09 MED ORDER — FENTANYL CITRATE (PF) 100 MCG/2ML IJ SOLN
25.0000 ug | INTRAMUSCULAR | Status: DC | PRN
Start: 2020-05-09 — End: 2020-05-09
  Administered 2020-05-09 (×3): 25 ug via INTRAVENOUS

## 2020-05-09 MED ORDER — LIDOCAINE 2% (20 MG/ML) 5 ML SYRINGE
INTRAMUSCULAR | Status: DC | PRN
  Administered 2020-05-09: 40 mg via INTRAVENOUS

## 2020-05-09 MED ORDER — ONDANSETRON HCL 4 MG PO TABS: 4.0000 mg | ORAL_TABLET | Freq: Four times a day (QID) | ORAL | Status: DC | PRN

## 2020-05-09 MED ORDER — ACETAMINOPHEN 325 MG PO TABS: 325.0000 mg | ORAL_TABLET | Freq: Four times a day (QID) | ORAL | Status: DC | PRN

## 2020-05-09 MED ORDER — POVIDONE-IODINE 10 % EX SWAB: 2.0000 "application " | Freq: Once | CUTANEOUS | Status: DC

## 2020-05-09 MED ORDER — METOCLOPRAMIDE HCL 5 MG/ML IJ SOLN: 5.0000 mg | Freq: Three times a day (TID) | INTRAMUSCULAR | Status: DC | PRN

## 2020-05-09 MED ORDER — 0.9 % SODIUM CHLORIDE (POUR BTL) OPTIME
TOPICAL | Status: DC | PRN
  Administered 2020-05-09: 1000 mL

## 2020-05-09 MED ORDER — OXYCODONE HCL 5 MG PO TABS
ORAL_TABLET | ORAL | Status: AC
  Filled 2020-05-09: qty 1

## 2020-05-09 MED ORDER — PROPOFOL 10 MG/ML IV BOLUS
INTRAVENOUS | Status: DC | PRN
  Administered 2020-05-09: 160 mg via INTRAVENOUS

## 2020-05-09 MED ORDER — HYDROMORPHONE HCL 1 MG/ML IJ SOLN
0.5000 mg | INTRAMUSCULAR | Status: DC | PRN
  Administered 2020-05-09: 1 mg via INTRAVENOUS
  Filled 2020-05-09: qty 1

## 2020-05-09 MED ORDER — LACTATED RINGERS IV SOLN: INTRAVENOUS | Status: DC

## 2020-05-09 MED ORDER — METHOCARBAMOL 500 MG PO TABS
500.0000 mg | ORAL_TABLET | Freq: Four times a day (QID) | ORAL | Status: DC | PRN
  Administered 2020-05-09 – 2020-05-13 (×3): 500 mg via ORAL
  Filled 2020-05-09 (×2): qty 1

## 2020-05-09 MED ORDER — MUPIROCIN 2 % EX OINT
1.0000 "application " | TOPICAL_OINTMENT | Freq: Two times a day (BID) | CUTANEOUS | Status: AC
  Administered 2020-05-09 – 2020-05-13 (×10): 1 via NASAL
  Filled 2020-05-09: qty 22

## 2020-05-09 MED ORDER — PROPOFOL 10 MG/ML IV BOLUS
INTRAVENOUS | Status: AC
  Filled 2020-05-09: qty 20

## 2020-05-09 MED ORDER — AMISULPRIDE (ANTIEMETIC) 5 MG/2ML IV SOLN: 10.0000 mg | Freq: Once | INTRAVENOUS | Status: DC | PRN

## 2020-05-09 MED ORDER — SODIUM CHLORIDE 0.9 % IV SOLN: INTRAVENOUS | Status: DC

## 2020-05-09 MED ORDER — CHLORHEXIDINE GLUCONATE 4 % EX LIQD
60.0000 mL | Freq: Once | CUTANEOUS | Status: DC
  Filled 2020-05-09: qty 60

## 2020-05-09 MED ORDER — POLYETHYLENE GLYCOL 3350 17 G PO PACK: 17.0000 g | PACK | Freq: Every day | ORAL | Status: DC | PRN

## 2020-05-09 MED ORDER — METOCLOPRAMIDE HCL 5 MG PO TABS: 5.0000 mg | ORAL_TABLET | Freq: Three times a day (TID) | ORAL | Status: DC | PRN

## 2020-05-09 MED ORDER — FENTANYL CITRATE (PF) 250 MCG/5ML IJ SOLN
INTRAMUSCULAR | Status: AC
  Filled 2020-05-09: qty 5

## 2020-05-09 SURGICAL SUPPLY — 34 items
BLADE SURG 21 STRL SS (BLADE) ×2 IMPLANT
BNDG COHESIVE 4X5 TAN STRL (GAUZE/BANDAGES/DRESSINGS) ×1 IMPLANT
BNDG COHESIVE 6X5 TAN STRL LF (GAUZE/BANDAGES/DRESSINGS) IMPLANT
BNDG GAUZE ELAST 4 BULKY (GAUZE/BANDAGES/DRESSINGS) ×4 IMPLANT
CANISTER WOUND CARE 500ML ATS (WOUND CARE) ×1 IMPLANT
COVER SURGICAL LIGHT HANDLE (MISCELLANEOUS) ×4 IMPLANT
COVER WAND RF STERILE (DRAPES) IMPLANT
DRAPE U-SHAPE 47X51 STRL (DRAPES) ×2 IMPLANT
DRSG ADAPTIC 3X8 NADH LF (GAUZE/BANDAGES/DRESSINGS) ×2 IMPLANT
DURAPREP 26ML APPLICATOR (WOUND CARE) ×2 IMPLANT
ELECT REM PT RETURN 9FT ADLT (ELECTROSURGICAL)
ELECTRODE REM PT RTRN 9FT ADLT (ELECTROSURGICAL) IMPLANT
GAUZE SPONGE 4X4 12PLY STRL (GAUZE/BANDAGES/DRESSINGS) ×2 IMPLANT
GLOVE BIOGEL PI IND STRL 9 (GLOVE) ×1 IMPLANT
GLOVE BIOGEL PI INDICATOR 9 (GLOVE) ×1
GLOVE SURG ORTHO 9.0 STRL STRW (GLOVE) ×2 IMPLANT
GOWN STRL REUS W/ TWL XL LVL3 (GOWN DISPOSABLE) ×2 IMPLANT
GOWN STRL REUS W/TWL XL LVL3 (GOWN DISPOSABLE) ×4
HANDPIECE INTERPULSE COAX TIP (DISPOSABLE)
KIT BASIN OR (CUSTOM PROCEDURE TRAY) ×2 IMPLANT
KIT PREVENA INCISION MGT 13 (CANNISTER) ×1 IMPLANT
KIT TURNOVER KIT B (KITS) ×2 IMPLANT
MANIFOLD NEPTUNE II (INSTRUMENTS) ×2 IMPLANT
NS IRRIG 1000ML POUR BTL (IV SOLUTION) ×2 IMPLANT
PACK ORTHO EXTREMITY (CUSTOM PROCEDURE TRAY) ×2 IMPLANT
PAD ARMBOARD 7.5X6 YLW CONV (MISCELLANEOUS) ×4 IMPLANT
SET HNDPC FAN SPRY TIP SCT (DISPOSABLE) IMPLANT
STOCKINETTE IMPERVIOUS 9X36 MD (GAUZE/BANDAGES/DRESSINGS) IMPLANT
SUT ETHILON 2 0 PSLX (SUTURE) ×2 IMPLANT
SWAB COLLECTION DEVICE MRSA (MISCELLANEOUS) ×2 IMPLANT
SWAB CULTURE ESWAB REG 1ML (MISCELLANEOUS) IMPLANT
TOWEL GREEN STERILE (TOWEL DISPOSABLE) ×2 IMPLANT
TUBE CONNECTING 12X1/4 (SUCTIONS) ×2 IMPLANT
YANKAUER SUCT BULB TIP NO VENT (SUCTIONS) ×2 IMPLANT

## 2020-05-09 NOTE — Transfer of Care (Signed)
Immediate Anesthesia Transfer of Care Note  Patient: Dylan Harrington  Procedure(s) Performed: IRRIGATION AND DEBRIDEMENT FOOT (Right Foot)  Patient Location: PACU  Anesthesia Type:General  Level of Consciousness: awake, alert  and oriented  Airway & Oxygen Therapy: Patient Spontanous Breathing and Patient connected to face mask oxygen  Post-op Assessment: Report given to RN and Post -op Vital signs reviewed and stable  Post vital signs: Reviewed and stable  Last Vitals:  Vitals Value Taken Time  BP    Temp    Pulse 71 05/09/20 0947  Resp 18 05/09/20 0947  SpO2 97 % 05/09/20 0947  Vitals shown include unvalidated device data.  Last Pain:  Vitals:   05/09/20 0800  TempSrc: Oral  PainSc:          Complications: No complications documented.

## 2020-05-09 NOTE — Anesthesia Procedure Notes (Signed)
Procedure Name: LMA Insertion Date/Time: 05/09/2020 9:00 AM Performed by: Sheppard Evens, CRNA Pre-anesthesia Checklist: Patient identified, Emergency Drugs available, Suction available and Patient being monitored Patient Re-evaluated:Patient Re-evaluated prior to induction Oxygen Delivery Method: Circle System Utilized Preoxygenation: Pre-oxygenation with 100% oxygen Induction Type: IV induction Ventilation: Mask ventilation without difficulty LMA: LMA inserted LMA Size: 4.0 Number of attempts: 1 Airway Equipment and Method: Bite block Placement Confirmation: positive ETCO2 Tube secured with: Tape Dental Injury: Teeth and Oropharynx as per pre-operative assessment

## 2020-05-09 NOTE — Op Note (Signed)
05/09/2020  9:43 AM  PATIENT:  Dylan Harrington    PRE-OPERATIVE DIAGNOSIS:  RIGHT FOOT ABSCESS  POST-OPERATIVE DIAGNOSIS:  Same  PROCEDURE: Excisional debridement right foot. Local tissue rearrangement for wound closure 9 x 4 cm. Application of Prevena wound VAC and Penrose drain.  Tissue sent for cultures.  SURGEON:  Nadara Mustard, MD  PHYSICIAN ASSISTANT:None ANESTHESIA:   General  PREOPERATIVE INDICATIONS:  Dylan Harrington is a  80 y.o. male with a diagnosis of RIGHT FOOT ABSCESS who failed conservative measures and elected for surgical management.    The risks benefits and alternatives were discussed with the patient preoperatively including but not limited to the risks of infection, bleeding, nerve injury, cardiopulmonary complications, the need for revision surgery, among others, and the patient was willing to proceed.  OPERATIVE IMPLANTS: Praveena wound VAC and Penrose drain.  @ENCIMAGES @  OPERATIVE FINDINGS: Patient had a large deep abscess involving the fascia of the muscle and soft tissue.  Tissue was sent for cultures.  Patient will need long-term antibiotics based on the cultures.  OPERATIVE PROCEDURE: Patient was brought the operating room underwent a general anesthetic.  After adequate levels anesthesia were obtained patient's right lower extremity was prepped using DuraPrep draped into a sterile field a timeout was called.  A football shaped ellipse was made around the ulcerative tissue longitudinally on the plantar aspect of the foot.  This left a wound that was 9 x 4 cm.  This extended down there was necrotic purulent drainage there is necrotic soft tissue that was excised with a rondure there was involvement of the plantar fascia which was excised and the infection went down to the muscle partial muscle excision on the plantar aspect of the foot.  The wound was irrigated with normal saline all margins were healthy and viable.  Local tissue rearrangement was used to  close the wound 9 x 4 cm.  A Penrose drain was placed deep in the wound to drain the dead space.  The Prevena wound VAC was applied this had a good suction fit this was overwrapped with Covan patient was extubated taken the PACU in stable condition.  Debridement type: Excisional Debridement  Side: right  Body Location: foot   Tools used for debridement: scalpel and rongeur  Pre-debridement Wound size (cm):   Length: 1        Width: 1     Depth: 0   Post-debridement Wound size (cm):   Length: 9        Width: 4     Depth: 3   Debridement depth beyond dead/damaged tissue down to healthy viable tissue: yes  Tissue layer involved: skin, subcutaneous tissue, muscle / fascia  Nature of tissue removed: Slough, Necrotic, Devitalized Tissue, Non-viable tissue and Purulence  Irrigation volume: 1 liter      Irrigation fluid type: Normal Saline        DISCHARGE PLANNING:  Antibiotic duration: Continue antibiotics and adjust according to cultures.  Weightbearing: Nonweightbearing on the right  Pain medication: Opioid pathway  Dressing care/ Wound VAC: Continue wound VAC for 1 week  Ambulatory devices: Walker  Discharge to: Anticipate discharge to home.  Follow-up: In the office 1 week post operative.

## 2020-05-09 NOTE — Progress Notes (Signed)
PROGRESS NOTE  Dylan Harrington YDX:[id number] DOB: Mar 11, 1940   PCP: Vivia Birmingham, MD  Patient is from: Home  DOA: 05/07/2020 LOS: 1  Chief complaints: Right foot pain  Brief Narrative / Interim history: 80 year old M with PMH of COPD, OSA, HTN, BPH, neuropathy and tobacco use disorder presented to ED with worsening right foot pain.  Apparently stepped on unknown sharp object inside his house about 10 days prior that resulted in a small wound at the bottom of his foot with severe pain, redness and swelling that has gotten worse with purulent drainage despite treatment with p.o. Bactrim for 7 days at PCP office.   He was a started on vancomycin and meropenem.  He underwent excisional debridement and wound VAC placement of right foot by Dr. Sharol Given on 3/26.  Wound cultures pending.  Subjective: Seen and examined earlier this morning after he returned from surgery.  No major events overnight of this morning.  No complaints other than "some" pain in his feet for surgery.  He denies chest pain, dyspnea, GI or UTI symptoms.  He is not sure about tetanus vaccine status.  Objective: Vitals:   05/09/20 1018 05/09/20 1033 05/09/20 1100 05/09/20 1513  BP: 118/65 113/65 119/72 (!) 125/53  Pulse: 64 64 68 71  Resp: _0 Temp:  (!) 97.2 F (36.2 C) 98 F (36.7 C) 98.3 F (36.8 C)  TempSrc:   Oral Oral  SpO2: 94% 93% 95% 93%  Weight:      Height:        Intake/Output Summary (Last 24 hours) at 05/09/2020 1558 Last data filed at 05/09/2020 1500 Gross per 24 hour  Intake 640 ml  Output 1110 ml  Net -470 ml   Filed Weights   05/07/20 2308 05/08/20 1355  Weight: 94.3 kg 94.3 kg    Examination:  GENERAL: No apparent distress.  Nontoxic. HEENT: MMM.  Vision and hearing grossly intact.  NECK: Supple.  No apparent JVD.  RESP: On RA.  No IWOB.  Fair aeration bilaterally. CVS:  RRR. Heart sounds normal.  ABD/GI/GU: BS+. Abd soft, NTND.  MSK/EXT:  Moves extremities. No apparent  deformity. No edema.  Wound VAC over right foot. SKIN: Wound VAC over right foot. NEURO: Awake, alert and oriented appropriately.  No apparent focal neuro deficit. PSYCH: Calm. Normal affect.  Procedures:  3/26-excisional debridement and wound VAC placement of right foot by Dr. Sharol Given  Microbiology summarized: COVID-19 and influenza PCR nonreactive. Superficial wound culture with abundant staph aureus Blood cultures NGTD Surgical culture pending  Assessment & Plan: Severe sepsis due to right foot abscess/cellulitis-following injury after stepping on unknown object at home.  POA.  Met criteria with leukocytosis, tachypnea, AKI and lactic acidosis on presentation.  Failed outpatient treatment with oral Bactrim.  Superficial culture with staph aureus.  Blood cultures NGTD. -S/p excisional debridement on 3/26 -Continue vancomycin and meropenem pending surgical culture -Pain control -Tetanus vaccine.  Patient is unsure about tetanus status  AKI: Resolved. Recent Labs    05/07/20 2333 05/08/20 0447 05/09/20 0301  BUN _1 CREATININE 1.42* 1.25* 1.10  -Continue monitoring -Continue holding lisinopril and HCTZ  Essential hypertension: Normotensive. -Continue holding lisinopril and HCTZ -Continue home metoprolol -Continue as needed hydralazine  Chronic COPD: Stable -As needed inhalers  BPH: -Continue home Flomax   Tobacco use disorder: Smokes about a pack a day. -Encourage cessation -Declined nicotine patch.  Peripheral neuropathy: Gabapentin 300 mg p.o. nightly  Body mass index is 28.2  kg/m. Nutrition Problem: Severe Malnutrition Etiology: social / environmental circumstances Artist of wife with Alzheimer's/likely neglects own needs) Signs/Symptoms: severe fat depletion,moderate fat depletion,mild muscle depletion,severe muscle depletion,moderate muscle depletion Interventions: Ensure Enlive (each supplement provides 350kcal and 20 grams of  protein),MVI,Prostat   DVT prophylaxis:  SCDs Start: 05/09/20 1054 enoxaparin (LOVENOX) injection 40 mg Start: 05/08/20 1000  Code Status: Full code Family Communication: Updated patient's son at bedside. Level of care: Telemetry Medical Status is: Inpatient  Remains inpatient appropriate because:IV treatments appropriate due to intensity of illness or inability to take PO and Inpatient level of care appropriate due to severity of illness   Dispo: The patient is from: Home              Anticipated d/c is to: Home              Patient currently is not medically stable to d/c.   Difficult to place patient No       Consultants:  Orthopedic surgery   Sch Meds:  Scheduled Meds: . (feeding supplement) PROSource Plus  30 mL Oral BID BM  . Chlorhexidine Gluconate Cloth  6 each Topical Q0600  . docusate sodium  100 mg Oral BID  . enoxaparin (LOVENOX) injection  40 mg Subcutaneous Q24H  . feeding supplement  237 mL Oral TID BM  . fentaNYL      . gabapentin  300 mg Oral QHS  . guaiFENesin  600 mg Oral BID  . methocarbamol      . metoprolol tartrate  25 mg Oral Daily  . multivitamin with minerals  1 tablet Oral Daily  . mupirocin ointment  1 application Nasal BID  . oxyCODONE      . tamsulosin  0.4 mg Oral Daily   Continuous Infusions: . sodium chloride 10 mL/hr at 05/09/20 1203  . lactated ringers 700 mL/hr at 05/09/20 0935  . magnesium sulfate bolus IVPB    . meropenem (MERREM) IV 1 g (05/08/20 2321)  . methocarbamol (ROBAXIN) IV    . vancomycin 750 mg (05/09/20 1205)   PRN Meds:.acetaminophen **OR** acetaminophen, albuterol, bisacodyl, hydrALAZINE, HYDROmorphone (DILAUDID) injection, magnesium citrate, methocarbamol **OR** methocarbamol (ROBAXIN) IV, metoCLOPramide **OR** metoCLOPramide (REGLAN) injection, ondansetron **OR** ondansetron (ZOFRAN) IV, oxyCODONE, oxyCODONE, polyethylene glycol  Antimicrobials: Anti-infectives (From admission, onward)   Start     Dose/Rate  Route Frequency Ordered Stop   05/08/20 1000  vancomycin (VANCOREADY) IVPB 750 mg/150 mL        750 mg 150 mL/hr over 60 Minutes Intravenous Every 12 hours 05/08/20 0213     05/08/20 0215  meropenem (MERREM) 1 g in sodium chloride 0.9 % 100 mL IVPB        1 g 200 mL/hr over 30 Minutes Intravenous Every 8 hours 05/08/20 0211     05/08/20 0215  vancomycin (VANCOREADY) IVPB 1500 mg/300 mL        1,500 mg 150 mL/hr over 120 Minutes Intravenous  Once 05/08/20 0211 05/08/20 0507       I have personally reviewed the following labs and images: CBC: Recent Labs  Lab 05/07/20 2333 05/08/20 0447 05/09/20 0301  WBC 11.9* 9.6 9.8  NEUTROABS 8.6* 6.9  --   HGB 13.4 13.4 12.6*  HCT 41.7 40.2 37.5*  MCV 98.8 96.6 96.2  PLT 294 278 275   BMP &GFR Recent Labs  Lab 05/07/20 2333 05/08/20 0447 05/09/20 0301  NA 133* 135 135  K 2.9* 3.0* 4.3  CL 97* 98 100  CO2 26  27 27  GLUCOSE 113* 102* 96  BUN _0 CREATININE 1.42* 1.25* 1.10  CALCIUM 8.5* 8.4* 8.5*  MG  --  1.8 1.5*   Estimated Creatinine Clearance: 64.9 mL/min (by C-G formula based on SCr of 1.1 mg/dL). Liver & Pancreas: Recent Labs  Lab 05/07/20 2333 05/08/20 0447  AST 18 17  ALT 12 12  ALKPHOS 60 49  BILITOT 0.7 0.9  PROT 8.3* 7.8  ALBUMIN 2.5* 2.3*   No results for input(s): LIPASE, AMYLASE in the last 168 hours. No results for input(s): AMMONIA in the last 168 hours. Diabetic: Recent Labs    05/08/20 0447  HGBA1C 6.1*   No results for input(s): GLUCAP in the last 168 hours. Cardiac Enzymes: No results for input(s): CKTOTAL, CKMB, CKMBINDEX, TROPONINI in the last 168 hours. No results for input(s): PROBNP in the last 8760 hours. Coagulation Profile: Recent Labs  Lab 05/07/20 2333 05/08/20 0447  INR 1.1 1.1   Thyroid Function Tests: No results for input(s): TSH, T4TOTAL, FREET4, T3FREE, THYROIDAB in the last 72 hours. Lipid Profile: No results for input(s): CHOL, HDL, LDLCALC, TRIG, CHOLHDL,  LDLDIRECT in the last 72 hours. Anemia Panel: No results for input(s): VITAMINB12, FOLATE, FERRITIN, TIBC, IRON, RETICCTPCT in the last 72 hours. Urine analysis:    Component Value Date/Time   COLORURINE AMBER (A) 05/07/2020 2310   APPEARANCEUR HAZY (A) 05/07/2020 2310   LABSPEC 1.026 05/07/2020 2310   PHURINE 5.0 05/07/2020 2310   GLUCOSEU NEGATIVE 05/07/2020 2310   HGBUR NEGATIVE 05/07/2020 2310   BILIRUBINUR NEGATIVE 05/07/2020 2310   KETONESUR NEGATIVE 05/07/2020 2310   PROTEINUR 30 (A) 05/07/2020 2310   UROBILINOGEN 2.0 (H) 08/20/2014 2345   NITRITE NEGATIVE 05/07/2020 2310   LEUKOCYTESUR NEGATIVE 05/07/2020 2310   Sepsis Labs: Invalid input(s): PROCALCITONIN, [place]  Microbiology: Recent Results (from the past 240 hour(s))  Culture, blood (Routine x 2)     Status: None (Preliminary result)   Collection Time: 05/07/20 11:00 PM   Specimen: BLOOD LEFT ARM  Result Value Ref Range Status   Specimen Description BLOOD LEFT ARM  Final   Special Requests   Final    BOTTLES DRAWN AEROBIC AND ANAEROBIC Blood Culture adequate volume   Culture   Final    NO GROWTH 1 DAY Performed at [facility], [address] [place], [place] [address]    Report Status PENDING  Incomplete  Culture, blood (Routine x 2)     Status: None (Preliminary result)   Collection Time: 05/07/20 11:27 PM   Specimen: BLOOD RIGHT ARM  Result Value Ref Range Status   Specimen Description BLOOD RIGHT ARM  Final   Special Requests AEROBIC BOTTLE ONLY Blood Culture adequate volume  Final   Culture   Final    NO GROWTH 1 DAY Performed at [facility], [place] [address], [place], [place] [address]    Report Status PENDING  Incomplete  Resp Panel by RT-PCR (Flu A&B, Covid) Nasopharyngeal Swab     Status: None   Collection Time: 05/08/20  1:38 AM   Specimen: Nasopharyngeal Swab; Nasopharyngeal(NP) swabs in vial transport medium  Result Value Ref Range Status   SARS Coronavirus 2 by RT PCR  NEGATIVE NEGATIVE Final    Comment: (NOTE) SARS-CoV-2 target nucleic acids are NOT DETECTED.  The SARS-CoV-2 RNA is generally detectable in upper respiratory specimens during the acute phase of infection. The lowest concentration of SARS-CoV-2 viral copies this assay can detect is 138 copies/mL.  A negative result does not preclude SARS-Cov-2 infection and should not be used as the sole basis for treatment or other patient management decisions. A negative result may occur with  improper specimen collection/handling, submission of specimen other than nasopharyngeal swab, presence of viral mutation(s) within the areas targeted by this assay, and inadequate number of viral copies(<138 copies/mL). A negative result must be combined with clinical observations, patient history, and epidemiological information. The expected result is Negative.  Fact Sheet for Patients:  EntrepreneurPulse.com.au  Fact Sheet for Healthcare Providers:  IncredibleEmployment.be  This test is no t yet approved or cleared by the [place] FDA and  has been authorized for detection and/or diagnosis of SARS-CoV-2 by FDA under an Emergency Use Authorization (EUA). This EUA will remain  in effect (meaning this test can be used) for the duration of the COVID-19 declaration under Section 564(b)(1) of the Act, 21 U.S.C.section 360bbb-3(b)(1), unless the authorization is terminated  or revoked sooner.       Influenza A by PCR NEGATIVE NEGATIVE Final   Influenza B by PCR NEGATIVE NEGATIVE Final    Comment: (NOTE) The Xpert Xpress SARS-CoV-2/FLU/RSV plus assay is intended as an aid in the diagnosis of influenza from Nasopharyngeal swab specimens and should not be used as a sole basis for treatment. Nasal washings and aspirates are unacceptable for Xpert Xpress SARS-CoV-2/FLU/RSV testing.  Fact Sheet for Patients: EntrepreneurPulse.com.au  Fact Sheet for  Healthcare Providers: IncredibleEmployment.be  This test is not yet approved or cleared by the [place] FDA and has been authorized for detection and/or diagnosis of SARS-CoV-2 by FDA under an Emergency Use Authorization (EUA). This EUA will remain in effect (meaning this test can be used) for the duration of the COVID-19 declaration under Section 564(b)(1) of the Act, 21 U.S.C. section 360bbb-3(b)(1), unless the authorization is terminated or revoked.  Performed at [facility], [place] [address], [place], [place] [address]   Aerobic Culture w Gram Stain (superficial specimen)     Status: None (Preliminary result)   Collection Time: 05/08/20  4:54 AM   Specimen: Wound  Result Value Ref Range Status   Specimen Description WOUND  Final   Special Requests FOOT RIGHT  Final   Gram Stain   Final    FEW WBC PRESENT,BOTH PMN AND MONONUCLEAR FEW GRAM POSITIVE COCCI    Culture   Final    ABUNDANT STAPHYLOCOCCUS AUREUS SUSCEPTIBILITIES TO FOLLOW Performed at [facility], [place] [address] [place], [place] [address]    Report Status PENDING  Incomplete  MRSA PCR Screening     Status: Abnormal   Collection Time: 05/09/20  7:41 AM   Specimen: Nasal Mucosa; Nasopharyngeal  Result Value Ref Range Status   MRSA by PCR POSITIVE (A) NEGATIVE Final    Comment:        The GeneXpert MRSA Assay (FDA approved for NASAL specimens only), is one component of a comprehensive MRSA colonization surveillance program. It is not intended to diagnose MRSA infection nor to guide or monitor treatment for MRSA infections. RESULT CALLED TO, READ BACK BY AND VERIFIED WITH: RN MARISSA [id number] MLM Performed at [facility], [place] [address], [place], [place] [address]     Radiology Studies: No results found.    Taye T. Lansdowne  If 7PM-7AM, please contact night-coverage [email or web address] 05/09/2020, 3:58 PM

## 2020-05-09 NOTE — Anesthesia Postprocedure Evaluation (Signed)
Anesthesia Post Note  Patient: Dylan Harrington  Procedure(s) Performed: IRRIGATION AND DEBRIDEMENT FOOT (Right Foot)     Patient location during evaluation: PACU Anesthesia Type: General Level of consciousness: awake and alert Pain management: pain level controlled Vital Signs Assessment: post-procedure vital signs reviewed and stable Respiratory status: spontaneous breathing, nonlabored ventilation, respiratory function stable and patient connected to nasal cannula oxygen Cardiovascular status: blood pressure returned to baseline and stable Postop Assessment: no apparent nausea or vomiting Anesthetic complications: no   No complications documented.  Last Vitals:  Vitals:   05/09/20 1018 05/09/20 1033  BP: 118/65 113/65  Pulse: 64 64  Resp: 18 19  Temp:  (!) 36.2 C  SpO2: 94% 93%    Last Pain:  Vitals:   05/09/20 1018  TempSrc:   PainSc: 6                  Kennieth Rad

## 2020-05-09 NOTE — Anesthesia Preprocedure Evaluation (Signed)
Anesthesia Evaluation  Patient identified by MRN, date of birth, ID band Patient awake    Reviewed: Allergy & Precautions, NPO status , Patient's Chart, lab work & pertinent test results  Airway Mallampati: III  TM Distance: >3 FB Neck ROM: Full    Dental  (+) Dental Advisory Given   Pulmonary sleep apnea , COPD, Current Smoker,    breath sounds clear to auscultation       Cardiovascular hypertension, Pt. on medications and Pt. on home beta blockers + pacemaker  Rhythm:Regular Rate:Normal     Neuro/Psych negative neurological ROS     GI/Hepatic negative GI ROS, Neg liver ROS,   Endo/Other  diabetes  Renal/GU Renal InsufficiencyRenal disease     Musculoskeletal   Abdominal   Peds  Hematology  (+) anemia ,   Anesthesia Other Findings   Reproductive/Obstetrics                             Anesthesia Physical Anesthesia Plan  ASA: III  Anesthesia Plan: General   Post-op Pain Management:    Induction: Intravenous  PONV Risk Score and Plan: 1 and Ondansetron, Dexamethasone and Treatment may vary due to age or medical condition  Airway Management Planned: LMA  Additional Equipment: None  Intra-op Plan:   Post-operative Plan: Extubation in OR  Informed Consent: I have reviewed the patients History and Physical, chart, labs and discussed the procedure including the risks, benefits and alternatives for the proposed anesthesia with the patient or authorized representative who has indicated his/her understanding and acceptance.     Dental advisory given  Plan Discussed with: CRNA  Anesthesia Plan Comments:         Anesthesia Quick Evaluation

## 2020-05-09 NOTE — Interval H&P Note (Signed)
History and Physical Interval Note:  05/09/2020 7:40 AM  [name]  has presented today for surgery, with the diagnosis of RIGHT FOOT ABSCESS.  The various methods of treatment have been discussed with the patient and family. After consideration of risks, benefits and other options for treatment, the patient has consented to  Procedure(s): IRRIGATION AND DEBRIDEMENT FOOT (Right) as a surgical intervention.  The patient's history has been reviewed, patient examined, no change in status, stable for surgery.  I have reviewed the patient's chart and labs.  Questions were answered to the patient's satisfaction.     Dylan Harrington

## 2020-05-09 NOTE — Evaluation (Signed)
Physical Therapy Evaluation Patient Details Name: Dylan Harrington MRN: [id number] DOB: 1940/12/16 Today's Date: 05/09/2020   History of Present Illness  The pt is a 80 yo male presentingto ED on 3/24 with x1 week of R foot wound with drainage. Pt found to have infection , and is now s/p I &D of R foot abcess with placement of wound vac on 3/26. PMH includes: COPD, glaucoma, HTN, OSA, spine, knee, and shoulder surgery.    Clinical Impression  Pt in bed upon arrival of PT, agreeable to evaluation at this time. Prior to admission the pt reports he was independent with all mobility without need for AD, is primary caregiver for wife, living in a home with 10 steps to enter. The pt now presents with limitations in functional mobility, strength, stability, power, and cognition due to above dx, and will continue to benefit from skilled PT to address these deficits. Today's eval was limited by lethargy and impaired cognition (likely related to medications), but the pt was able to demo good independence with bed mobility, requiring supervision for line management only. The pt was more limited with OOB transfers due to significant difficulty with adherence to cues for NWB RLE. The pt was unable to manage consistently despite repeated verbal cues, tactile cues, and attempts to facilitate RLE elevation in standing. Will need significant therapy to progress capacity for transfers, gait, and stair training, but pt stating goal of home with HHPT. If pt is unable to make substantial progress with OOB mobility when more alert, may need short stint SNF rehab as he states he has no one at home who could physically assist him.      Follow Up Recommendations Home health PT;Supervision for mobility/OOB (pending mobility progression when more alert)    Equipment Recommendations  Rolling walker with 5" wheels    Recommendations for Other Services       Precautions / Restrictions Precautions Precautions:  Fall Precaution Comments: wound vac Required Braces or Orthoses: Other Brace Other Brace: post-op shoe Restrictions Weight Bearing Restrictions: Yes RLE Weight Bearing: Non weight bearing      Mobility  Bed Mobility Overal bed mobility: Needs Assistance Bed Mobility: Rolling;Sidelying to Sit Rolling: Supervision Sidelying to sit: Supervision;HOB elevated       General bed mobility comments: supervision for line management and to avoid use of RLE, pt able to complete without assist, used bed rail and elevated HOB    Transfers Overall transfer level: Needs assistance Equipment used: Rolling walker (2 wheeled) Transfers: Sit to/from Stand Sit to Stand: Max assist         General transfer comment: pt able to power up with LLE from elevated bed with good power and decent stabilit, however, significant assist needed to maintain NWB RLE. attempted to maintain PT foot under pt foot to guage WB, pt continued to move his RLE away from PT, even while attempting to stand. Pt does best when repeatedly cued to maintain RLE off ground, maintains SLR in standing.  Ambulation/Gait             General Gait Details: unsafe to progress due to alerness and poor adherence to RLE NWB      Balance Overall balance assessment: Needs assistance Sitting-balance support: Feet supported;No upper extremity supported Sitting balance-Leahy Scale: Good     Standing balance support: Bilateral upper extremity supported Standing balance-Leahy Scale: Poor Standing balance comment: modA and BUE support with constant cues to maintain RLE NWB  Pertinent Vitals/Pain Pain Assessment: Faces Faces Pain Scale: No hurt    Home Living Family/patient expects to be discharged to:: Private residence Living Arrangements: Spouse/significant other Available Help at Discharge: Other (Comment) (pt reports he is caretaker for his wife) Type of Home: House Home Access:  Stairs to enter Entrance Stairs-Rails: Doctor, general practice of Steps: 10 Home Layout: One level Home Equipment: Shower seat;Grab bars - tub/shower Additional Comments: pt drowsy on eval, repeated same information x3 through session, but is worth checking when more alert    Prior Function Level of Independence: Independent         Comments: pt reports he is typically independent without AD, and is primary caregiver for his wife who requires physical assist with mobility and ADLs. 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 significant difficulty maintaining NWB at time of eval, more due to cog/alertness than strength.    Cervical / Trunk Assessment Cervical / Trunk Assessment: Normal  Communication   Communication: No difficulties  Cognition Arousal/Alertness: Lethargic;Suspect due to medications Behavior During Therapy: Impulsive;Flat affect Overall Cognitive Status: Impaired/Different from baseline Area of Impairment: Attention;Memory;Following commands;Safety/judgement;Problem solving                     Memory: Decreased recall of precautions;Decreased short-term memory Following Commands: Follows one step commands consistently Safety/Judgement: Decreased awareness of safety;Decreased awareness of deficits   Problem Solving: Slow processing;Decreased initiation;Difficulty sequencing;Requires verbal cues General Comments: Pt able to maintain alertness for short periods, maintained alertness with upright position and continued verbal cues/stim. The pt was able to follow simple commands, but was unable to recall  precautions/cues given during session despite repeated cues. Unable to recall WB precautions, boardeline impuslive movements at time      General Comments General comments (skin integrity, edema, etc.): SpO2 90-94% on RAwhen pleth good     Assessment/Plan    PT Assessment Patient needs continued PT services  PT Problem List Decreased strength;Decreased activity tolerance;Decreased balance;Decreased mobility;Decreased cognition;Decreased safety awareness;Pain       PT Treatment Interventions DME instruction;Gait training;Stair training;Functional mobility training;Therapeutic activities;Therapeutic exercise;Balance training;Patient/family education    PT Goals (Current goals can be found in the Care Plan section)  Acute Rehab PT Goals Patient Stated Goal: return home to wife PT Goal Formulation: With patient Time For Goal Achievement: 05/23/20 Potential to Achieve Goals: Good    Frequency Min 4X/week   Barriers to discharge Decreased caregiver support pt reports 10 steps to enter and that he is primary caregiver for wife (must physically assist her)       AM-PAC PT "6 Clicks" Mobility  Outcome Measure Help needed turning from your back to your side while in a flat bed without using bedrails?: None Help needed moving from lying on your back to sitting on the side of a flat bed without using bedrails?: A Little Help needed moving to and from a bed to a chair (including a wheelchair)?: A Lot Help needed standing up from a chair using your arms (e.g., wheelchair or bedside chair)?: A Lot Help needed to walk in hospital room?: Total Help needed climbing 3-5 steps with a railing? :  Total 6 Click Score: 13    End of Session Equipment Utilized During Treatment: Gait belt Activity Tolerance: Patient limited by lethargy Patient left: in bed;with call bell/phone within reach;with bed alarm set Nurse Communication: Mobility status PT Visit Diagnosis: Unsteadiness on feet  (R26.81);Other abnormalities of gait and mobility (R26.89);Muscle weakness (generalized) (M62.81)    Time: 8469-6295 PT Time Calculation (min) (ACUTE ONLY): 19 min   Charges:   PT Evaluation $PT Eval Moderate Complexity: 1 Mod          Rolm Baptise, PT, DPT   Acute Rehabilitation Department Pager #: [phone]  Gaetana Michaelis 05/09/2020, 2:43 PM

## 2020-05-10 ENCOUNTER — Encounter (HOSPITAL_COMMUNITY): Payer: Self-pay | Admitting: Orthopedic Surgery

## 2020-05-10 DIAGNOSIS — A419 Sepsis, unspecified organism: Secondary | ICD-10-CM | POA: Diagnosis not present

## 2020-05-10 DIAGNOSIS — I10 Essential (primary) hypertension: Secondary | ICD-10-CM | POA: Diagnosis not present

## 2020-05-10 DIAGNOSIS — J449 Chronic obstructive pulmonary disease, unspecified: Secondary | ICD-10-CM | POA: Diagnosis not present

## 2020-05-10 DIAGNOSIS — L03115 Cellulitis of right lower limb: Secondary | ICD-10-CM | POA: Diagnosis not present

## 2020-05-10 LAB — CBC
HCT: 35 % — ABNORMAL LOW (ref 39.0–52.0)
Hemoglobin: 11.3 g/dL — ABNORMAL LOW (ref 13.0–17.0)
MCH: 32 pg (ref 26.0–34.0)
MCHC: 32.3 g/dL (ref 30.0–36.0)
MCV: 99.2 fL (ref 80.0–100.0)
Platelets: 269 10*3/uL (ref 150–400)
RBC: 3.53 MIL/uL — ABNORMAL LOW (ref 4.22–5.81)
RDW: 14.2 % (ref 11.5–15.5)
WBC: 8.7 10*3/uL (ref 4.0–10.5)
nRBC: 0 % (ref 0.0–0.2)

## 2020-05-10 LAB — RENAL FUNCTION PANEL
Albumin: 2.1 g/dL — ABNORMAL LOW (ref 3.5–5.0)
Anion gap: 5 (ref 5–15)
BUN: 19 mg/dL (ref 8–23)
CO2: 29 mmol/L (ref 22–32)
Calcium: 7.9 mg/dL — ABNORMAL LOW (ref 8.9–10.3)
Chloride: 99 mmol/L (ref 98–111)
Creatinine, Ser: 1.17 mg/dL (ref 0.61–1.24)
GFR, Estimated: >60 mL/min (ref >60)
Glucose, Bld: 112 mg/dL — ABNORMAL HIGH (ref 70–99)
Phosphorus: 3.1 mg/dL (ref 2.5–4.6)
Potassium: 4.4 mmol/L (ref 3.5–5.1)
Sodium: 133 mmol/L — ABNORMAL LOW (ref 135–145)

## 2020-05-10 LAB — AEROBIC CULTURE W GRAM STAIN (SUPERFICIAL SPECIMEN)

## 2020-05-10 LAB — MAGNESIUM: Magnesium: 1.8 mg/dL (ref 1.7–2.4)

## 2020-05-10 MED ORDER — TETANUS-DIPHTH-ACELL PERTUSSIS 5-2.5-18.5 LF-MCG/0.5 IM SUSY
0.5000 mL | PREFILLED_SYRINGE | Freq: Once | INTRAMUSCULAR | Status: AC
  Administered 2020-05-10: 0.5 mL via INTRAMUSCULAR
  Filled 2020-05-10: qty 0.5

## 2020-05-10 NOTE — Progress Notes (Signed)
Patient ID: Dylan Harrington, male   DOB: 10-12-1940, 80 y.o.   MRN: [id number] Patient is postoperative day 1 excisional debridement right heel abscess.  Cultures are pending from the necrotic soft tissue that was sent.  There is 25 cc in the wound VAC canister.  Patient's abscess involve the skin, soft tissue, fascia, and muscle, it did not extend down to bone.  Anticipate patient will discharge with the portable Praveena pump for the wound VAC and antibiotics depending on culture sensitivities.

## 2020-05-10 NOTE — TOC Initial Note (Signed)
Transition of Care [facility]) - Initial/Assessment Note    Patient Details  Name: Dylan Harrington MRN: [id number] Date of Birth: 10-14-1940  Transition of Care [facility]) CM/SW Contact:    Kermit Balo, RN Phone Number: 05/10/2020, 3:25 PM  Clinical Narrative:                 Patient states he lives at home with his spouse, 2 sons and DIL. Pt has needed supervision at home.  Recommendations for [facility] services. Pt has no preference on [place] agency. HH arranged through [facility].  Pt denies any issues with home medications or transportation.  TOC following.  Expected Discharge Plan: Home w Home Health Services Barriers to Discharge: Continued Medical Work up   Patient Goals and CMS Choice   CMS Medicare.gov Compare Post Acute Care list provided to:: Patient Choice offered to / list presented to : Patient  Expected Discharge Plan and Services Expected Discharge Plan: Home w Home Health Services   Discharge Planning Services: CM Consult Post Acute Care Choice: Home Health Living arrangements for the past 2 months: Single Family Home                           HH Arranged: PT HH Agency: [facility] Date [facility] Agency Contacted: 05/10/20   Representative spoke with at [facility] Agency: Kandee Keen  Prior Living Arrangements/Services Living arrangements for the past 2 months: Single Family Home Lives with:: Spouse,Adult Children Patient language and need for interpreter reviewed:: Yes Do you feel safe going back to the place where you live?: Yes        Care giver support system in place?: Yes (comment) Current home services: DME (walker/ cane) Criminal Activity/Legal Involvement Pertinent to Current Situation/Hospitalization: No - Comment as needed  Activities of Daily Living Home Assistive Devices/Equipment: None ADL Screening (condition at time of admission) Patient's cognitive ability adequate to safely complete daily activities?: Yes Is the patient deaf or have difficulty hearing?:  No Does the patient have difficulty seeing, even when wearing glasses/contacts?: No Does the patient have difficulty concentrating, remembering, or making decisions?: No Patient able to express need for assistance with ADLs?: Yes Does the patient have difficulty dressing or bathing?: No Independently performs ADLs?: Yes (appropriate for developmental age) Does the patient have difficulty walking or climbing stairs?: No Weakness of Legs: Right Weakness of Arms/Hands: None  Permission Sought/Granted                  Emotional Assessment Appearance:: Appears stated age Attitude/Demeanor/Rapport: Engaged Affect (typically observed): Accepting Orientation: : Oriented to Self,Oriented to Place,Oriented to  Time,Oriented to Situation   Psych Involvement: No (comment)  Admission diagnosis:  Cellulitis of right foot [L03.115] Foot abscess, right [L02.611] Patient Active Problem List   Diagnosis Date Noted  . Cellulitis of right foot 05/08/2020  . Skin ulcer of plantar aspect of foot (HCC) 05/08/2020  . Nicotine dependence, cigarettes, uncomplicated 05/08/2020  . 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 Lactic acidosis 05/08/2020  . Hypokalemia 05/08/2020  . Protein-calorie malnutrition, severe 05/08/2020  . Foot abscess, right 05/08/2020  . Cutaneous abscess of right foot   . Obstructive sleep apnea 03/15/2016  . Essential hypertension 03/15/2016  . COPD (chronic obstructive pulmonary disease) (HCC) 03/14/2016   PCP:  Winifred Olive, MD Pharmacy:   [facility] [address], [place] - [address] [place] [address] Phone: [phone] Fax: [phone]  Social Determinants of Health (SDOH) Interventions    Readmission Risk Interventions No flowsheet data found.

## 2020-05-10 NOTE — Progress Notes (Signed)
PROGRESS NOTE  CAPONE SCHWINN HUO:[id number] DOB: Feb 24, 1940   PCP: Vivia Birmingham, MD  Patient is from: Home  DOA: 05/07/2020 LOS: 2  Chief complaints: Right foot pain  Brief Narrative / Interim history: 80 year old M with PMH of COPD, OSA, HTN, BPH, neuropathy and tobacco use disorder presented to ED with worsening right foot pain.  Apparently stepped on unknown sharp object inside his house about 10 days prior that resulted in a small wound at the bottom of his foot with severe pain, redness and swelling that has gotten worse with purulent drainage despite treatment with p.o. Bactrim for 7 days at PCP office.   He was a started on vancomycin and meropenem.  He underwent excisional debridement and wound VAC placement of right foot by Dr. Sharol Given on 3/26.  Superficial wound culture with MRSA.  Deep wound culture with moderate staph aureus.  Antibiotics escalated to vancomycin.  Subjective: Seen and examined earlier this morning.  No major events overnight of this morning.  Right foot pain well controlled.  He denies chest pain, dyspnea, GI or UTI symptoms.  Objective: Vitals:   05/09/20 2032 05/10/20 0056 05/10/20 0523 05/10/20 0804  BP: 115/60 107/71 119/66 126/64  Pulse: 83 67 72 66  Resp: 20 16  17   Temp: 99.9 F (37.7 C) 98.9 F (37.2 C) 98.3 F (36.8 C) 98.9 F (37.2 C)  TempSrc: Oral Oral Oral Oral  SpO2: 98% 98% 98% 96%  Weight:      Height:        Intake/Output Summary (Last 24 hours) at 05/10/2020 1349 Last data filed at 05/10/2020 1100 Gross per 24 hour  Intake 970 ml  Output 575 ml  Net 395 ml   Filed Weights   05/07/20 2308 05/08/20 1355  Weight: 94.3 kg 94.3 kg    Examination:  GENERAL: No apparent distress.  Nontoxic. HEENT: MMM.  Vision and hearing grossly intact.  NECK: Supple.  No apparent JVD.  RESP: On RA.  No IWOB.  Fair aeration bilaterally. CVS:  RRR. Heart sounds normal.  ABD/GI/GU: BS+. Abd soft, NTND.  MSK/EXT:  Moves extremities.  Wound  VAC over right foot.  About 25 cc serosanguineous fluid SKIN: no apparent skin lesion or wound NEURO: Awake, alert and oriented appropriately.  No apparent focal neuro deficit. PSYCH: Calm. Normal affect.  Procedures:  3/26-excisional debridement and wound VAC placement of right foot by Dr. Sharol Given  Microbiology summarized: COVID-19 and influenza PCR nonreactive. Superficial wound culture with MRSA Blood cultures NGTD Surgical culture with staph aureus  Assessment & Plan: Severe sepsis due to right foot MRSA abscess/cellulitis-following injury after stepping on unknown object at home.  POA.  Met criteria with leukocytosis, tachypnea, AKI and lactic acidosis on presentation.  Failed outpatient treatment with oral Bactrim.  Cultures as above. -S/p excisional debridement on 3/26 -Continue vancomycin  -Discontinue IV meropenem -Tdap vaccine.  Patient is unsure about tetanus status -Incentive telemetry  AKI: Resolved. Recent Labs    05/07/20 2333 05/08/20 0447 05/09/20 0301 05/10/20 0136  BUN 23 21 16 19   CREATININE 1.42* 1.25* 1.10 1.17  -Continue monitoring -Continue holding lisinopril and HCTZ  Essential hypertension: Normotensive. -Continue holding lisinopril and HCTZ -Continue home metoprolol -Continue as needed hydralazine  Chronic COPD: Stable -As needed inhalers -Encourage incentive telemetry  BPH: -Continue home Flomax   Tobacco use disorder: Smokes about a pack a day. -Encourage cessation -Declined nicotine patch.  Peripheral neuropathy: Gabapentin 300 mg p.o. nightly  Body mass index is 28.2 kg/m.  Nutrition Problem: Severe Malnutrition Etiology: social / environmental circumstances Artist of wife with Alzheimer's/likely neglects own needs) Signs/Symptoms: severe fat depletion,moderate fat depletion,mild muscle depletion,severe muscle depletion,moderate muscle depletion Interventions: Ensure Enlive (each supplement provides 350kcal and 20 grams of  protein),MVI,Prostat   DVT prophylaxis:  SCDs Start: 05/09/20 1054 enoxaparin (LOVENOX) injection 40 mg Start: 05/08/20 1000  Code Status: Full code Family Communication: Updated patient's son at bedside on 3/26. Level of care: Telemetry Medical Status is: Inpatient  Remains inpatient appropriate because:IV treatments appropriate due to intensity of illness or inability to take PO and Inpatient level of care appropriate due to severity of illness   Dispo: The patient is from: Home              Anticipated d/c is to: Home              Patient currently is not medically stable to d/c.   Difficult to place patient No       Consultants:  Orthopedic surgery   Sch Meds:  Scheduled Meds: . (feeding supplement) PROSource Plus  30 mL Oral BID BM  . Chlorhexidine Gluconate Cloth  6 each Topical Q0600  . docusate sodium  100 mg Oral BID  . enoxaparin (LOVENOX) injection  40 mg Subcutaneous Q24H  . feeding supplement  237 mL Oral TID BM  . gabapentin  300 mg Oral QHS  . guaiFENesin  600 mg Oral BID  . metoprolol tartrate  25 mg Oral Daily  . multivitamin with minerals  1 tablet Oral Daily  . mupirocin ointment  1 application Nasal BID  . tamsulosin  0.4 mg Oral Daily   Continuous Infusions: . sodium chloride 10 mL/hr at 05/09/20 1203  . lactated ringers 700 mL/hr at 05/09/20 0935  . meropenem (MERREM) IV 1 g (05/10/20 0949)  . methocarbamol (ROBAXIN) IV    . vancomycin 750 mg (05/10/20 0847)   PRN Meds:.acetaminophen **OR** acetaminophen, albuterol, bisacodyl, hydrALAZINE, HYDROmorphone (DILAUDID) injection, magnesium citrate, methocarbamol **OR** methocarbamol (ROBAXIN) IV, metoCLOPramide **OR** metoCLOPramide (REGLAN) injection, ondansetron **OR** ondansetron (ZOFRAN) IV, oxyCODONE, oxyCODONE, polyethylene glycol  Antimicrobials: Anti-infectives (From admission, onward)   Start     Dose/Rate Route Frequency Ordered Stop   05/08/20 1000  vancomycin (VANCOREADY) IVPB 750  mg/150 mL        750 mg 150 mL/hr over 60 Minutes Intravenous Every 12 hours 05/08/20 0213     05/08/20 0215  meropenem (MERREM) 1 g in sodium chloride 0.9 % 100 mL IVPB        1 g 200 mL/hr over 30 Minutes Intravenous Every 8 hours 05/08/20 0211     05/08/20 0215  vancomycin (VANCOREADY) IVPB 1500 mg/300 mL        1,500 mg 150 mL/hr over 120 Minutes Intravenous  Once 05/08/20 0211 05/08/20 0507       I have personally reviewed the following labs and images: CBC: Recent Labs  Lab 05/07/20 2333 05/08/20 0447 05/09/20 0301 05/10/20 0136  WBC 11.9* 9.6 9.8 8.7  NEUTROABS 8.6* 6.9  --   --   HGB 13.4 13.4 12.6* 11.3*  HCT 41.7 40.2 37.5* 35.0*  MCV 98.8 96.6 96.2 99.2  PLT 294 278 275 269   BMP &GFR Recent Labs  Lab 05/07/20 2333 05/08/20 0447 05/09/20 0301 05/10/20 0136  NA 133* 135 135 133*  K 2.9* 3.0* 4.3 4.4  CL 97* 98 100 99  CO2 26 27 27 29   GLUCOSE 113* 102* 96 112*  BUN 23 21  16 19  CREATININE 1.42* 1.25* 1.10 1.17  CALCIUM 8.5* 8.4* 8.5* 7.9*  MG  --  1.8 1.5* 1.8  PHOS  --   --   --  3.1   Estimated Creatinine Clearance: 61 mL/min (by C-G formula based on SCr of 1.17 mg/dL). Liver & Pancreas: Recent Labs  Lab 05/07/20 2333 05/08/20 0447 05/10/20 0136  AST 18 17  --   ALT 12 12  --   ALKPHOS 60 49  --   BILITOT 0.7 0.9  --   PROT 8.3* 7.8  --   ALBUMIN 2.5* 2.3* 2.1*   No results for input(s): LIPASE, AMYLASE in the last 168 hours. No results for input(s): AMMONIA in the last 168 hours. Diabetic: Recent Labs    05/08/20 0447  HGBA1C 6.1*   No results for input(s): GLUCAP in the last 168 hours. Cardiac Enzymes: No results for input(s): CKTOTAL, CKMB, CKMBINDEX, TROPONINI in the last 168 hours. No results for input(s): PROBNP in the last 8760 hours. Coagulation Profile: Recent Labs  Lab 05/07/20 2333 05/08/20 0447  INR 1.1 1.1   Thyroid Function Tests: No results for input(s): TSH, T4TOTAL, FREET4, T3FREE, THYROIDAB in the last 72  hours. Lipid Profile: No results for input(s): CHOL, HDL, LDLCALC, TRIG, CHOLHDL, LDLDIRECT in the last 72 hours. Anemia Panel: No results for input(s): VITAMINB12, FOLATE, FERRITIN, TIBC, IRON, RETICCTPCT in the last 72 hours. Urine analysis:    Component Value Date/Time   COLORURINE AMBER (A) 05/07/2020 2310   APPEARANCEUR HAZY (A) 05/07/2020 2310   LABSPEC 1.026 05/07/2020 2310   PHURINE 5.0 05/07/2020 2310   GLUCOSEU NEGATIVE 05/07/2020 2310   HGBUR NEGATIVE 05/07/2020 2310   BILIRUBINUR NEGATIVE 05/07/2020 2310   KETONESUR NEGATIVE 05/07/2020 2310   PROTEINUR 30 (A) 05/07/2020 2310   UROBILINOGEN 2.0 (H) 08/20/2014 2345   NITRITE NEGATIVE 05/07/2020 2310   LEUKOCYTESUR NEGATIVE 05/07/2020 2310   Sepsis Labs: Invalid input(s): PROCALCITONIN, [place]  Microbiology: Recent Results (from the past 240 hour(s))  Culture, blood (Routine x 2)     Status: None (Preliminary result)   Collection Time: 05/07/20 11:00 PM   Specimen: BLOOD LEFT ARM  Result Value Ref Range Status   Specimen Description BLOOD LEFT ARM  Final   Special Requests   Final    BOTTLES DRAWN AEROBIC AND ANAEROBIC Blood Culture adequate volume   Culture   Final    NO GROWTH 1 DAY Performed at [facility], [address] [place], [place] [address]    Report Status PENDING  Incomplete  Culture, blood (Routine x 2)     Status: None (Preliminary result)   Collection Time: 05/07/20 11:27 PM   Specimen: BLOOD RIGHT ARM  Result Value Ref Range Status   Specimen Description BLOOD RIGHT ARM  Final   Special Requests AEROBIC BOTTLE ONLY Blood Culture adequate volume  Final   Culture   Final    NO GROWTH 1 DAY Performed at [facility], [place] [address], [place], [place] [address]    Report Status PENDING  Incomplete  Resp Panel by RT-PCR (Flu A&B, Covid) Nasopharyngeal Swab     Status: None   Collection Time: 05/08/20  1:38 AM   Specimen: Nasopharyngeal Swab; Nasopharyngeal(NP) swabs in vial  transport medium  Result Value Ref Range Status   SARS Coronavirus 2 by RT PCR NEGATIVE NEGATIVE Final    Comment: (NOTE) SARS-CoV-2 target nucleic acids are NOT DETECTED.  The SARS-CoV-2 RNA is generally detectable in  upper respiratory specimens during the acute phase of infection. The lowest concentration of SARS-CoV-2 viral copies this assay can detect is 138 copies/mL. A negative result does not preclude SARS-Cov-2 infection and should not be used as the sole basis for treatment or other patient management decisions. A negative result may occur with  improper specimen collection/handling, submission of specimen other than nasopharyngeal swab, presence of viral mutation(s) within the areas targeted by this assay, and inadequate number of viral copies(<138 copies/mL). A negative result must be combined with clinical observations, patient history, and epidemiological information. The expected result is Negative.  Fact Sheet for Patients:  EntrepreneurPulse.com.au  Fact Sheet for Healthcare Providers:  IncredibleEmployment.be  This test is no t yet approved or cleared by the [place] FDA and  has been authorized for detection and/or diagnosis of SARS-CoV-2 by FDA under an Emergency Use Authorization (EUA). This EUA will remain  in effect (meaning this test can be used) for the duration of the COVID-19 declaration under Section 564(b)(1) of the Act, 21 U.S.C.section 360bbb-3(b)(1), unless the authorization is terminated  or revoked sooner.       Influenza A by PCR NEGATIVE NEGATIVE Final   Influenza B by PCR NEGATIVE NEGATIVE Final    Comment: (NOTE) The Xpert Xpress SARS-CoV-2/FLU/RSV plus assay is intended as an aid in the diagnosis of influenza from Nasopharyngeal swab specimens and should not be used as a sole basis for treatment. Nasal washings and aspirates are unacceptable for Xpert Xpress SARS-CoV-2/FLU/RSV testing.  Fact  Sheet for Patients: EntrepreneurPulse.com.au  Fact Sheet for Healthcare Providers: IncredibleEmployment.be  This test is not yet approved or cleared by the [place] FDA and has been authorized for detection and/or diagnosis of SARS-CoV-2 by FDA under an Emergency Use Authorization (EUA). This EUA will remain in effect (meaning this test can be used) for the duration of the COVID-19 declaration under Section 564(b)(1) of the Act, 21 U.S.C. section 360bbb-3(b)(1), unless the authorization is terminated or revoked.  Performed at [facility], [place] [address] [place], [place] [address]   Aerobic Culture w Gram Stain (superficial specimen)     Status: None   Collection Time: 05/08/20  4:54 AM   Specimen: Wound  Result Value Ref Range Status   Specimen Description WOUND  Final   Special Requests FOOT RIGHT  Final   Gram Stain   Final    FEW WBC PRESENT,BOTH PMN AND MONONUCLEAR FEW GRAM POSITIVE COCCI Performed at [facility], [address], [place], [place] [address]    Culture   Final    ABUNDANT METHICILLIN RESISTANT STAPHYLOCOCCUS AUREUS   Report Status 05/10/2020 FINAL  Final   Organism ID, Bacteria METHICILLIN RESISTANT STAPHYLOCOCCUS AUREUS  Final      Susceptibility   Methicillin resistant staphylococcus aureus - MIC*    CIPROFLOXACIN >=8 RESISTANT Resistant     ERYTHROMYCIN >=8 RESISTANT Resistant     GENTAMICIN <=0.5 SENSITIVE Sensitive     OXACILLIN >=4 RESISTANT Resistant     TETRACYCLINE >=16 RESISTANT Resistant     VANCOMYCIN <=0.5 SENSITIVE Sensitive     TRIMETH/SULFA >=320 RESISTANT Resistant     CLINDAMYCIN <=0.25 SENSITIVE Sensitive     RIFAMPIN <=0.5 SENSITIVE Sensitive     Inducible Clindamycin NEGATIVE Sensitive     * ABUNDANT METHICILLIN RESISTANT STAPHYLOCOCCUS AUREUS  MRSA PCR Screening     Status: Abnormal   Collection Time: 05/09/20  7:41 AM   Specimen: Nasal Mucosa; Nasopharyngeal  Result Value  Ref  Range Status   MRSA by PCR POSITIVE (A) NEGATIVE Final    Comment:        The GeneXpert MRSA Assay (FDA approved for NASAL specimens only), is one component of a comprehensive MRSA colonization surveillance program. It is not intended to diagnose MRSA infection nor to guide or monitor treatment for MRSA infections. RESULT CALLED TO, READ BACK BY AND VERIFIED WITH: RN MARISSA [id number] MLM Performed at [facility], [place] [address] [place], [place] [address]   Aerobic/Anaerobic Culture w Gram Stain (surgical/deep wound)     Status: None (Preliminary result)   Collection Time: 05/09/20  9:18 AM   Specimen: PATH Soft tissue  Result Value Ref Range Status   Specimen Description TISSUE  Final   Special Requests FOOT RIGHT  Final   Gram Stain NO WBC SEEN NO ORGANISMS SEEN   Final   Culture   Final    MODERATE STAPHYLOCOCCUS AUREUS SUSCEPTIBILITIES TO FOLLOW CRITICAL RESULT CALLED TO, READ BACK BY AND VERIFIED WITH: RN Rogue Jury [id number] FCP Performed at [facility], [place] [address] [place], [place] [address]    Report Status PENDING  Incomplete    Radiology Studies: No results found.    Taye T. [place]  If 7PM-7AM, please contact night-coverage [email or web address] 05/10/2020, 1:49 PM

## 2020-05-10 NOTE — Plan of Care (Signed)
Patient is s/p I&D with wound vac placement by Dr. Lajoyce Corners. Wound vac plugged in and working WNL. Scant amount of serosanguineous drainage noted. Patient is not complaining of any pain at this time. IV antibx given as ordered. Patient is very pleasant. NAD or needs voiced. Will continue to monitor and continue current POC.

## 2020-05-10 NOTE — Plan of Care (Signed)
No acute changes since the previous night that I took care of him. Patient is in NAD and not complaining of any pain at this time. Wound vac at in place and IV antibx given as ordered. Discharge plan is home with home health. Will continue to monitor and continue current POC.

## 2020-05-11 DIAGNOSIS — J449 Chronic obstructive pulmonary disease, unspecified: Secondary | ICD-10-CM | POA: Diagnosis not present

## 2020-05-11 DIAGNOSIS — L03115 Cellulitis of right lower limb: Secondary | ICD-10-CM | POA: Diagnosis not present

## 2020-05-11 DIAGNOSIS — Z9989 Dependence on other enabling machines and devices: Secondary | ICD-10-CM

## 2020-05-11 DIAGNOSIS — G4733 Obstructive sleep apnea (adult) (pediatric): Secondary | ICD-10-CM

## 2020-05-11 DIAGNOSIS — A419 Sepsis, unspecified organism: Secondary | ICD-10-CM | POA: Diagnosis not present

## 2020-05-11 DIAGNOSIS — I10 Essential (primary) hypertension: Secondary | ICD-10-CM | POA: Diagnosis not present

## 2020-05-11 DIAGNOSIS — B9562 Methicillin resistant Staphylococcus aureus infection as the cause of diseases classified elsewhere: Secondary | ICD-10-CM

## 2020-05-11 DIAGNOSIS — L02611 Cutaneous abscess of right foot: Secondary | ICD-10-CM

## 2020-05-11 DIAGNOSIS — L89896 Pressure-induced deep tissue damage of other site: Secondary | ICD-10-CM

## 2020-05-11 LAB — VANCOMYCIN, TROUGH: Vancomycin Tr: 12 ug/mL — ABNORMAL LOW (ref 15–20)

## 2020-05-11 LAB — CBC
HCT: 35.7 % — ABNORMAL LOW (ref 39.0–52.0)
Hemoglobin: 11.6 g/dL — ABNORMAL LOW (ref 13.0–17.0)
MCH: 32.1 pg (ref 26.0–34.0)
MCHC: 32.5 g/dL (ref 30.0–36.0)
MCV: 98.9 fL (ref 80.0–100.0)
Platelets: 251 10*3/uL (ref 150–400)
RBC: 3.61 MIL/uL — ABNORMAL LOW (ref 4.22–5.81)
RDW: 14 % (ref 11.5–15.5)
WBC: 6.5 10*3/uL (ref 4.0–10.5)
nRBC: 0 % (ref 0.0–0.2)

## 2020-05-11 LAB — RENAL FUNCTION PANEL
Albumin: 2 g/dL — ABNORMAL LOW (ref 3.5–5.0)
Anion gap: 3 — ABNORMAL LOW (ref 5–15)
BUN: 20 mg/dL (ref 8–23)
CO2: 30 mmol/L (ref 22–32)
Calcium: 8 mg/dL — ABNORMAL LOW (ref 8.9–10.3)
Chloride: 102 mmol/L (ref 98–111)
Creatinine, Ser: 0.92 mg/dL (ref 0.61–1.24)
GFR, Estimated: >60 mL/min (ref >60)
Glucose, Bld: 169 mg/dL — ABNORMAL HIGH (ref 70–99)
Phosphorus: 2 mg/dL — ABNORMAL LOW (ref 2.5–4.6)
Potassium: 3.9 mmol/L (ref 3.5–5.1)
Sodium: 135 mmol/L (ref 135–145)

## 2020-05-11 LAB — VANCOMYCIN, PEAK
Vancomycin Pk: 10 ug/mL — ABNORMAL LOW (ref 30–40)
Vancomycin Pk: 22 ug/mL — ABNORMAL LOW (ref 30–40)

## 2020-05-11 LAB — MAGNESIUM: Magnesium: 2 mg/dL (ref 1.7–2.4)

## 2020-05-11 MED ORDER — HYDROMORPHONE HCL 1 MG/ML IJ SOLN
0.5000 mg | INTRAMUSCULAR | Status: DC | PRN
Start: 2020-05-11 — End: 2020-05-19

## 2020-05-11 MED ORDER — METRONIDAZOLE 500 MG PO TABS
500.0000 mg | ORAL_TABLET | Freq: Three times a day (TID) | ORAL | Status: DC
  Administered 2020-05-11 – 2020-05-19 (×24): 500 mg via ORAL
  Filled 2020-05-11 (×25): qty 1

## 2020-05-11 MED ORDER — OXYCODONE HCL 5 MG PO TABS
5.0000 mg | ORAL_TABLET | Freq: Four times a day (QID) | ORAL | Status: DC | PRN
  Administered 2020-05-11 – 2020-05-18 (×10): 5 mg via ORAL
  Filled 2020-05-11 (×10): qty 1

## 2020-05-11 NOTE — Progress Notes (Signed)
Pt placed on cpap with a pressure of 10. Tolerating well, will continue to monitor.

## 2020-05-11 NOTE — Progress Notes (Signed)
PROGRESS NOTE  Dylan Harrington UXL:[id number] DOB: 09-15-1940   PCP: Vivia Birmingham, MD  Patient is from: Home  DOA: 05/07/2020 LOS: 3  Chief complaints: Right foot pain  Brief Narrative / Interim history: 80 year old M with PMH of COPD, OSA, HTN, BPH, neuropathy and tobacco use disorder presented to ED with worsening right foot pain.  Apparently stepped on unknown sharp object inside his house about 10 days prior that resulted in a small wound at the bottom of his foot with severe pain, redness and swelling that has gotten worse with purulent drainage despite treatment with p.o. Bactrim for 7 days at PCP office.   He was a started on vancomycin and meropenem.  He underwent excisional debridement and wound VAC placement of right foot by Dr. Sharol Given on 3/26.  Superficial wound culture with MRSA.  Deep wound culture with moderate staph aureus.  Antibiotics escalated to vancomycin.  Infectious disease consulted.  Subjective: Seen and examined earlier this morning.  No major events overnight of this morning.  No complaints.  Pain fairly controlled.  He denies chest pain, dyspnea, GI or UTI symptoms.  Objective: Vitals:   05/10/20 1926 05/11/20 0459 05/11/20 0802 05/11/20 1022  BP: 111/73 128/75 125/67   Pulse: 68 66 76   Resp: (!) 22 16 16    Temp: 98 F (36.7 C) 98.6 F (37 C) 98.7 F (37.1 C) 99.4 F (37.4 C)  TempSrc: Oral Oral Oral Axillary  SpO2: 91% 98% 93%   Weight:      Height:        Intake/Output Summary (Last 24 hours) at 05/11/2020 1419 Last data filed at 05/11/2020 1000 Gross per 24 hour  Intake 480 ml  Output 1200 ml  Net -720 ml   Filed Weights   05/07/20 2308 05/08/20 1355  Weight: 94.3 kg 94.3 kg    Examination:  GENERAL: No apparent distress.  Nontoxic. HEENT: MMM.  Vision and hearing grossly intact.  NECK: Supple.  No apparent JVD.  RESP: On RA.  No IWOB.  Fair aeration bilaterally. CVS:  RRR. Heart sounds normal.  ABD/GI/GU: BS+. Abd soft, NTND.   MSK/EXT:  Moves extremities.  Wound VAC over right foot.  About 25 cc serosanguineous fluid. SKIN: no apparent skin lesion or wound NEURO: Awake, alert and oriented appropriately.  No apparent focal neuro deficit. PSYCH: Calm. Normal affect.  Procedures:  3/26-excisional debridement and wound VAC placement of right foot by Dr. Sharol Given  Microbiology summarized: COVID-19 and influenza PCR nonreactive. Superficial and surgical wound culture with MRSA Blood cultures NGTD  Assessment & Plan: Severe sepsis due to right foot MRSA abscess/cellulitis-following injury after stepping on unknown object at home.  POA.  Met criteria with leukocytosis, tachypnea, AKI and lactic acidosis on presentation.  Failed outpatient treatment with oral Bactrim.  Cultures as above. -S/p excisional debridement on 3/26 -Continue vancomycin 3/24>> -IV meropenem 3/24-3/28 -ID consulted-will follow recommendations -Tdap vaccine.  Patient is unsure about tetanus status -Incentive telemetry  AKI: Resolved. Recent Labs    05/07/20 2333 05/08/20 0447 05/09/20 0301 05/10/20 0136 05/11/20 0831  BUN 23 21 16 19 20   CREATININE 1.42* 1.25* 1.10 1.17 0.92  -Continue monitoring -Continue holding lisinopril and HCTZ  Essential hypertension: Normotensive. -Continue holding lisinopril and HCTZ -Continue home metoprolol -Continue as needed hydralazine  Chronic COPD: Stable -As needed inhalers -Encourage incentive telemetry  OSA: -CPAP as needed and at night -Wean pain medications  BPH: -Continue home Flomax   Tobacco use disorder: Smokes about a pack  a day. -Encouraged cessation.  He is determined to quit and save his leg -Declined nicotine patch.  Peripheral neuropathy: Gabapentin 300 mg p.o. nightly  Body mass index is 28.2 kg/m. Nutrition Problem: Severe Malnutrition Etiology: social / environmental circumstances Artist of wife with Alzheimer's/likely neglects own needs) Signs/Symptoms: severe  fat depletion,moderate fat depletion,mild muscle depletion,severe muscle depletion,moderate muscle depletion Interventions: Ensure Enlive (each supplement provides 350kcal and 20 grams of protein),MVI,Prostat   DVT prophylaxis:  SCDs Start: 05/09/20 1054 enoxaparin (LOVENOX) injection 40 mg Start: 05/08/20 1000  Code Status: Full code Family Communication: Patient and Therapist, sports.  No family member at bedside. Level of care: Telemetry Medical Status is: Inpatient  Remains inpatient appropriate because:IV treatments appropriate due to intensity of illness or inability to take PO and Inpatient level of care appropriate due to severity of illness   Dispo: The patient is from: Home              Anticipated d/c is to: Home              Patient currently is not medically stable to d/c.   Difficult to place patient No       Consultants:  Orthopedic surgery Infectious disease   Sch Meds:  Scheduled Meds: . (feeding supplement) PROSource Plus  30 mL Oral BID BM  . Chlorhexidine Gluconate Cloth  6 each Topical Q0600  . docusate sodium  100 mg Oral BID  . enoxaparin (LOVENOX) injection  40 mg Subcutaneous Q24H  . feeding supplement  237 mL Oral TID BM  . gabapentin  300 mg Oral QHS  . guaiFENesin  600 mg Oral BID  . metoprolol tartrate  25 mg Oral Daily  . metroNIDAZOLE  500 mg Oral Q8H  . multivitamin with minerals  1 tablet Oral Daily  . mupirocin ointment  1 application Nasal BID  . tamsulosin  0.4 mg Oral Daily   Continuous Infusions: . sodium chloride 10 mL/hr at 05/10/20 1811  . lactated ringers 700 mL/hr at 05/09/20 0935  . methocarbamol (ROBAXIN) IV    . vancomycin 750 mg (05/11/20 1356)   PRN Meds:.acetaminophen **OR** acetaminophen, albuterol, bisacodyl, hydrALAZINE, HYDROmorphone (DILAUDID) injection, magnesium citrate, methocarbamol **OR** methocarbamol (ROBAXIN) IV, metoCLOPramide **OR** metoCLOPramide (REGLAN) injection, ondansetron **OR** ondansetron (ZOFRAN) IV,  oxyCODONE, polyethylene glycol  Antimicrobials: Anti-infectives (From admission, onward)   Start     Dose/Rate Route Frequency Ordered Stop   05/11/20 1400  metroNIDAZOLE (FLAGYL) tablet 500 mg        500 mg Oral Every 8 hours 05/11/20 1129     05/08/20 1000  vancomycin (VANCOREADY) IVPB 750 mg/150 mL        750 mg 150 mL/hr over 60 Minutes Intravenous Every 12 hours 05/08/20 0213     05/08/20 0215  meropenem (MERREM) 1 g in sodium chloride 0.9 % 100 mL IVPB  Status:  Discontinued        1 g 200 mL/hr over 30 Minutes Intravenous Every 8 hours 05/08/20 0211 05/11/20 1129   05/08/20 0215  vancomycin (VANCOREADY) IVPB 1500 mg/300 mL        1,500 mg 150 mL/hr over 120 Minutes Intravenous  Once 05/08/20 0211 05/08/20 0507       I have personally reviewed the following labs and images: CBC: Recent Labs  Lab 05/07/20 2333 05/08/20 0447 05/09/20 0301 05/10/20 0136 05/11/20 0831  WBC 11.9* 9.6 9.8 8.7 6.5  NEUTROABS 8.6* 6.9  --   --   --   HGB 13.4  13.4 12.6* 11.3* 11.6*  HCT 41.7 40.2 37.5* 35.0* 35.7*  MCV 98.8 96.6 96.2 99.2 98.9  PLT 294 278 275 269 251   BMP &GFR Recent Labs  Lab 05/07/20 2333 05/08/20 0447 05/09/20 0301 05/10/20 0136 05/11/20 0831  NA 133* 135 135 133* 135  K 2.9* 3.0* 4.3 4.4 3.9  CL 97* 98 100 99 102  CO2 26 27 27 29 30   GLUCOSE 113* 102* 96 112* 169*  BUN 23 21 16 19 20   CREATININE 1.42* 1.25* 1.10 1.17 0.92  CALCIUM 8.5* 8.4* 8.5* 7.9* 8.0*  MG  --  1.8 1.5* 1.8 2.0  PHOS  --   --   --  3.1 2.0*   Estimated Creatinine Clearance: 77.6 mL/min (by C-G formula based on SCr of 0.92 mg/dL). Liver & Pancreas: Recent Labs  Lab 05/07/20 2333 05/08/20 0447 05/10/20 0136 05/11/20 0831  AST 18 17  --   --   ALT 12 12  --   --   ALKPHOS 60 49  --   --   BILITOT 0.7 0.9  --   --   PROT 8.3* 7.8  --   --   ALBUMIN 2.5* 2.3* 2.1* 2.0*   No results for input(s): LIPASE, AMYLASE in the last 168 hours. No results for input(s): AMMONIA in the last  168 hours. Diabetic: No results for input(s): HGBA1C in the last 72 hours. No results for input(s): GLUCAP in the last 168 hours. Cardiac Enzymes: No results for input(s): CKTOTAL, CKMB, CKMBINDEX, TROPONINI in the last 168 hours. No results for input(s): PROBNP in the last 8760 hours. Coagulation Profile: Recent Labs  Lab 05/07/20 2333 05/08/20 0447  INR 1.1 1.1   Thyroid Function Tests: No results for input(s): TSH, T4TOTAL, FREET4, T3FREE, THYROIDAB in the last 72 hours. Lipid Profile: No results for input(s): CHOL, HDL, LDLCALC, TRIG, CHOLHDL, LDLDIRECT in the last 72 hours. Anemia Panel: No results for input(s): VITAMINB12, FOLATE, FERRITIN, TIBC, IRON, RETICCTPCT in the last 72 hours. Urine analysis:    Component Value Date/Time   COLORURINE AMBER (A) 05/07/2020 2310   APPEARANCEUR HAZY (A) 05/07/2020 2310   LABSPEC 1.026 05/07/2020 2310   PHURINE 5.0 05/07/2020 2310   GLUCOSEU NEGATIVE 05/07/2020 2310   HGBUR NEGATIVE 05/07/2020 2310   BILIRUBINUR NEGATIVE 05/07/2020 2310   KETONESUR NEGATIVE 05/07/2020 2310   PROTEINUR 30 (A) 05/07/2020 2310   UROBILINOGEN 2.0 (H) 08/20/2014 2345   NITRITE NEGATIVE 05/07/2020 2310   LEUKOCYTESUR NEGATIVE 05/07/2020 2310   Sepsis Labs: Invalid input(s): PROCALCITONIN, [place]  Microbiology: Recent Results (from the past 240 hour(s))  Culture, blood (Routine x 2)     Status: None (Preliminary result)   Collection Time: 05/07/20 11:00 PM   Specimen: BLOOD LEFT ARM  Result Value Ref Range Status   Specimen Description BLOOD LEFT ARM  Final   Special Requests   Final    BOTTLES DRAWN AEROBIC AND ANAEROBIC Blood Culture adequate volume   Culture   Final    NO GROWTH 3 DAYS Performed at [facility], [address], [place], [place] [address]    Report Status PENDING  Incomplete  Culture, blood (Routine x 2)     Status: None (Preliminary result)   Collection Time: 05/07/20 11:27 PM   Specimen: BLOOD RIGHT ARM  Result  Value Ref Range Status   Specimen Description BLOOD RIGHT ARM  Final   Special Requests AEROBIC BOTTLE ONLY Blood Culture adequate volume  Final   Culture  Final    NO GROWTH 3 DAYS Performed at [facility], [place] [address], [place], [place] [address]    Report Status PENDING  Incomplete  Resp Panel by RT-PCR (Flu A&B, Covid) Nasopharyngeal Swab     Status: None   Collection Time: 05/08/20  1:38 AM   Specimen: Nasopharyngeal Swab; Nasopharyngeal(NP) swabs in vial transport medium  Result Value Ref Range Status   SARS Coronavirus 2 by RT PCR NEGATIVE NEGATIVE Final    Comment: (NOTE) SARS-CoV-2 target nucleic acids are NOT DETECTED.  The SARS-CoV-2 RNA is generally detectable in upper respiratory specimens during the acute phase of infection. The lowest concentration of SARS-CoV-2 viral copies this assay can detect is 138 copies/mL. A negative result does not preclude SARS-Cov-2 infection and should not be used as the sole basis for treatment or other patient management decisions. A negative result may occur with  improper specimen collection/handling, submission of specimen other than nasopharyngeal swab, presence of viral mutation(s) within the areas targeted by this assay, and inadequate number of viral copies(<138 copies/mL). A negative result must be combined with clinical observations, patient history, and epidemiological information. The expected result is Negative.  Fact Sheet for Patients:  EntrepreneurPulse.com.au  Fact Sheet for Healthcare Providers:  IncredibleEmployment.be  This test is no t yet approved or cleared by the [place] FDA and  has been authorized for detection and/or diagnosis of SARS-CoV-2 by FDA under an Emergency Use Authorization (EUA). This EUA will remain  in effect (meaning this test can be used) for the duration of the COVID-19 declaration under Section 564(b)(1) of the Act, 21 U.S.C.section  360bbb-3(b)(1), unless the authorization is terminated  or revoked sooner.       Influenza A by PCR NEGATIVE NEGATIVE Final   Influenza B by PCR NEGATIVE NEGATIVE Final    Comment: (NOTE) The Xpert Xpress SARS-CoV-2/FLU/RSV plus assay is intended as an aid in the diagnosis of influenza from Nasopharyngeal swab specimens and should not be used as a sole basis for treatment. Nasal washings and aspirates are unacceptable for Xpert Xpress SARS-CoV-2/FLU/RSV testing.  Fact Sheet for Patients: EntrepreneurPulse.com.au  Fact Sheet for Healthcare Providers: IncredibleEmployment.be  This test is not yet approved or cleared by the [place] FDA and has been authorized for detection and/or diagnosis of SARS-CoV-2 by FDA under an Emergency Use Authorization (EUA). This EUA will remain in effect (meaning this test can be used) for the duration of the COVID-19 declaration under Section 564(b)(1) of the Act, 21 U.S.C. section 360bbb-3(b)(1), unless the authorization is terminated or revoked.  Performed at [facility], [place] [address] [place], [place] [address]   Aerobic Culture w Gram Stain (superficial specimen)     Status: None   Collection Time: 05/08/20  4:54 AM   Specimen: Wound  Result Value Ref Range Status   Specimen Description WOUND  Final   Special Requests FOOT RIGHT  Final   Gram Stain   Final    FEW WBC PRESENT,BOTH PMN AND MONONUCLEAR FEW GRAM POSITIVE COCCI Performed at [facility], [address], [place], [place] [address]    Culture   Final    ABUNDANT METHICILLIN RESISTANT STAPHYLOCOCCUS AUREUS   Report Status 05/10/2020 FINAL  Final   Organism ID, Bacteria METHICILLIN RESISTANT STAPHYLOCOCCUS AUREUS  Final      Susceptibility   Methicillin resistant staphylococcus aureus - MIC*    CIPROFLOXACIN >=8 RESISTANT Resistant     ERYTHROMYCIN >=8 RESISTANT Resistant  GENTAMICIN <=0.5 SENSITIVE Sensitive      OXACILLIN >=4 RESISTANT Resistant     TETRACYCLINE >=16 RESISTANT Resistant     VANCOMYCIN <=0.5 SENSITIVE Sensitive     TRIMETH/SULFA >=320 RESISTANT Resistant     CLINDAMYCIN <=0.25 SENSITIVE Sensitive     RIFAMPIN <=0.5 SENSITIVE Sensitive     Inducible Clindamycin NEGATIVE Sensitive     * ABUNDANT METHICILLIN RESISTANT STAPHYLOCOCCUS AUREUS  MRSA PCR Screening     Status: Abnormal   Collection Time: 05/09/20  7:41 AM   Specimen: Nasal Mucosa; Nasopharyngeal  Result Value Ref Range Status   MRSA by PCR POSITIVE (A) NEGATIVE Final    Comment:        The GeneXpert MRSA Assay (FDA approved for NASAL specimens only), is one component of a comprehensive MRSA colonization surveillance program. It is not intended to diagnose MRSA infection nor to guide or monitor treatment for MRSA infections. RESULT CALLED TO, READ BACK BY AND VERIFIED WITH: RN MARISSA [phone] MLM Performed at [facility], [place] [address], [place], [place] [address]   Aerobic/Anaerobic Culture w Gram Stain (surgical/deep wound)     Status: None (Preliminary result)   Collection Time: 05/09/20  9:18 AM   Specimen: PATH Soft tissue  Result Value Ref Range Status   Specimen Description TISSUE  Final   Special Requests FOOT RIGHT  Final   Gram Stain   Final    NO WBC SEEN NO ORGANISMS SEEN Performed at [facility], [place] [address], [place], [place] [address]    Culture   Final    MODERATE METHICILLIN RESISTANT STAPHYLOCOCCUS AUREUS CRITICAL RESULT CALLED TO, READ BACK BY AND VERIFIED WITH: RN DOROTHY M. [phone] FCP NO ANAEROBES ISOLATED; CULTURE IN PROGRESS FOR 5 DAYS    Report Status PENDING  Incomplete   Organism ID, Bacteria METHICILLIN RESISTANT STAPHYLOCOCCUS AUREUS  Final      Susceptibility   Methicillin resistant staphylococcus aureus - MIC*    CIPROFLOXACIN >=8 RESISTANT Resistant     ERYTHROMYCIN >=8 RESISTANT Resistant     GENTAMICIN <=0.5 SENSITIVE Sensitive     OXACILLIN  >=4 RESISTANT Resistant     TETRACYCLINE >=16 RESISTANT Resistant     VANCOMYCIN <=0.5 SENSITIVE Sensitive     TRIMETH/SULFA >=320 RESISTANT Resistant     CLINDAMYCIN <=0.25 SENSITIVE Sensitive     RIFAMPIN <=0.5 SENSITIVE Sensitive     Inducible Clindamycin NEGATIVE Sensitive     * MODERATE METHICILLIN RESISTANT STAPHYLOCOCCUS AUREUS    Radiology Studies: No results found.    Amos Micheals T. [place]  If 7PM-7AM, please contact night-coverage [email or web address] 05/11/2020, 2:19 PM

## 2020-05-11 NOTE — Progress Notes (Signed)
Patient ID: Dylan Harrington, male   DOB: [birth year], 80 y.o.   MRN: [id number] Patient is postoperative day 2 excisional debridement abscess right heel.  Still only 25 cc in the wound VAC canister, initial cultures are positive for MRSA.  Surgical cultures are pending.  Anticipate discharge on oral antibiotics.

## 2020-05-11 NOTE — Plan of Care (Signed)
?  Problem: Clinical Measurements: ?Goal: Ability to maintain clinical measurements within normal limits will improve ?Outcome: Progressing ?Goal: Will remain free from infection ?Outcome: Progressing ?Goal: Diagnostic test results will improve ?Outcome: Progressing ?  ?

## 2020-05-11 NOTE — Progress Notes (Signed)
Physical Therapy Treatment Patient Details Name: Dylan Harrington MRN: [id number] DOB: 03/20/1940 Today's Date: 05/11/2020    History of Present Illness The pt is a 80 yo male presentingto ED on 3/24 with x1 week of R foot wound with drainage. Pt found to have infection , and is now s/p I &D of R foot abcess with placement of wound vac on 3/26. PMH includes: COPD, glaucoma, HTN, OSA, spine, knee, and shoulder surgery.    PT Comments    Pt supine in bed on arrival.  Pt more alert and seems as if he does not remember last session or precautions.   Re-educated on precautions and weight bearing status and he participated well in todays session.  Plan remains appropriate for return home with follow up HHPT.  Plan for stair training next session.      Follow Up Recommendations  Home health PT;Supervision for mobility/OOB     Equipment Recommendations  Rolling walker with 5" wheels    Recommendations for Other Services       Precautions / Restrictions Precautions Precautions: Fall Precaution Comments: wound vac Required Braces or Orthoses: Other Brace Other Brace: post-op shoe Restrictions Weight Bearing Restrictions: Yes RLE Weight Bearing: Non weight bearing    Mobility  Bed Mobility Overal bed mobility: Needs Assistance Bed Mobility: Rolling;Sidelying to Sit Rolling: Supervision Sidelying to sit: Supervision;HOB elevated       General bed mobility comments: supervision for line management and to avoid use of RLE, pt able to complete without assist, used bed rail and elevated HOB    Transfers Overall transfer level: Needs assistance Equipment used: Rolling walker (2 wheeled) Transfers: Sit to/from Stand Sit to Stand: Min guard         General transfer comment: Cues for hand placement and weight bearing.  He demonstrated improved carryover this session and maintaining non weight bearing well this session.  Ambulation/Gait Ambulation/Gait assistance: Min assist Gait  Distance (Feet): 6 Feet Assistive device: Rolling walker (2 wheeled) Gait Pattern/deviations: Step-to pattern;Trunk flexed (hop to pattern)     General Gait Details: Cues for RW safety and use of UEs to produce hop steps.   Stairs             Wheelchair Mobility    Modified Rankin (Stroke Patients Only)       Balance Overall balance assessment: Needs assistance Sitting-balance support: Feet supported;No upper extremity supported Sitting balance-Leahy Scale: Good       Standing balance-Leahy Scale: Poor                              Cognition Arousal/Alertness: Awake/alert Behavior During Therapy: WFL for tasks assessed/performed;Impulsive Overall Cognitive Status: Impaired/Different from baseline Area of Impairment: Following commands;Safety/judgement;Problem solving                       Following Commands: Follows one step commands with increased time Safety/Judgement: Decreased awareness of safety;Decreased awareness of deficits   Problem Solving: Slow processing;Difficulty sequencing;Requires verbal cues;Requires tactile cues General Comments: Required re-education on precautions and frequent cueing for safety.      Exercises General Exercises - Lower Extremity Long Arc Quad: AROM;Both;10 reps;Seated    General Comments        Pertinent Vitals/Pain Pain Assessment: Faces Faces Pain Scale: No hurt    Home Living  Prior Function            PT Goals (current goals can now be found in the care plan section) Acute Rehab PT Goals Patient Stated Goal: return home to wife Potential to Achieve Goals: Good Progress towards PT goals: Progressing toward goals    Frequency    Min 4X/week      PT Plan Current plan remains appropriate    Co-evaluation              AM-PAC PT "6 Clicks" Mobility   Outcome Measure  Help needed turning from your back to your side while in a flat bed without  using bedrails?: None Help needed moving from lying on your back to sitting on the side of a flat bed without using bedrails?: A Little Help needed moving to and from a bed to a chair (including a wheelchair)?: A Little Help needed standing up from a chair using your arms (e.g., wheelchair or bedside chair)?: A Little Help needed to walk in hospital room?: A Little Help needed climbing 3-5 steps with a railing? : A Little 6 Click Score: 19    End of Session Equipment Utilized During Treatment: Gait belt Activity Tolerance: Patient tolerated treatment well Patient left: with call bell/phone within reach;in chair;with chair alarm set Nurse Communication: Mobility status PT Visit Diagnosis: Unsteadiness on feet (R26.81);Other abnormalities of gait and mobility (R26.89);Muscle weakness (generalized) (M62.81)     Time: 1207-1228 PT Time Calculation (min) (ACUTE ONLY): 21 min  Charges:  $Therapeutic Activity: 8-22 mins                     Bonney Leitz , PTA Acute Rehabilitation Services Pager [phone] Office [phone]     Dylan Harrington Artis Delay 05/11/2020, 1:11 PM

## 2020-05-11 NOTE — Progress Notes (Signed)
Pt. Refused cpap for tonight. RT informed pt. To notify if he changes his mind.  

## 2020-05-11 NOTE — Care Management Important Message (Signed)
Important Message  Patient Details  Name: Dylan Harrington MRN: [id number] Date of Birth: 12-Oct-1940   Medicare Important Message Given:  Yes - Important Message mailed due to current National Emergency  Verbal consent obtained due to current National Emergency  Relationship to patient: Self Contact Name: Diyari Call Date: 05/11/20  Time: 1450 Phone: [phone] Outcome: Spoke with contact Important Message mailed to: Patient address on file    [name] 05/11/2020, 2:51 PM

## 2020-05-11 NOTE — Progress Notes (Signed)
Orthopedic Tech Progress Note Patient Details:  Dylan Harrington 1940/07/17 [id number] Was not sure if this was the right POST OP SHOE , but fitted patient with a REGULAR POST OP SHOE  Ortho Devices Type of Ortho Device: Postop shoe/boot Ortho Device/Splint Location: RLE Ortho Device/Splint Interventions: Ordered,Application   Post Interventions Patient Tolerated: Well Instructions Provided: Care of device   Donald Pore 05/11/2020, 8:46 AM

## 2020-05-11 NOTE — Consult Note (Signed)
[facility]                                                                                        Patient Identification: Patient Name: Dylan Harrington MRN: [id number] [place] Date: 05/07/2020 11:03 PM Today's Date: 05/11/2020 Reason for consult: DFU Requesting provider: Wendee Beavers   Principal Problem:   Cellulitis of right foot Active Problems:   COPD (chronic obstructive pulmonary disease) ([facility])   Essential hypertension   Skin ulcer of plantar aspect of foot (HCC)   Nicotine dependence, cigarettes, uncomplicated   BPH without urinary obstruction   Lactic acidosis   Hypokalemia   Cutaneous abscess of right foot   Protein-calorie malnutrition, severe   Foot abscess, right   Antibiotics: Vancomycin 3/24-c                    Meropenem 3/24-c  Lines/Tubes:PIVs,  Assessment Rt Foot Ulcer with deep abscess involving muscle and fascial layer. Xray with no evidence of Osteomyelitis. No MRI scan done. Given acute h/o onset of ulcer, less likely to have OM.  OR note with no mention of bony involvement.  Wound cx 3/15 growing MRSA 3/6 S/p Excisional debridement right foot. Local tissue rearrangement for wound closure 9 x 4 cm. Application of Prevena wound VAC and Penrose drain. Tissue cultures growing staph aureus ( pending )   PMK  Partial Knee Replacement  Medication Monitoring Vanc trough 12  Smoking - Smoked 1 pack a day for more than 50 years, counseled    Recommendations  Continue Vancomycin, pharmacy to dose while in the hospital  DC Meropenem. Add metronidazole for anaerobic coverage while cultures finalize  Monitor CBC, BMP and Vancomycin trough  Baseline ESR and CRP ( ordered) Following OR cultures  Will plan to do PO linezolid upon discharge   Rest of the management as per the primary team. Please call with questions or concerns.  Thank you for the  consult ________________________________________________________________________________________________________ HPI and Hospital Course: 80 year old male with past medical history of smoking, BPH, COPD, hypertension, DM and OSA who presented to the ED on 05/08/2020 with complaints of right foot pain and ulceration.  History obtained from patient as well as from chart review.  Roughly 9 to 10 days ago prior to admit, he stepped on something sharp while walking in his room which resulted in a small wound on the bottom of his right foot which was followed by severe pain with associated redness and swelling.  He went to see primary care provider who gave him course of Bactrim twice a day but unfortunately did not resolve his symptoms.  He continued to have progressive worsening of the pain, swelling and redness.  He denies fever; denies nausea vomiting abdominal pain and diarrhea.  He also started to have purulent drainage from his ulcer.  In the ED, he was afebrile, no leukocytosis.  Labs was remarkable for creatinine of 1.25, CRP 6.4 lactic acid 1.7.  Wound culture 3/25 grew MRSA.  X-ray right foot did not show osteomyelitis.  He underwent excisional debridement of his right foot with local tissue rearrangement for wound closure,  application of Prevena and Penrose drain.  Or tissue cultures was sent which grew staph aureus.  ROS: Patient seems to be drowsy and wanting to sleep.   Past Medical History:  Diagnosis Date  . COPD (chronic obstructive pulmonary disease) ([facility])   . Glaucoma   . Hypertension   . Obstructive sleep apnea 03/15/2016  . Pacemaker   . Renal disorder   . Sleep apnea    Past Surgical History:  Procedure Laterality Date  . I & D EXTREMITY Right 05/09/2020   Procedure: IRRIGATION AND DEBRIDEMENT FOOT;  Surgeon: Newt Minion, MD;  Location: [facility];  Service: Orthopedics;  Laterality: Right;  . KIDNEY STONE SURGERY    . MEDIAL PARTIAL KNEE REPLACEMENT    . PACEMAKER INSERTION     . SHOULDER SURGERY    . SPINE SURGERY    . stents       Scheduled Meds: . (feeding supplement) PROSource Plus  30 mL Oral BID BM  . Chlorhexidine Gluconate Cloth  6 each Topical Q0600  . docusate sodium  100 mg Oral BID  . enoxaparin (LOVENOX) injection  40 mg Subcutaneous Q24H  . feeding supplement  237 mL Oral TID BM  . gabapentin  300 mg Oral QHS  . guaiFENesin  600 mg Oral BID  . metoprolol tartrate  25 mg Oral Daily  . multivitamin with minerals  1 tablet Oral Daily  . mupirocin ointment  1 application Nasal BID  . tamsulosin  0.4 mg Oral Daily   Continuous Infusions: . sodium chloride 10 mL/hr at 05/10/20 1811  . lactated ringers 700 mL/hr at 05/09/20 0935  . meropenem (MERREM) IV 1 g (05/11/20 0117)  . methocarbamol (ROBAXIN) IV    . vancomycin 750 mg (05/10/20 2113)   PRN Meds:.acetaminophen **OR** acetaminophen, albuterol, bisacodyl, hydrALAZINE, HYDROmorphone (DILAUDID) injection, magnesium citrate, methocarbamol **OR** methocarbamol (ROBAXIN) IV, metoCLOPramide **OR** metoCLOPramide (REGLAN) injection, ondansetron **OR** ondansetron (ZOFRAN) IV, oxyCODONE, oxyCODONE, polyethylene glycol  Allergies  Allergen Reactions  . Keflex [Cephalexin] Hives, Rash and Other (See Comments)   Social History   Socioeconomic History  . Marital status: Married    Spouse name: Not on file  . Number of children: Not on file  . Years of education: Not on file  . Highest education level: Not on file  Occupational History  . Not on file  Tobacco Use  . Smoking status: Heavy Tobacco Smoker    Packs/day: 0.00    Types: Cigarettes  . Smokeless tobacco: Never Used  Substance and Sexual Activity  . Alcohol use: Yes  . Drug use: No  . Sexual activity: Not on file  Other Topics Concern  . Not on file  Social History Narrative  . Not on file   Social Determinants of Health   Financial Resource Strain: Not on file  Food Insecurity: Not on file  Transportation Needs: Not on  file  Physical Activity: Not on file  Stress: Not on file  Social Connections: Not on file  Intimate Partner Violence: Not on file    Vitals BP 125/67 (BP Location: Left Arm)   Pulse 76   Temp 98.7 F (37.1 C) (Oral)   Resp 16   Ht 6' (1.829 m)   Wt 94.3 kg   SpO2 93%   BMI 28.20 kg/m    Physical Exam Constitutional:  drowsy, on oxygen    Comments:   Cardiovascular:     Rate and Rhythm: Normal rate and regular rhythm.  Heart sounds: No murmur heard.   Pulmonary:     Effort: Pulmonary effort is normal.     Comments:   Abdominal:     Palpations: Abdomen is soft.     Tenderness: non tender   Musculoskeletal:        General: RT Foot is bandaged and left foot looks OK   Skin:    Comments: No obvious rashes   Neurological:     General: No focal deficit present.   Psychiatric:        Mood and Affect: AA0-3   Pertinent Microbiology Results for orders placed or performed during the hospital encounter of 05/07/20  Culture, blood (Routine x 2)     Status: None (Preliminary result)   Collection Time: 05/07/20 11:00 PM   Specimen: BLOOD LEFT ARM  Result Value Ref Range Status   Specimen Description BLOOD LEFT ARM  Final   Special Requests   Final    BOTTLES DRAWN AEROBIC AND ANAEROBIC Blood Culture adequate volume   Culture   Final    NO GROWTH 3 DAYS Performed at [facility], [place] [address] [place], [place] [address]    Report Status PENDING  Incomplete  Culture, blood (Routine x 2)     Status: None (Preliminary result)   Collection Time: 05/07/20 11:27 PM   Specimen: BLOOD RIGHT ARM  Result Value Ref Range Status   Specimen Description BLOOD RIGHT ARM  Final   Special Requests AEROBIC BOTTLE ONLY Blood Culture adequate volume  Final   Culture   Final    NO GROWTH 3 DAYS Performed at [facility], [place] [address], [place], [place] [address]    Report Status PENDING  Incomplete  Resp Panel by RT-PCR (Flu A&B, Covid) Nasopharyngeal Swab      Status: None   Collection Time: 05/08/20  1:38 AM   Specimen: Nasopharyngeal Swab; Nasopharyngeal(NP) swabs in vial transport medium  Result Value Ref Range Status   SARS Coronavirus 2 by RT PCR NEGATIVE NEGATIVE Final    Comment: (NOTE) SARS-CoV-2 target nucleic acids are NOT DETECTED.  The SARS-CoV-2 RNA is generally detectable in upper respiratory specimens during the acute phase of infection. The lowest concentration of SARS-CoV-2 viral copies this assay can detect is 138 copies/mL. A negative result does not preclude SARS-Cov-2 infection and should not be used as the sole basis for treatment or other patient management decisions. A negative result may occur with  improper specimen collection/handling, submission of specimen other than nasopharyngeal swab, presence of viral mutation(s) within the areas targeted by this assay, and inadequate number of viral copies(<138 copies/mL). A negative result must be combined with clinical observations, patient history, and epidemiological information. The expected result is Negative.  Fact Sheet for Patients:  EntrepreneurPulse.com.au  Fact Sheet for Healthcare Providers:  IncredibleEmployment.be  This test is no t yet approved or cleared by the [place] FDA and  has been authorized for detection and/or diagnosis of SARS-CoV-2 by FDA under an Emergency Use Authorization (EUA). This EUA will remain  in effect (meaning this test can be used) for the duration of the COVID-19 declaration under Section 564(b)(1) of the Act, 21 U.S.C.section 360bbb-3(b)(1), unless the authorization is terminated  or revoked sooner.       Influenza A by PCR NEGATIVE NEGATIVE Final   Influenza B by PCR NEGATIVE NEGATIVE Final    Comment: (NOTE) The Xpert Xpress SARS-CoV-2/FLU/RSV plus assay is intended as an aid in the diagnosis of influenza  from Nasopharyngeal swab specimens and should not be used as a sole basis  for treatment. Nasal washings and aspirates are unacceptable for Xpert Xpress SARS-CoV-2/FLU/RSV testing.  Fact Sheet for Patients: EntrepreneurPulse.com.au  Fact Sheet for Healthcare Providers: IncredibleEmployment.be  This test is not yet approved or cleared by the [place] FDA and has been authorized for detection and/or diagnosis of SARS-CoV-2 by FDA under an Emergency Use Authorization (EUA). This EUA will remain in effect (meaning this test can be used) for the duration of the COVID-19 declaration under Section 564(b)(1) of the Act, 21 U.S.C. section 360bbb-3(b)(1), unless the authorization is terminated or revoked.  Performed at [facility], [place] [address], [place], [place] [address]   Aerobic Culture w Gram Stain (superficial specimen)     Status: None   Collection Time: 05/08/20  4:54 AM   Specimen: Wound  Result Value Ref Range Status   Specimen Description WOUND  Final   Special Requests FOOT RIGHT  Final   Gram Stain   Final    FEW WBC PRESENT,BOTH PMN AND MONONUCLEAR FEW GRAM POSITIVE COCCI Performed at [facility], [address], [place], [place] [address]    Culture   Final    ABUNDANT METHICILLIN RESISTANT STAPHYLOCOCCUS AUREUS   Report Status 05/10/2020 FINAL  Final   Organism ID, Bacteria METHICILLIN RESISTANT STAPHYLOCOCCUS AUREUS  Final      Susceptibility   Methicillin resistant staphylococcus aureus - MIC*    CIPROFLOXACIN >=8 RESISTANT Resistant     ERYTHROMYCIN >=8 RESISTANT Resistant     GENTAMICIN <=0.5 SENSITIVE Sensitive     OXACILLIN >=4 RESISTANT Resistant     TETRACYCLINE >=16 RESISTANT Resistant     VANCOMYCIN <=0.5 SENSITIVE Sensitive     TRIMETH/SULFA >=320 RESISTANT Resistant     CLINDAMYCIN <=0.25 SENSITIVE Sensitive     RIFAMPIN <=0.5 SENSITIVE Sensitive     Inducible Clindamycin NEGATIVE Sensitive     * ABUNDANT METHICILLIN RESISTANT STAPHYLOCOCCUS AUREUS  MRSA PCR Screening      Status: Abnormal   Collection Time: 05/09/20  7:41 AM   Specimen: Nasal Mucosa; Nasopharyngeal  Result Value Ref Range Status   MRSA by PCR POSITIVE (A) NEGATIVE Final    Comment:        The GeneXpert MRSA Assay (FDA approved for NASAL specimens only), is one component of a comprehensive MRSA colonization surveillance program. It is not intended to diagnose MRSA infection nor to guide or monitor treatment for MRSA infections. RESULT CALLED TO, READ BACK BY AND VERIFIED WITH: RN MARISSA [id number] Performed at [facility], [place] [address] [place], [place] [address]   Aerobic/Anaerobic Culture w Gram Stain (surgical/deep wound)     Status: None (Preliminary result)   Collection Time: 05/09/20  9:18 AM   Specimen: PATH Soft tissue  Result Value Ref Range Status   Specimen Description TISSUE  Final   Special Requests FOOT RIGHT  Final   Gram Stain NO WBC SEEN NO ORGANISMS SEEN   Final   Culture   Final    MODERATE STAPHYLOCOCCUS AUREUS SUSCEPTIBILITIES TO FOLLOW CRITICAL RESULT CALLED TO, READ BACK BY AND VERIFIED WITH: RN Rogue Jury [id number] FCP Performed at [facility], [place] [address], [place],  [address]    Report Status PENDING  Incomplete      Pertinent Lab seen by me: CBC Latest Ref Rng & Units 05/11/2020 05/10/2020 05/09/2020  WBC 4.0 - 10.5 K/uL 6.5 8.7 9.8  Hemoglobin 13.0 - 17.0 g/dL 11.6(L) 11.3(L) 12.6(L)  Hematocrit 39.0 - 52.0 % 35.7(L) 35.0(L) 37.5(L)  Platelets 150 - 400 K/uL 251 269 275   CMP Latest Ref Rng & Units 05/11/2020 05/10/2020 05/09/2020  Glucose 70 - 99 mg/dL 169(H) 112(H) 96  BUN 8 - 23 mg/dL 20 19 16   Creatinine 0.61 - 1.24 mg/dL 0.92 1.17 1.10  Sodium 135 - 145 mmol/L 135 133(L) 135  Potassium 3.5 - 5.1 mmol/L 3.9 4.4 4.3  Chloride 98 - 111 mmol/L 102 99 100  CO2 22 - 32 mmol/L 30 29 27   Calcium 8.9 - 10.3 mg/dL 8.0(L) 7.9(L) 8.5(L)  Total Protein 6.5 - 8.1 g/dL - - -  Total Bilirubin 0.3 - 1.2 mg/dL - - -   Alkaline Phos 38 - 126 U/L - - -  AST 15 - 41 U/L - - -  ALT 0 - 44 U/L - - -     Pertinent Imagings/Other Imagings Plain films and CT images have been personally visualized and interpreted; radiology reports have been reviewed. Decision making incorporated into the Impression / Recommendations.  Rt Foot Xray 05/08/20  FINDINGS: There is no evidence of fracture or dislocation. 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 There is calcaneal enthesopathy. Focal area of ulceration on the mid plantar surface with overlying subcutaneous edema.  IMPRESSION: Area of ulceration on the plantar surface with overlying soft tissue swelling. No definite evidence of acute osteomyelitis.  I have spent 60 minutes for this patient encounter including review of prior medical records with greater than 50% of time being face to face and coordination of their care.  Electronically signed by:   Rosiland Oz, MD Infectious Disease Physician [facility] Pager: [phone]

## 2020-05-11 NOTE — Progress Notes (Signed)
Pharmacy Antibiotic Note  Dylan Harrington is a 80 y.o. male admitted on 05/07/2020 with wound infection after failing outpt therapy with TMP/SMX.  Pharmacy was consulted for vancomycin/meropenem dosing. Meropenem was discontinued today, and pt was started on PO metronidazole.  WBC 6.5; Scr 0.92 (improved since admission), CrCl 77.6 ml/min  3/29 AM update:  Vancomycin AUC is therapeutic at 419 Renal function stable  Plan: Cont vancomycin 750 mg IV q12 Re-check levels as needed  Height: 6' (182.9 cm) Weight: 94.3 kg (207 lb 14.3 oz) IBW/kg (Calculated) : 77.6  Temp (24hrs), Avg:98.5 F (36.9 C), Min:97.8 F (36.6 C), Max:99.4 F (37.4 C)  Recent Labs  Lab 05/07/20 2333 05/08/20 0140 05/08/20 0447 05/09/20 0301 05/10/20 0136 05/11/20 0831 05/11/20 1224 05/11/20 1612  WBC 11.9*  --  9.6 9.8 8.7 6.5  --   --   CREATININE 1.42*  --  1.25* 1.10 1.17 0.92  --   --   LATICACIDVEN 2.3* 1.7  --   --   --   --   --   --   VANCOTROUGH  --   --   --   --   --  12*  --   --   VANCOPEAK  --   --   --   --   --   --  10* 22*    Estimated Creatinine Clearance: 77.6 mL/min (by C-G formula based on SCr of 0.92 mg/dL).    Allergies  Allergen Reactions  . Keflex [Cephalexin] Hives, Rash and Other (See Comments)   Microbiology Results 3/25: COVID, flu A, flu B, HIV screen: negative 3/24 Bld cx X 2:  NGTD 3/25 Wound cx, R foot:  abundant MRSA 3/26 MRSA PCR: positive 3/26 Wound cx, R foot: moderate MRSA  [name], PharmD, BCPS Clinical Pharmacist Phone: [phone]

## 2020-05-12 DIAGNOSIS — L03115 Cellulitis of right lower limb: Secondary | ICD-10-CM | POA: Diagnosis not present

## 2020-05-12 LAB — SEDIMENTATION RATE: Sed Rate: 109 mm/hr — ABNORMAL HIGH (ref 0–16)

## 2020-05-12 LAB — C-REACTIVE PROTEIN: CRP: 6.9 mg/dL — ABNORMAL HIGH (ref <1.0)

## 2020-05-12 LAB — VANCOMYCIN, TROUGH: Vancomycin Tr: 12 ug/mL — ABNORMAL LOW (ref 15–20)

## 2020-05-12 MED ORDER — GUAIFENESIN ER 600 MG PO TB12: 600.0000 mg | ORAL_TABLET | Freq: Two times a day (BID) | ORAL | Status: DC | PRN

## 2020-05-12 NOTE — Progress Notes (Signed)
Patient is postop day 2 status post irrigation and debridement right foot.  He is comfortable.  Cultures consistent with MRSA.  Resistant to many oral antibiotics.  Discussed with Dr. Lajoyce Corners would feel that me adequately treated with clindamycin for 2 weeks will need 1 week follow-up in our office over the patient had a large abscess did not have osteomyelitis.  Currently 150 cc in the canister and wound VAC is functioning

## 2020-05-12 NOTE — Progress Notes (Signed)
2140 Pt refused his CPAP. Pt educated.

## 2020-05-12 NOTE — Progress Notes (Signed)
Dylan Harrington  ZOX:[id number] DOB: 1940/05/15 DOA: 05/07/2020 PCP: Winifred OliveKirkman, Paul, MD    Brief Narrative:  [phone] w/ a hx of COPD, OSA, HTN, BPH, tobacco abuse, and neuropathy who presented to the ER with worsening right foot pain after having stepped on sharp object 10 days prior resulting in a wound to the bottom of his foot.  He had progressive erythema and purulent drainage despite use of Bactrim in the outpatient setting per his PCP.  Following admission the patient was treated with IV vancomycin and meropenem.  He underwent excisional debridement and wound VAC placement per Dr. Lajoyce Cornersuda 3/26.  Superficial wound cultures revealed MRSA.  ID directing antibiotic therapy.  Consultants:  Orthopedic surgery ID  Code Status: FULL CODE  Antimicrobials:  Meropenem 3/24 > 3/28 Vancomycin 3/24 > Flagyl 3/28 >  DVT prophylaxis: Lovenox  Subjective: Afebrile.  Vital signs stable.  Resting comfortably in bed at the time of my visit.  Denies chest pain shortness of breath nausea or vomiting.  States pain is well controlled in his foot.  Assessment & Plan:  Right foot cellulitis/abscess -sepsis POA Status post surgical I&D 3/26 -continue vancomycin and added Flagyl as per ID recommendations -patient was administered a Tdap vaccine during this admission -plan to transition to linezolid at time of discharge -wound VAC remains in place and is being attended to by the orthopedic service  Acute kidney injury Resolved -holding diuretic and ACE inhibitor  Essential hypertension Continuing home beta-blocker -holding home diuretic and ACE inhibitor -blood pressure controlled  Chronic COPD without acute exacerbation -tobacco abuse Stable -counseled on absolute need to discontinue tobacco abuse and the fact that smoking will impair wound healing  OSA Continue nightly CPAP  BPH Continue usual Flomax dose  Peripheral neuropathy Continue Neurontin per home dose   Family Communication:  Status  is: Inpatient  Remains inpatient appropriate because:Inpatient level of care appropriate due to severity of illness   Dispo: The patient is from: Home              Anticipated d/c is to: Home              Patient currently is not medically stable to d/c.   Difficult to place patient No   Objective: Blood pressure 124/74, pulse 90, temperature 99.1 F (37.3 C), temperature source Oral, resp. rate 17, height 6' (1.829 m), weight 94.3 kg, SpO2 95 %. No intake or output data in the 24 hours ending 05/12/20 1027 Filed Weights   05/07/20 2308 05/08/20 1355  Weight: 94.3 kg 94.3 kg    Examination: General: No acute respiratory distress Lungs: Clear to auscultation bilaterally without wheezes or crackles Cardiovascular: Regular rate and rhythm without murmur gallop or rub normal S1 and S2 Abdomen: Nontender, nondistended, soft, bowel sounds positive, no rebound, no ascites, no appreciable mass Extremities: No significant cyanosis, clubbing, or edema bilateral lower extremities -surgical wound dressed and dry with VAC therapy in place  CBC: Recent Labs  Lab 05/07/20 2333 05/08/20 0447 05/09/20 0301 05/10/20 0136 05/11/20 0831  WBC 11.9* 9.6 9.8 8.7 6.5  NEUTROABS 8.6* 6.9  --   --   --   HGB 13.4 13.4 12.6* 11.3* 11.6*  HCT 41.7 40.2 37.5* 35.0* 35.7*  MCV 98.8 96.6 96.2 99.2 98.9  PLT 294 278 275 269 251   Basic Metabolic Panel: Recent Labs  Lab 05/09/20 0301 05/10/20 0136 05/11/20 0831  NA 135 133* 135  K 4.3 4.4 3.9  CL 100 99 102  CO2 27 29 30   GLUCOSE 96 112* 169*  BUN 16 19 20   CREATININE 1.10 1.17 0.92  CALCIUM 8.5* 7.9* 8.0*  MG 1.5* 1.8 2.0  PHOS  --  3.1 2.0*   GFR: Estimated Creatinine Clearance: 77.6 mL/min (by C-G formula based on SCr of 0.92 mg/dL).  Liver Function Tests: Recent Labs  Lab 05/07/20 2333 05/08/20 0447 05/10/20 0136 05/11/20 0831  AST 18 17  --   --   ALT 12 12  --   --   ALKPHOS 60 49  --   --   BILITOT 0.7 0.9  --   --    PROT 8.3* 7.8  --   --   ALBUMIN 2.5* 2.3* 2.1* 2.0*    Coagulation Profile: Recent Labs  Lab 05/07/20 2333 05/08/20 0447  INR 1.1 1.1    HbA1C: Hgb A1c MFr Bld  Date/Time Value Ref Range Status  05/08/2020 04:47 AM 6.1 (H) 4.8 - 5.6 % Final    Comment:    (NOTE) Pre diabetes:          5.7%-6.4%  Diabetes:              >6.4%  Glycemic control for   <7.0% adults with diabetes     CBG: No results for input(s): GLUCAP in the last 168 hours.  Recent Results (from the past 240 hour(s))  Culture, blood (Routine x 2)     Status: None (Preliminary result)   Collection Time: 05/07/20 11:00 PM   Specimen: BLOOD LEFT ARM  Result Value Ref Range Status   Specimen Description BLOOD LEFT ARM  Final   Special Requests   Final    BOTTLES DRAWN AEROBIC AND ANAEROBIC Blood Culture adequate volume   Culture   Final    NO GROWTH 4 DAYS Performed at [facility], [address] [place], [address] [place]    Report Status PENDING  Incomplete  Culture, blood (Routine x 2)     Status: None (Preliminary result)   Collection Time: 05/07/20 11:27 PM   Specimen: BLOOD RIGHT ARM  Result Value Ref Range Status   Specimen Description BLOOD RIGHT ARM  Final   Special Requests AEROBIC BOTTLE ONLY Blood Culture adequate volume  Final   Culture   Final    NO GROWTH 4 DAYS Performed at [facility], [address] [place], [address] [place]    Report Status PENDING  Incomplete  Resp Panel by RT-PCR (Flu A&B, Covid) Nasopharyngeal Swab     Status: None   Collection Time: 05/08/20  1:38 AM   Specimen: Nasopharyngeal Swab; Nasopharyngeal(NP) swabs in vial transport medium  Result Value Ref Range Status   SARS Coronavirus 2 by RT PCR NEGATIVE NEGATIVE Final    Comment: (NOTE) SARS-CoV-2 target nucleic acids are NOT DETECTED.  The SARS-CoV-2 RNA is generally detectable in upper respiratory specimens during the acute phase of infection. The lowest concentration of SARS-CoV-2  viral copies this assay can detect is 138 copies/mL. A negative result does not preclude SARS-Cov-2 infection and should not be used as the sole basis for treatment or other patient management decisions. A negative result may occur with  improper specimen collection/handling, submission of specimen other than nasopharyngeal swab, presence of viral mutation(s) within the areas targeted by this assay, and inadequate number of viral copies(<138 copies/mL). A negative result must be combined with clinical observations, patient history, and epidemiological information. The expected result is Negative.  Fact Sheet for Patients:  BloggerCourse.com  Fact Sheet for Healthcare Providers:  SeriousBroker.it  This test is no t yet approved or cleared by the [place] FDA and  has been authorized for detection and/or diagnosis of SARS-CoV-2 by FDA under an Emergency Use Authorization (EUA). This EUA will remain  in effect (meaning this test can be used) for the duration of the COVID-19 declaration under Section 564(b)(1) of the Act, 21 U.S.C.section 360bbb-3(b)(1), unless the authorization is terminated  or revoked sooner.       Influenza A by PCR NEGATIVE NEGATIVE Final   Influenza B by PCR NEGATIVE NEGATIVE Final    Comment: (NOTE) The Xpert Xpress SARS-CoV-2/FLU/RSV plus assay is intended as an aid in the diagnosis of influenza from Nasopharyngeal swab specimens and should not be used as a sole basis for treatment. Nasal washings and aspirates are unacceptable for Xpert Xpress SARS-CoV-2/FLU/RSV testing.  Fact Sheet for Patients: BloggerCourse.com  Fact Sheet for Healthcare Providers: SeriousBroker.it  This test is not yet approved or cleared by the [place] FDA and has been authorized for detection and/or diagnosis of SARS-CoV-2 by FDA under an Emergency Use Authorization  (EUA). This EUA will remain in effect (meaning this test can be used) for the duration of the COVID-19 declaration under Section 564(b)(1) of the Act, 21 U.S.C. section 360bbb-3(b)(1), unless the authorization is terminated or revoked.  Performed at [facility], [address], [place], [place] [address]   Aerobic Culture w Gram Stain (superficial specimen)     Status: None   Collection Time: 05/08/20  4:54 AM   Specimen: Wound  Result Value Ref Range Status   Specimen Description WOUND  Final   Special Requests FOOT RIGHT  Final   Gram Stain   Final    FEW WBC PRESENT,BOTH PMN AND MONONUCLEAR FEW GRAM POSITIVE COCCI Performed at [facility] Lab, [address], [place], [place] [address]    Culture   Final    ABUNDANT METHICILLIN RESISTANT STAPHYLOCOCCUS AUREUS   Report Status 05/10/2020 FINAL  Final   Organism ID, Bacteria METHICILLIN RESISTANT STAPHYLOCOCCUS AUREUS  Final      Susceptibility   Methicillin resistant staphylococcus aureus - MIC*    CIPROFLOXACIN >=8 RESISTANT Resistant     ERYTHROMYCIN >=8 RESISTANT Resistant     GENTAMICIN <=0.5 SENSITIVE Sensitive     OXACILLIN >=4 RESISTANT Resistant     TETRACYCLINE >=16 RESISTANT Resistant     VANCOMYCIN <=0.5 SENSITIVE Sensitive     TRIMETH/SULFA >=320 RESISTANT Resistant     CLINDAMYCIN <=0.25 SENSITIVE Sensitive     RIFAMPIN <=0.5 SENSITIVE Sensitive     Inducible Clindamycin NEGATIVE Sensitive     * ABUNDANT METHICILLIN RESISTANT STAPHYLOCOCCUS AUREUS  MRSA PCR Screening     Status: Abnormal   Collection Time: 05/09/20  7:41 AM   Specimen: Nasal Mucosa; Nasopharyngeal  Result Value Ref Range Status   MRSA by PCR POSITIVE (A) NEGATIVE Final    Comment:        The GeneXpert MRSA Assay (FDA approved for NASAL specimens only), is one component of a comprehensive MRSA colonization surveillance program. It is not intended to diagnose MRSA infection nor to guide or monitor treatment for MRSA  infections. RESULT CALLED TO, READ BACK BY AND VERIFIED WITH: RN MARISSA [phone] MLM Performed at [facility], [address] [place], [place] [address]   Aerobic/Anaerobic Culture w Gram Stain (surgical/deep wound)     Status: None (Preliminary result)   Collection Time:  05/09/20  9:18 AM   Specimen: PATH Soft tissue  Result Value Ref Range Status   Specimen Description TISSUE  Final   Special Requests FOOT RIGHT  Final   Gram Stain   Final    NO WBC SEEN NO ORGANISMS SEEN Performed at [facility], [address], [place], [place] [address]    Culture   Final    MODERATE METHICILLIN RESISTANT STAPHYLOCOCCUS AUREUS CRITICAL RESULT CALLED TO, READ BACK BY AND VERIFIED WITH: RN DOROTHY M. [phone] FCP NO ANAEROBES ISOLATED; CULTURE IN PROGRESS FOR 5 DAYS    Report Status PENDING  Incomplete   Organism ID, Bacteria METHICILLIN RESISTANT STAPHYLOCOCCUS AUREUS  Final      Susceptibility   Methicillin resistant staphylococcus aureus - MIC*    CIPROFLOXACIN >=8 RESISTANT Resistant     ERYTHROMYCIN >=8 RESISTANT Resistant     GENTAMICIN <=0.5 SENSITIVE Sensitive     OXACILLIN >=4 RESISTANT Resistant     TETRACYCLINE >=16 RESISTANT Resistant     VANCOMYCIN <=0.5 SENSITIVE Sensitive     TRIMETH/SULFA >=320 RESISTANT Resistant     CLINDAMYCIN <=0.25 SENSITIVE Sensitive     RIFAMPIN <=0.5 SENSITIVE Sensitive     Inducible Clindamycin NEGATIVE Sensitive     * MODERATE METHICILLIN RESISTANT STAPHYLOCOCCUS AUREUS     Scheduled Meds: . (feeding supplement) PROSource Plus  30 mL Oral BID BM  . Chlorhexidine Gluconate Cloth  6 each Topical Q0600  . docusate sodium  100 mg Oral BID  . enoxaparin (LOVENOX) injection  40 mg Subcutaneous Q24H  . feeding supplement  237 mL Oral TID BM  . gabapentin  300 mg Oral QHS  . guaiFENesin  600 mg Oral BID  . metoprolol tartrate  25 mg Oral Daily  . metroNIDAZOLE  500 mg Oral Q8H  . multivitamin with minerals  1 tablet Oral  Daily  . mupirocin ointment  1 application Nasal BID  . tamsulosin  0.4 mg Oral Daily   Continuous Infusions: . sodium chloride 10 mL/hr at 05/10/20 1811  . lactated ringers 700 mL/hr at 05/09/20 0935  . methocarbamol (ROBAXIN) IV    . vancomycin 750 mg (05/12/20 0342)     LOS: 4 days   Lonia Blood, MD [facility] Office  [phone] Pager - Text Page per Amion  If 7PM-7AM, please contact night-coverage per Amion 05/12/2020, 10:27 AM

## 2020-05-12 NOTE — Progress Notes (Addendum)
[facility] Infectious Diseases Follow Up Note  Patient Identification: Patient Name: Dylan Harrington MRN: [id number] Admit Date: 05/07/2020 11:03 PM Age: 80 y.o.Today's Date: 05/12/2020   Reason for Visit: Cellulitis of foot  Principal Problem:   Cellulitis of right foot Active Problems:   COPD (chronic obstructive pulmonary disease) (HCC)   Essential hypertension   Skin ulcer of plantar aspect of foot (HCC)   Nicotine dependence, cigarettes, uncomplicated   BPH without urinary obstruction   Lactic acidosis   Hypokalemia   Cutaneous abscess of right foot   Protein-calorie malnutrition, severe   Foot abscess, right  Antibiotics: Vancomycin 3/24-c                    Meropenem 3/24-c  Lines/Tubes:PIVs  Interval Events: Remains afebrile, no leukocytosis hemodynamically stable.  OR cultures growing MRSA resistant to multiple oral antibiotics   Assessment Right foot ulcer with deep abscess involving muscle and fascial layer.  Status post excisional debridement of right foot and wound closure with VAC and Penrose drain on 3/6.  Tissue cultures growing MRSA.  No bony involvement per Ortho and x-ray.  Recommendations Continue vancomycin, pharmacy to dose while in the hospital.  Can switch to linezolid 600 mg p.o. twice daily for 2 weeks when ready to go home, possibly tomorrow per Primary  Wound Vac management per Ortho.  A follow-up with our [facility] has been made on May 26, 2020 at 3:45 PM with myself.  Rest of the management as per the primary team. Thank you for the consult. Please page with pertinent questions or concerns.  ______________________________________________________________________ Subjective patient seen and examined at the bedside.    Vitals BP 124/74 (BP Location: Left Arm)   Pulse 90   Temp 99.1 F (37.3 C) (Oral)   Resp 17   Ht 6' (1.829 m)   Wt 94.3 kg   SpO2 95%   BMI 28.20 kg/m      Physical Exam Constitutional:   Not in acute distress, able to follow commands but sleepy    Comments:   Cardiovascular:     Rate and Rhythm: Normal rate and regular rhythm.     Heart sounds:   Pulmonary:     Effort: Pulmonary effort is normal.     Comments:   Abdominal:     Palpations: Abdomen is soft.     Tenderness:   Musculoskeletal:        General: No swelling or tenderness.  Right foot is wrapped and has a wound VAC  Skin:    Comments: No obvious rashes  Neurological:     General: No focal deficit present.   Psychiatric:        Mood and Affect: Mood normal.    Pertinent Microbiology Results for orders placed or performed during the hospital encounter of 05/07/20  Culture, blood (Routine x 2)     Status: None (Preliminary result)   Collection Time: 05/07/20 11:00 PM   Specimen: BLOOD LEFT ARM  Result Value Ref Range Status   Specimen Description BLOOD LEFT ARM  Final   Special Requests   Final    BOTTLES DRAWN AEROBIC AND ANAEROBIC Blood Culture adequate volume   Culture   Final    NO GROWTH 3 DAYS Performed at [facility], [address], [place], [place] [address]    Report Status PENDING  Incomplete  Culture, blood (Routine x 2)     Status: None (Preliminary result)   Collection Time: 05/07/20 11:27  PM   Specimen: BLOOD RIGHT ARM  Result Value Ref Range Status   Specimen Description BLOOD RIGHT ARM  Final   Special Requests AEROBIC BOTTLE ONLY Blood Culture adequate volume  Final   Culture   Final    NO GROWTH 3 DAYS Performed at [facility], [address], [place], [place] [address]    Report Status PENDING  Incomplete  Resp Panel by RT-PCR (Flu A&B, Covid) Nasopharyngeal Swab     Status: None   Collection Time: 05/08/20  1:38 AM   Specimen: Nasopharyngeal Swab; Nasopharyngeal(NP) swabs in vial transport medium  Result Value Ref Range Status   SARS Coronavirus 2 by RT PCR NEGATIVE NEGATIVE Final    Comment: (NOTE) SARS-CoV-2 target  nucleic acids are NOT DETECTED.  The SARS-CoV-2 RNA is generally detectable in upper respiratory specimens during the acute phase of infection. The lowest concentration of SARS-CoV-2 viral copies this assay can detect is 138 copies/mL. A negative result does not preclude SARS-Cov-2 infection and should not be used as the sole basis for treatment or other patient management decisions. A negative result may occur with  improper specimen collection/handling, submission of specimen other than nasopharyngeal swab, presence of viral mutation(s) within the areas targeted by this assay, and inadequate number of viral copies(<138 copies/mL). A negative result must be combined with clinical observations, patient history, and epidemiological information. The expected result is Negative.  Fact Sheet for Patients:  BloggerCourse.com  Fact Sheet for Healthcare Providers:  SeriousBroker.it  This test is no t yet approved or cleared by the [place] FDA and  has been authorized for detection and/or diagnosis of SARS-CoV-2 by FDA under an Emergency Use Authorization (EUA). This EUA will remain  in effect (meaning this test can be used) for the duration of the COVID-19 declaration under Section 564(b)(1) of the Act, 21 U.S.C.section 360bbb-3(b)(1), unless the authorization is terminated  or revoked sooner.       Influenza A by PCR NEGATIVE NEGATIVE Final   Influenza B by PCR NEGATIVE NEGATIVE Final    Comment: (NOTE) The Xpert Xpress SARS-CoV-2/FLU/RSV plus assay is intended as an aid in the diagnosis of influenza from Nasopharyngeal swab specimens and should not be used as a sole basis for treatment. Nasal washings and aspirates are unacceptable for Xpert Xpress SARS-CoV-2/FLU/RSV testing.  Fact Sheet for Patients: BloggerCourse.com  Fact Sheet for Healthcare  Providers: SeriousBroker.it  This test is not yet approved or cleared by the [place] FDA and has been authorized for detection and/or diagnosis of SARS-CoV-2 by FDA under an Emergency Use Authorization (EUA). This EUA will remain in effect (meaning this test can be used) for the duration of the COVID-19 declaration under Section 564(b)(1) of the Act, 21 U.S.C. section 360bbb-3(b)(1), unless the authorization is terminated or revoked.  Performed at [facility], [address], [place], [place] [address]   Aerobic Culture w Gram Stain (superficial specimen)     Status: None   Collection Time: 05/08/20  4:54 AM   Specimen: Wound  Result Value Ref Range Status   Specimen Description WOUND  Final   Special Requests FOOT RIGHT  Final   Gram Stain   Final    FEW WBC PRESENT,BOTH PMN AND MONONUCLEAR FEW GRAM POSITIVE COCCI Performed at [facility], [address], [place], [place] [address]    Culture   Final    ABUNDANT METHICILLIN RESISTANT STAPHYLOCOCCUS AUREUS   Report Status 05/10/2020 FINAL  Final  Organism ID, Bacteria METHICILLIN RESISTANT STAPHYLOCOCCUS AUREUS  Final      Susceptibility   Methicillin resistant staphylococcus aureus - MIC*    CIPROFLOXACIN >=8 RESISTANT Resistant     ERYTHROMYCIN >=8 RESISTANT Resistant     GENTAMICIN <=0.5 SENSITIVE Sensitive     OXACILLIN >=4 RESISTANT Resistant     TETRACYCLINE >=16 RESISTANT Resistant     VANCOMYCIN <=0.5 SENSITIVE Sensitive     TRIMETH/SULFA >=320 RESISTANT Resistant     CLINDAMYCIN <=0.25 SENSITIVE Sensitive     RIFAMPIN <=0.5 SENSITIVE Sensitive     Inducible Clindamycin NEGATIVE Sensitive     * ABUNDANT METHICILLIN RESISTANT STAPHYLOCOCCUS AUREUS  MRSA PCR Screening     Status: Abnormal   Collection Time: 05/09/20  7:41 AM   Specimen: Nasal Mucosa; Nasopharyngeal  Result Value Ref Range Status   MRSA by PCR POSITIVE (A) NEGATIVE Final    Comment:        The  GeneXpert MRSA Assay (FDA approved for NASAL specimens only), is one component of a comprehensive MRSA colonization surveillance program. It is not intended to diagnose MRSA infection nor to guide or monitor treatment for MRSA infections. RESULT CALLED TO, READ BACK BY AND VERIFIED WITH: RN MARISSA [phone] MLM Performed at [facility], [address] [place], [place] [address]   Aerobic/Anaerobic Culture w Gram Stain (surgical/deep wound)     Status: None (Preliminary result)   Collection Time: 05/09/20  9:18 AM   Specimen: PATH Soft tissue  Result Value Ref Range Status   Specimen Description TISSUE  Final   Special Requests FOOT RIGHT  Final   Gram Stain   Final    NO WBC SEEN NO ORGANISMS SEEN Performed at [facility], [address] [place], [place] [address]    Culture   Final    MODERATE METHICILLIN RESISTANT STAPHYLOCOCCUS AUREUS CRITICAL RESULT CALLED TO, READ BACK BY AND VERIFIED WITH: RN DOROTHY M. [phone] FCP NO ANAEROBES ISOLATED; CULTURE IN PROGRESS FOR 5 DAYS    Report Status PENDING  Incomplete   Organism ID, Bacteria METHICILLIN RESISTANT STAPHYLOCOCCUS AUREUS  Final      Susceptibility   Methicillin resistant staphylococcus aureus - MIC*    CIPROFLOXACIN >=8 RESISTANT Resistant     ERYTHROMYCIN >=8 RESISTANT Resistant     GENTAMICIN <=0.5 SENSITIVE Sensitive     OXACILLIN >=4 RESISTANT Resistant     TETRACYCLINE >=16 RESISTANT Resistant     VANCOMYCIN <=0.5 SENSITIVE Sensitive     TRIMETH/SULFA >=320 RESISTANT Resistant     CLINDAMYCIN <=0.25 SENSITIVE Sensitive     RIFAMPIN <=0.5 SENSITIVE Sensitive     Inducible Clindamycin NEGATIVE Sensitive     * MODERATE METHICILLIN RESISTANT STAPHYLOCOCCUS AUREUS   Pertinent Lab. CBC Latest Ref Rng & Units 05/11/2020 05/10/2020 05/09/2020  WBC 4.0 - 10.5 K/uL 6.5 8.7 9.8  Hemoglobin 13.0 - 17.0 g/dL 11.6(L) 11.3(L) 12.6(L)  Hematocrit 39.0 - 52.0 % 35.7(L) 35.0(L) 37.5(L)  Platelets 150 -  400 K/uL 251 269 275   CMP Latest Ref Rng & Units 05/11/2020 05/10/2020 05/09/2020  Glucose 70 - 99 mg/dL 784(O169(H) 962(X112(H) 96  BUN 8 - 23 mg/dL 20 19 16   Creatinine 0.61 - 1.24 mg/dL 5.280.92 4.131.17 2.441.10  Sodium 135 - 145 mmol/L 135 133(L) 135  Potassium 3.5 - 5.1 mmol/L 3.9 4.4 4.3  Chloride 98 - 111 mmol/L 102 99 100  CO2 22 - 32 mmol/L 30 29 27   Calcium 8.9 - 10.3 mg/dL  8.0(L) 7.9(L) 8.5(L)  Total Protein 6.5 - 8.1 g/dL - - -  Total Bilirubin 0.3 - 1.2 mg/dL - - -  Alkaline Phos 38 - 126 U/L - - -  AST 15 - 41 U/L - - -  ALT 0 - 44 U/L - - -     Pertinent Imaging today Plain films and CT images have been personally visualized and interpreted; radiology reports have been reviewed. Decision making incorporated into the Impression / Recommendations.  I have spent approx 30 minutes for this patient encounter including review of prior medical records, coordination of care  with greater than 50% of time being face to face/counseling and discussing diagnostics/treatment plan with the patient/family.  Electronically signed by:   Odette Fraction, MD Infectious Disease Physician [facility] for Infectious Disease Pager: [phone]

## 2020-05-12 NOTE — Progress Notes (Signed)
Physical Therapy Treatment Patient Details Name: Dylan Harrington MRN: [id number] DOB: 01/19/1941 Today's Date: 05/12/2020    History of Present Illness The pt is a 80 yo male presentingto ED on 3/24 with x1 week of R foot wound with drainage. Pt found to have infection , and is now s/p I &D of R foot abcess with placement of wound vac on 3/26. PMH includes: COPD, glaucoma, HTN, OSA, spine, knee, and shoulder surgery.    PT Comments    Pt supine in bed on arrival this session.  Pt more fatigued and required increased assistance with noted increased WOB.  Pt presents with poor ability to maintain weight bearing as well.  Based on slow progress will update recommendations to SNF at this time.  Will inform supervising PT of need for change in recommendations at this time.      Follow Up Recommendations  SNF;Supervision for mobility/OOB (If he refuses SNF will require HHPT, OT and aide.)     Equipment Recommendations  Rolling walker with 5" wheels;Wheelchair cushion (measurements PT);Wheelchair (measurements PT);3in1 (PT)    Recommendations for Other Services       Precautions / Restrictions Precautions Precautions: Fall Precaution Comments: wound vac Required Braces or Orthoses: Other Brace Other Brace: post-op shoe Restrictions Weight Bearing Restrictions: Yes RLE Weight Bearing: Non weight bearing    Mobility  Bed Mobility Overal bed mobility: Needs Assistance Bed Mobility: Rolling;Sidelying to Sit Rolling: Supervision Sidelying to sit: Supervision;HOB elevated       General bed mobility comments: supervision for line management and to avoid use of RLE, pt able to complete without assist, used bed rail and elevated HOB    Transfers Overall transfer level: Needs assistance Equipment used: Rolling walker (2 wheeled) Transfers: Sit to/from Stand Sit to Stand: Min assist         General transfer comment: Cues for hand placement and weight bearing.  Increased time to  rise into standing this session.  Ambulation/Gait Ambulation/Gait assistance: Min assist;Mod assist Gait Distance (Feet): 6 Feet (+ 4 ft) Assistive device: Rolling walker (2 wheeled) Gait Pattern/deviations: Step-to pattern;Trunk flexed     General Gait Details: Cues for RW safety and use of UEs to produce hop steps.  Pt pushing device too far forward and flexing hips loosing control of RW.  He fatigues quickly after short bouts of hop to steps.  Close chair follow for safety.   Stairs             Wheelchair Mobility    Modified Rankin (Stroke Patients Only)       Balance     Sitting balance-Leahy Scale: Good       Standing balance-Leahy Scale: Poor                              Cognition Arousal/Alertness: Awake/alert Behavior During Therapy: WFL for tasks assessed/performed;Impulsive Overall Cognitive Status: Impaired/Different from baseline Area of Impairment: Following commands;Safety/judgement;Problem solving                     Memory: Decreased recall of precautions;Decreased short-term memory Following Commands: Follows one step commands with increased time Safety/Judgement: Decreased awareness of safety;Decreased awareness of deficits   Problem Solving: Slow processing;Difficulty sequencing;Requires verbal cues;Requires tactile cues General Comments: Required re-education on precautions and frequent cueing for safety.      Exercises General Exercises - Lower Extremity Long Arc Quad: AROM;Both;10 reps;Seated Hip Flexion/Marching: AROM;Both;10 reps;Seated  General Comments        Pertinent Vitals/Pain Pain Assessment: Faces Faces Pain Scale: Hurts little more Pain Location: R foot. Pain Descriptors / Indicators: Discomfort;Grimacing Pain Intervention(s): Monitored during session;Repositioned    Home Living                      Prior Function            PT Goals (current goals can now be found in the care  plan section) Acute Rehab PT Goals Patient Stated Goal: return home to wife Potential to Achieve Goals: Fair Progress towards PT goals: Progressing toward goals    Frequency    Min 4X/week (will keep 4x/week until snf is confirmed.)      PT Plan Current plan remains appropriate    Co-evaluation              AM-PAC PT "6 Clicks" Mobility   Outcome Measure  Help needed turning from your back to your side while in a flat bed without using bedrails?: None Help needed moving from lying on your back to sitting on the side of a flat bed without using bedrails?: A Little Help needed moving to and from a bed to a chair (including a wheelchair)?: A Little Help needed standing up from a chair using your arms (e.g., wheelchair or bedside chair)?: A Little Help needed to walk in hospital room?: A Lot Help needed climbing 3-5 steps with a railing? : A Lot 6 Click Score: 17    End of Session Equipment Utilized During Treatment: Gait belt Activity Tolerance: Patient tolerated treatment well Patient left: with call bell/phone within reach;in chair;with chair alarm set Nurse Communication: Mobility status PT Visit Diagnosis: Unsteadiness on feet (R26.81);Other abnormalities of gait and mobility (R26.89);Muscle weakness (generalized) (M62.81)     Time: 9163-8466 PT Time Calculation (min) (ACUTE ONLY): 13 min  Charges:  $Therapeutic Activity: 8-22 mins                     Bonney Leitz , PTA Acute Rehabilitation Services Pager [phone] Office [phone]     Ajeenah Heiny Artis Delay 05/12/2020, 4:55 PM

## 2020-05-12 NOTE — Progress Notes (Signed)
Patient refused CPAP. Informed patient to call if he changed his mind.

## 2020-05-13 ENCOUNTER — Inpatient Hospital Stay (HOSPITAL_COMMUNITY): Payer: Medicare HMO

## 2020-05-13 DIAGNOSIS — L03115 Cellulitis of right lower limb: Secondary | ICD-10-CM | POA: Diagnosis not present

## 2020-05-13 LAB — CULTURE, BLOOD (ROUTINE X 2)
Culture: NO GROWTH
Culture: NO GROWTH
Special Requests: ADEQUATE
Special Requests: ADEQUATE

## 2020-05-13 MED ORDER — IPRATROPIUM-ALBUTEROL 0.5-2.5 (3) MG/3ML IN SOLN
3.0000 mL | Freq: Three times a day (TID) | RESPIRATORY_TRACT | Status: DC
  Administered 2020-05-13 – 2020-05-15 (×5): 3 mL via RESPIRATORY_TRACT
  Filled 2020-05-13 (×5): qty 3

## 2020-05-13 NOTE — Progress Notes (Signed)
PROGRESS NOTE    Dylan Harrington  HWE:[id number] DOB: Dec 25, 1940 DOA: 05/07/2020 PCP: Winifred Olive, MD    Chief Complaint  Patient presents with  . Foot Problem  . Wound Infection    Brief Narrative:   80yo w/ a hx of COPD, OSA, HTN, BPH, tobacco abuse, and neuropathy who presented to the ER with worsening right foot pain after having stepped on sharp object 10 days prior resulting in a wound to the bottom of his foot.  He had progressive erythema and purulent drainage despite use of Bactrim in the outpatient setting per his PCP.  Following admission the patient was treated with IV vancomycin and meropenem.  He underwent excisional debridement and wound VAC placement per Dr. Lajoyce Corners 3/26.  Superficial wound cultures revealed MRSA.  ID directing antibiotic therapy.  Subjective:   Appear slow to answer questions, not oriented to the year, but to the month and place, asks when he can go home Appear to have wheezing and sob but he denies feeling sob  Wound vac to right foot  External urinary catheter with concentrated urine  He declined  cpap last night  Assessment & Plan:   Principal Problem:   Cellulitis of right foot Active Problems:   COPD (chronic obstructive pulmonary disease) (HCC)   Essential hypertension   Skin ulcer of plantar aspect of foot (HCC)   Nicotine dependence, cigarettes, uncomplicated   BPH without urinary obstruction   Lactic acidosis   Hypokalemia   Cutaneous abscess of right foot   Protein-calorie malnutrition, severe   Foot abscess, right   Right foot cellulitis/abscess, sepsis present on admission -Received Tdap vaccine during this hospitalization (stepped on sharp object 10 days prior resulting in a wound to the bottom of his foot.) -Status post I&D March 26 -Currently on vancomycin and Flagyl, Plan to discharge on Zyvox, has appointment with ID on May 26, 2020 at 3:45 PM -Per Dr. Lajoyce Corners patient can discharge with prevena plus portable pump,  non weight bearing right foot, f/u with Dr. Lajoyce Corners in 1 week   AKI on CKD 2 -Resolved,  -Home medication lisinopril HCTZ held since admission  Hypertension Blood pressure stable on metoprolol, continue hold lisinopril HCTZ  COPD/tobacco use -Mild wheezing heard on exam today, will get chest x-ray, add on DuoNeb -Encourage smoking cessation  BPH Continue Flomax  Peripheral neuropathy Continue Neurontin  OSA Encourage CPAP compliance He declined CPAP last night  Nutritional Assessment: The patient's BMI is: Body mass index is 28.2 kg/m.Marland KitchenSeen by dietician.  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     Unresulted Labs (From admission, onward)          Start     Ordered   05/14/20 0500  CBC with Differential/Platelet  Tomorrow morning,   R       Question:  Specimen collection method  Answer:  Lab=Lab collect   05/13/20 1650   05/14/20 0500  Comprehensive metabolic panel  Tomorrow morning,   R       Question:  Specimen collection method  Answer:  Lab=Lab collect   05/13/20 1650   05/14/20 0500  Magnesium  Tomorrow morning,   R       Question:  Specimen collection method  Answer:  Lab=Lab collect   05/13/20 1650            DVT prophylaxis: SCDs Start: 05/09/20 1054 enoxaparin (LOVENOX) injection 40 mg Start: 05/08/20 1000   Code Status: Full Family Communication: Patient Disposition:   Status is: Inpatient   Dispo: The patient is from: home              Anticipated d/c is to: SNF versus home health              Anticipated d/c date is: Likely  tomorrow                Consultants:   Orthopedics  ID  Procedures:  Status post I&D March 26 right foot  Antimicrobials:    Anti-infectives (From admission, onward)   Start     Dose/Rate Route Frequency Ordered Stop   05/11/20 1400  metroNIDAZOLE (FLAGYL) tablet 500 mg        500 mg Oral Every 8 hours 05/11/20 1129     05/08/20 1000  vancomycin (VANCOREADY) IVPB 750 mg/150 mL        750 mg 150 mL/hr over 60 Minutes Intravenous Every 12 hours 05/08/20 0213     05/08/20 0215  meropenem (MERREM) 1 g in sodium chloride 0.9 % 100 mL IVPB  Status:  Discontinued        1 g 200 mL/hr over 30 Minutes Intravenous Every 8 hours 05/08/20 0211 05/11/20 1129   05/08/20 0215  vancomycin (VANCOREADY) IVPB 1500 mg/300 mL        1,500 mg 150 mL/hr over 120 Minutes Intravenous  Once 05/08/20 0211 05/08/20 0507         Objective: Vitals:   05/13/20 0440 05/13/20 0800 05/13/20 1441 05/13/20 1605  BP: (!) 148/66 (!) 148/93 132/75   Pulse: 67 95 (!) 56   Resp: 18 17 17    Temp: 98.4 F (36.9 C) 98 F (36.7 C) 98.5 F (36.9 C)   TempSrc: Oral Oral Oral   SpO2: 97% 93% 95% 95%  Weight:      Height:        Intake/Output Summary (Last 24 hours) at 05/13/2020 1658 Last data filed at 05/13/2020 0850 Gross per 24 hour  Intake --  Output 650 ml  Net -650 ml   Filed Weights   05/07/20 2308 05/08/20 1355  Weight: 94.3 kg 94.3 kg    Examination:  General exam: frail elderly, NAD Respiratory system: mild diffused wheezing vs upper airway sounds,  Respiratory effort normal. Cardiovascular system: S1 & S2 heard, RRR. No JVD, no murmur, No pedal edema. Gastrointestinal system: Abdomen is nondistended, soft and nontender.  Normal bowel sounds heard. Central nervous system: Alert and oriented. No focal neurological deficits. Extremities: generalized weakness, right foot post op changes, dressing and wound vac in place Skin: No rashes, lesions or ulcers Psychiatry: appear slightly  confused, but calm and cooperative     Data Reviewed: I have personally reviewed following labs and imaging studies  CBC: Recent Labs  Lab 05/07/20 2333 05/08/20 0447 05/09/20 0301 05/10/20 0136 05/11/20 0831  WBC 11.9* 9.6 9.8 8.7 6.5  NEUTROABS 8.6* 6.9  --   --   --   HGB 13.4 13.4 12.6* 11.3* 11.6*  HCT 41.7 40.2 37.5* 35.0* 35.7*  MCV 98.8 96.6 96.2 99.2 98.9  PLT 294 278 275 269 251    Basic Metabolic Panel: Recent Labs  Lab 05/07/20 2333 05/08/20 0447 05/09/20 0301 05/10/20  0136 05/11/20 0831  NA 133* 135 135 133* 135  K 2.9* 3.0* 4.3 4.4 3.9  CL 97* 98 100 99 102  CO2 GLUCOSE 113* 102* 96 112* 169*  BUN CREATININE 1.42* 1.25* 1.10 1.17 0.92  CALCIUM 8.5* 8.4* 8.5* 7.9* 8.0*  MG  --  1.8 1.5* 1.8 2.0  PHOS  --   --   --  3.1 2.0*    GFR: Estimated Creatinine Clearance: 77.6 mL/min (by C-G formula based on SCr of 0.92 mg/dL).  Liver Function Tests: Recent Labs  Lab 05/07/20 2333 05/08/20 0447 05/10/20 0136 05/11/20 0831  AST 18 17  --   --   ALT 12 12  --   --   ALKPHOS 60 49  --   --   BILITOT 0.7 0.9  --   --   PROT 8.3* 7.8  --   --   ALBUMIN 2.5* 2.3* 2.1* 2.0*    CBG: No results for input(s): GLUCAP in the last 168 hours.   Recent Results (from the past 240 hour(s))  Culture, blood (Routine x 2)     Status: None   Collection Time: 05/07/20 11:00 PM   Specimen: BLOOD LEFT ARM  Result Value Ref Range Status   Specimen Description BLOOD LEFT ARM  Final   Special Requests   Final    BOTTLES DRAWN AEROBIC AND ANAEROBIC Blood Culture adequate volume   Culture   Final    NO GROWTH 5 DAYS Performed at [facility], [address], [place], [place] [address]    Report Status 05/13/2020 FINAL  Final  Culture, blood (Routine x 2)     Status: None   Collection Time: 05/07/20 11:27 PM   Specimen: BLOOD RIGHT ARM  Result Value Ref Range Status   Specimen Description BLOOD RIGHT ARM  Final   Special  Requests AEROBIC BOTTLE ONLY Blood Culture adequate volume  Final   Culture   Final    NO GROWTH 5 DAYS Performed at [facility], [address], [place], [place] [address]    Report Status 05/13/2020 FINAL  Final  Resp Panel by RT-PCR (Flu A&B, Covid) Nasopharyngeal Swab     Status: None   Collection Time: 05/08/20  1:38 AM   Specimen: Nasopharyngeal Swab; Nasopharyngeal(NP) swabs in vial transport medium  Result Value Ref Range Status   SARS Coronavirus 2 by RT PCR NEGATIVE NEGATIVE Final    Comment: (NOTE) SARS-CoV-2 target nucleic acids are NOT DETECTED.  The SARS-CoV-2 RNA is generally detectable in upper respiratory specimens during the acute phase of infection. The lowest concentration of SARS-CoV-2 viral copies this assay can detect is 138 copies/mL. A negative result does not preclude SARS-Cov-2 infection and should not be used as the sole basis for treatment or other patient management decisions. A negative result may occur with  improper specimen collection/handling, submission of specimen other than nasopharyngeal swab, presence of viral mutation(s) within the areas targeted by this assay, and inadequate number of viral copies(<138 copies/mL). A negative result must be combined with clinical observations, patient history, and epidemiological information. The expected result is Negative.  Fact Sheet for Patients:  BloggerCourse.com  Fact Sheet for Healthcare Providers:  SeriousBroker.it  This test is no t yet approved or cleared by the [place] FDA and  has been authorized for detection and/or diagnosis of SARS-CoV-2 by FDA under an Emergency Use Authorization (EUA). This  EUA will remain  in effect (meaning this test can be used) for the duration of the COVID-19 declaration under Section 564(b)(1) of the Act, 21 U.S.C.section 360bbb-3(b)(1), unless the authorization is terminated  or revoked sooner.        Influenza A by PCR NEGATIVE NEGATIVE Final   Influenza B by PCR NEGATIVE NEGATIVE Final    Comment: (NOTE) The Xpert Xpress SARS-CoV-2/FLU/RSV plus assay is intended as an aid in the diagnosis of influenza from Nasopharyngeal swab specimens and should not be used as a sole basis for treatment. Nasal washings and aspirates are unacceptable for Xpert Xpress SARS-CoV-2/FLU/RSV testing.  Fact Sheet for Patients: BloggerCourse.com  Fact Sheet for Healthcare Providers: SeriousBroker.it  This test is not yet approved or cleared by the [place] FDA and has been authorized for detection and/or diagnosis of SARS-CoV-2 by FDA under an Emergency Use Authorization (EUA). This EUA will remain in effect (meaning this test can be used) for the duration of the COVID-19 declaration under Section 564(b)(1) of the Act, 21 U.S.C. section 360bbb-3(b)(1), unless the authorization is terminated or revoked.  Performed at [facility] Lab, [address], [place], [place] [address]   Aerobic Culture w Gram Stain (superficial specimen)     Status: None   Collection Time: 05/08/20  4:54 AM   Specimen: Wound  Result Value Ref Range Status   Specimen Description WOUND  Final   Special Requests FOOT RIGHT  Final   Gram Stain   Final    FEW WBC PRESENT,BOTH PMN AND MONONUCLEAR FEW GRAM POSITIVE COCCI Performed at [facility], [address], [place], [place] [address]    Culture   Final    ABUNDANT METHICILLIN RESISTANT STAPHYLOCOCCUS AUREUS   Report Status 05/10/2020 FINAL  Final   Organism ID, Bacteria METHICILLIN RESISTANT STAPHYLOCOCCUS AUREUS  Final      Susceptibility   Methicillin resistant staphylococcus aureus - MIC*    CIPROFLOXACIN >=8 RESISTANT Resistant     ERYTHROMYCIN >=8 RESISTANT Resistant     GENTAMICIN <=0.5 SENSITIVE Sensitive     OXACILLIN >=4 RESISTANT Resistant     TETRACYCLINE >=16 RESISTANT Resistant      VANCOMYCIN <=0.5 SENSITIVE Sensitive     TRIMETH/SULFA >=320 RESISTANT Resistant     CLINDAMYCIN <=0.25 SENSITIVE Sensitive     RIFAMPIN <=0.5 SENSITIVE Sensitive     Inducible Clindamycin NEGATIVE Sensitive     * ABUNDANT METHICILLIN RESISTANT STAPHYLOCOCCUS AUREUS  MRSA PCR Screening     Status: Abnormal   Collection Time: 05/09/20  7:41 AM   Specimen: Nasal Mucosa; Nasopharyngeal  Result Value Ref Range Status   MRSA by PCR POSITIVE (A) NEGATIVE Final    Comment:        The GeneXpert MRSA Assay (FDA approved for NASAL specimens only), is one component of a comprehensive MRSA colonization surveillance program. It is not intended to diagnose MRSA infection nor to guide or monitor treatment for MRSA infections. RESULT CALLED TO, READ BACK BY AND VERIFIED WITH: RN MARISSA [phone] MLM Performed at [facility] Lab, [address], [place], [place] [address]   Aerobic/Anaerobic Culture w Gram Stain (surgical/deep wound)     Status: None (Preliminary result)   Collection Time: 05/09/20  9:18 AM   Specimen: PATH Soft tissue  Result Value Ref Range Status   Specimen Description TISSUE  Final   Special Requests FOOT RIGHT  Final   Gram Stain   Final    NO WBC SEEN  NO ORGANISMS SEEN Performed at [facility], [address] [place], [place] [address]    Culture   Final    MODERATE METHICILLIN RESISTANT STAPHYLOCOCCUS AUREUS CRITICAL RESULT CALLED TO, READ BACK BY AND VERIFIED WITH: RN DOROTHY M. [phone] FCP NO ANAEROBES ISOLATED; CULTURE IN PROGRESS FOR 5 DAYS    Report Status PENDING  Incomplete   Organism ID, Bacteria METHICILLIN RESISTANT STAPHYLOCOCCUS AUREUS  Final      Susceptibility   Methicillin resistant staphylococcus aureus - MIC*    CIPROFLOXACIN >=8 RESISTANT Resistant     ERYTHROMYCIN >=8 RESISTANT Resistant     GENTAMICIN <=0.5 SENSITIVE Sensitive     OXACILLIN >=4 RESISTANT Resistant     TETRACYCLINE >=16 RESISTANT Resistant     VANCOMYCIN  <=0.5 SENSITIVE Sensitive     TRIMETH/SULFA >=320 RESISTANT Resistant     CLINDAMYCIN <=0.25 SENSITIVE Sensitive     RIFAMPIN <=0.5 SENSITIVE Sensitive     Inducible Clindamycin NEGATIVE Sensitive     * MODERATE METHICILLIN RESISTANT STAPHYLOCOCCUS AUREUS         Radiology Studies: No results found.      Scheduled Meds: . Chlorhexidine Gluconate Cloth  6 each Topical Q0600  . docusate sodium  100 mg Oral BID  . enoxaparin (LOVENOX) injection  40 mg Subcutaneous Q24H  . feeding supplement  237 mL Oral TID BM  . gabapentin  300 mg Oral QHS  . ipratropium-albuterol  3 mL Nebulization TID AC  . metoprolol tartrate  25 mg Oral Daily  . metroNIDAZOLE  500 mg Oral Q8H  . multivitamin with minerals  1 tablet Oral Daily  . mupirocin ointment  1 application Nasal BID  . tamsulosin  0.4 mg Oral Daily   Continuous Infusions: . vancomycin 750 mg (05/13/20 1337)     LOS: 5 days   Time spent: 25mins Greater than 50% of this time was spent in counseling, explanation of diagnosis, planning of further management, and coordination of care.   Voice Recognition Reubin Milan/Dragon dictation system was used to create this note, attempts have been made to correct errors. Please contact the author with questions and/or clarifications.   Albertine GratesFang Ciarah Peace, MD PhD FACP [facility]  Available via Epic secure chat 7am-7pm for nonurgent issues Please page for urgent issues To page the attending provider between 7A-7P or the covering provider during after hours 7P-7A, please log into the web site [email or web address] and access using universal [place] password for that web site. If you do not have the password, please call the hospital operator.    05/13/2020, 4:58 PM

## 2020-05-13 NOTE — Progress Notes (Signed)
Nutrition Follow Up  DOCUMENTATION CODES:   Severe malnutrition in context of social or environmental circumstances  INTERVENTION:   Replete phosphorus    Ensure Enlive po TID, each supplement provides 350 kcal and 20 grams of protein.  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  Ongoing  GOAL:  Patient will meet greater than or equal to 90% of their needs   Progressing   MONITOR:   Diet advancement,Labs  REASON FOR ASSESSMENT:   Consult Assessment of nutrition requirement/status  ASSESSMENT:  80 yo male with a PMH of HTN, COPD, pacemaker placement, and renal disorder who presents with cellulitis of right foot.  3/26- s/p excisional debridement R foot  Appetite progressing. Last four meal completions charted as 100%, 50%, 50%, and 40%. Taking Ensure 2-3 times daily. RD helped patient set up lunch tray. Encouraged continued meal and supplement intake.   Admission weight: 94.3 kg (no current weight)  UOP: 650 ml x 24 hrs   Medications: colace Labs: Phosphorus 2.0 (L)  Diet Order:   Diet Order            Diet regular Room service appropriate? Yes; Fluid consistency: Thin  Diet effective now                EDUCATION NEEDS:  Education needs have been addressed  Skin:  Skin Assessment: Skin Integrity Issues: Skin Integrity Issues:: Incision  Other: R foot  Last BM:  3/27  Height:  Ht Readings from Last 1 Encounters:  05/08/20 6' (1.829 m)   Weight:  Wt Readings from Last 1 Encounters:  05/08/20 94.3 kg   Ideal Body Weight:  81 kg  BMI:  Body mass index is 28.2 kg/m.  Estimated Nutritional Needs:   Kcal:  1800-2000   Protein:  105-120 grams   Fluid:  >2 L  Vanessa Kick RD,  LDN Clinical Nutrition Pager listed in AMION

## 2020-05-13 NOTE — NC FL2 (Signed)
[place] MEDICAID FL2 LEVEL OF CARE SCREENING TOOL     IDENTIFICATION  Patient Name: Dylan Harrington Birthdate: [birth year] Sex: male Admission Date (Current Location): 05/07/2020  [facility] and [place] Number:  Producer, television/film/video and Address:  [facility] [place], [address] [place], [place] [address]      Provider Number: [id number]  Attending Physician Name and Address:  Albertine Grates, MD  Relative Name and Phone Number:  Rodarius, Kichline (Spouse)   [phone] [facility] Phone)    Current Level of Care: Hospital Recommended Level of Care: Skilled Nursing Facility Prior Approval Number:    Date Approved/Denied:   PASRR Number: [id number]  Discharge Plan: SNF    Current Diagnoses: Patient Active Problem List   Diagnosis Date Noted  . Cellulitis of right foot 05/08/2020  . Skin ulcer of plantar aspect of foot (HCC) 05/08/2020  . Nicotine dependence, cigarettes, uncomplicated 05/08/2020  . 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 Lactic acidosis 05/08/2020  . Hypokalemia 05/08/2020  . Protein-calorie malnutrition, severe 05/08/2020  . Foot abscess, right 05/08/2020  . Cutaneous abscess of right foot   . Obstructive sleep apnea 03/15/2016  . Essential hypertension 03/15/2016  . COPD (chronic obstructive pulmonary disease) (HCC) 03/14/2016    Orientation RESPIRATION BLADDER Height & Weight     Self,Time,Situation,Place  Normal External catheter Weight: 207 lb 14.3 oz (94.3 kg) Height:  6' (182.9 cm)  BEHAVIORAL SYMPTOMS/MOOD NEUROLOGICAL BOWEL NUTRITION STATUS      Continent Diet (see d/c summary)  AMBULATORY STATUS COMMUNICATION OF NEEDS Skin   Extensive Assist Verbally Surgical wounds,Wound Vac (Negative pressure wound therapy right heel)                       Personal Care Assistance Level of Assistance  Bathing,Feeding,Dressing Bathing Assistance: Limited assistance Feeding assistance: Independent Dressing Assistance: Limited  assistance     Functional Limitations Info  Sight,Hearing,Speech Sight Info: Adequate Hearing Info: Adequate Speech Info: Adequate    SPECIAL CARE FACTORS FREQUENCY  PT (By licensed PT),OT (By licensed OT)     PT Frequency: 5x/week OT Frequency: 5x/week            Contractures Contractures Info: Not present    Additional Factors Info  Code Status,Allergies Code Status Info: Full Code Allergies Info: Keflex (cephalexin)           Current Medications (05/13/2020):  This is the current hospital active medication list Current Facility-Administered Medications  Medication Dose Route Frequency Provider Last Rate Last Admin  . (feeding supplement) PROSource Plus liquid 30 mL  30 mL Oral BID BM Nadara Mustard, MD   30 mL at 05/13/20 0846  . acetaminophen (TYLENOL) tablet 650 mg  650 mg Oral Q6H PRN Nadara Mustard, MD      . albuterol (PROVENTIL) (2.5 MG/3ML) 0.083% nebulizer solution 2.5 mg  2.5 mg Nebulization Q2H PRN Nadara Mustard, MD      . bisacodyl (DULCOLAX) suppository 10 mg  10 mg Rectal Daily PRN Nadara Mustard, MD      . Chlorhexidine Gluconate Cloth 2 % PADS 6 each  6 each Topical Q0600 Almon Hercules, MD   6 each at 05/13/20 0531  . docusate sodium (COLACE) capsule 100 mg  100 mg Oral BID Nadara Mustard, MD   100 mg at 05/13/20 0846  . enoxaparin (LOVENOX) injection 40 mg  40 mg Subcutaneous Q24H Nadara Mustard, MD   40 mg at  05/13/20 0846  . feeding supplement (ENSURE ENLIVE / ENSURE PLUS) liquid 237 mL  237 mL Oral TID BM Nadara Mustard, MD   237 mL at 05/13/20 0846  . gabapentin (NEURONTIN) capsule 300 mg  300 mg Oral QHS Nadara Mustard, MD   300 mg at 05/12/20 2146  . guaiFENesin (MUCINEX) 12 hr tablet 600 mg  600 mg Oral BID PRN Lonia Blood, MD      . hydrALAZINE (APRESOLINE) tablet 25 mg  25 mg Oral Q8H PRN Nadara Mustard, MD      . HYDROmorphone (DILAUDID) injection 0.5 mg  0.5 mg Intravenous Q4H PRN Candelaria Stagers T, MD      . magnesium citrate solution 1  Bottle  1 Bottle Oral Once PRN Nadara Mustard, MD      . methocarbamol (ROBAXIN) tablet 500 mg  500 mg Oral Q6H PRN Nadara Mustard, MD   500 mg at 05/10/20 1501  . metoprolol tartrate (LOPRESSOR) tablet 25 mg  25 mg Oral Daily Nadara Mustard, MD   25 mg at 05/13/20 0846  . metroNIDAZOLE (FLAGYL) tablet 500 mg  500 mg Oral Q8H Odette Fraction, MD   500 mg at 05/13/20 0529  . multivitamin with minerals tablet 1 tablet  1 tablet Oral Daily Nadara Mustard, MD   1 tablet at 05/13/20 0845  . mupirocin ointment (BACTROBAN) 2 % 1 application  1 application Nasal BID Almon Hercules, MD   1 application at 05/13/20 0846  . ondansetron (ZOFRAN) tablet 4 mg  4 mg Oral Q6H PRN Nadara Mustard, MD       Or  . ondansetron [facility]) injection 4 mg  4 mg Intravenous Q6H PRN Nadara Mustard, MD      . oxyCODONE (Oxy IR/ROXICODONE) immediate release tablet 5 mg  5 mg Oral Q6H PRN Candelaria Stagers T, MD   5 mg at 05/12/20 1750  . polyethylene glycol (MIRALAX / GLYCOLAX) packet 17 g  17 g Oral Daily PRN Nadara Mustard, MD      . tamsulosin [facility]) capsule 0.4 mg  0.4 mg Oral Daily Nadara Mustard, MD   0.4 mg at 05/13/20 0845  . vancomycin (VANCOREADY) IVPB 750 mg/150 mL  750 mg Intravenous Q12H Nadara Mustard, MD 150 mL/hr at 05/13/20 0205 750 mg at 05/13/20 0205     Discharge Medications: Please see discharge summary for a list of discharge medications.  Relevant Imaging Results:  Relevant Lab Results:   Additional Information SSN [id number] [place], [place]

## 2020-05-13 NOTE — Plan of Care (Signed)
  Problem: Education: Goal: Knowledge of General Education information will improve Description: Including pain rating scale, medication(s)/side effects and non-pharmacologic comfort measures Outcome: Progressing   Problem: Health Behavior/Discharge Planning: Goal: Ability to manage health-related needs will improve Outcome: Progressing   Problem: Clinical Measurements: Goal: Ability to maintain clinical measurements within normal limits will improve Outcome: Progressing   Problem: Nutrition: Goal: Adequate nutrition will be maintained Outcome: Progressing   Problem: Elimination: Goal: Will not experience complications related to urinary retention Outcome: Progressing   Problem: Pain Managment: Goal: General experience of comfort will improve Outcome: Progressing   

## 2020-05-13 NOTE — Progress Notes (Signed)
03/29 2200 Pt refused CPAP. Pt educated.

## 2020-05-13 NOTE — TOC Progression Note (Signed)
Transition of Care [facility]) - Progression Note    Patient Details  Name: Dylan Harrington MRN: [id number] Date of Birth: [birth year]  Transition of Care [facility]) CM/SW [place], [place] Phone Number: 05/13/2020, 10:12 AM  Clinical Narrative:     CSW met with pt and discussed updated PT recommendation for SNF at this time. Pt is agreeable to SNF workup with preference for [place] area. He reports he is not covid vaccinated. He does not want CSW to update spouse at this time. CSW to complete fl2 and fax bed requests in hub.   Expected Discharge Plan: [place] Barriers to Discharge: Continued Medical Work up  Expected Discharge Plan and Services Expected Discharge Plan: [place]   Discharge Planning Services: CM Consult Post Acute Care Choice: [place] Living arrangements for the past 2 months: [place]: PT [place]: [place] Date [place]: 05/10/20   Representative spoke with at [place]: [place] ([place]) Interventions    Readmission Risk Interventions No flowsheet data found.

## 2020-05-13 NOTE — TOC Progression Note (Addendum)
Transition of Care [facility]) - Progression Note    Patient Details  Name: Dylan Harrington MRN: [id number] Date of Birth: [birth year]  Transition of Care [facility]) CM/SW Contact  Epifanio Lesches, RN Phone Number: 05/13/2020, 2:50 PM  Clinical Narrative:    NCM confirmed with pt and daughter, Synetta Fail [phone]) , d/c plan: home with home health services. Pt declines SNF placement. Pt states he will d/c to daughter's residence, [address] [place], [address].  NCM made [facility] aware of d/c plan, referral accepted on 3/27. Pt will be provided from floor stock rolling walker and 3in1/BSC prior to d/c by nurse.  TOC team will continue to monitor and assist with TOC needs   05/13/2020 @ 1500 Call received from [place]. [facility] unable to provide [facility] services 2/2 not servicing area. Referral made with [facility].... acceptance pending.  Expected Discharge Plan: Home w Home Health Services Barriers to Discharge: Continued Medical Work up  Expected Discharge Plan and Services Expected Discharge Plan: Home w Home Health Services   Discharge Planning Services: CM Consult Post Acute Care Choice: Skilled Nursing Facility Living arrangements for the past 2 months: Single Family Home                 DME Arranged: 3-N-1,Walker rolling DME Agency: [employer] Date DME Agency Contacted: 05/13/20 Time DME Agency Contacted: [phone] Representative spoke with at DME Agency: Nurse to provide to pt from floor stock prior to discharge HH Arranged: PT,OT,RN,Nurse's Aide,Social Work [employer] Agency: [facility] Date [facility] Agency Contacted: 05/13/20 Time HH Agency Contacted: [phone] Representative spoke with at [facility] Agency: Kandee Keen   Social Determinants of Health (SDOH) Interventions    Readmission Risk Interventions No flowsheet data found.

## 2020-05-13 NOTE — TOC Progression Note (Addendum)
Transition of Care [facility]) - Progression Note    Patient Details  Name: EDUARDO HONOR MRN: [id number] Date of Birth: 10-10-1940  Transition of Care [facility]) CM/SW [place], [place] Phone Number: 05/13/2020, 2:01 PM  Clinical Narrative:     CSW met with pt and provided SNF offers. Pt explains he doesn't care which one he goes to. CSW asked pt if there is someone who could help him with decision. Pt states his wife can help and is agreeable to for CSW to contact wife. CSW called wife at [phone]; no answer, left voicemail requesting return call.    CSW is notified by RN that pt's wife has dementia and that daughter Berlin Hun, [phone], is calling with questions. Pt consents to CSW calling Rodena Piety. Rodena Piety expresses concern about pt being primary care giver of his wife who has dementia. She alludes to pt's son's having substance use issues and doubt that they could care for pt and pt wife. She inquires about possible SNF placement in [place]; [place] explains that pt is medically stable and that facility would need to be arranged in next day; she does not have any identified SNF facilities in [place]. She then inquires about option for pt to go home with her near [place] with [facility]. She states she is a Marine scientist and would be able to provide support for both pt and pt wife. She states she would be able to transport pt to her address tomorrow. She provides address of [address] [place], [place] [address]. RNCM to follow up with HH/DME arrangements.   Expected Discharge Plan: [place] Barriers to Discharge: Continued Medical Work up  Expected Discharge Plan and Services Expected Discharge Plan: [place]   Discharge Planning Services: CM Consult Post Acute Care Choice: [place] Living arrangements for the past 2 months: [place]: PT [place]: [place] Date [place]: 05/10/20   Representative spoke with at [place]: [place] ([place]) Interventions    Readmission Risk Interventions No flowsheet data found.

## 2020-05-13 NOTE — Plan of Care (Signed)
?  Problem: Clinical Measurements: ?Goal: Ability to maintain clinical measurements within normal limits will improve ?Outcome: Progressing ?Goal: Will remain free from infection ?Outcome: Progressing ?Goal: Diagnostic test results will improve ?Outcome: Progressing ?  ?

## 2020-05-13 NOTE — Progress Notes (Signed)
Patient is postop day 4 status post excisional debridement abscess right heel he has had no further drainage and the VAC since yesterday.  He is sleeping but refusing to wear his CPAP per nursing   [facility] is functioning with 2 green checks.  Patient awakened while I was in the room.  I did discuss with him the importance of proper oxygenation for general and wound healing.

## 2020-05-13 NOTE — Progress Notes (Signed)
PT Cancellation Note  Patient Details Name: Dylan Harrington MRN: [id number] DOB: 05-10-1940   Cancelled Treatment:    Reason Eval/Treat Not Completed: (P) Patient declined, no reason specified (Pt eating lunch and declined PT session will f/u per POC.)   Daisha Filosa Artis Delay 05/13/2020, 12:14 PM  Bonney Leitz , PTA Acute Rehabilitation Services Pager [phone] Office [phone]

## 2020-05-14 ENCOUNTER — Inpatient Hospital Stay (HOSPITAL_COMMUNITY): Payer: Medicare HMO

## 2020-05-14 DIAGNOSIS — L03115 Cellulitis of right lower limb: Secondary | ICD-10-CM | POA: Diagnosis not present

## 2020-05-14 LAB — COMPREHENSIVE METABOLIC PANEL WITH GFR
ALT: 12 U/L (ref 0–44)
AST: 20 U/L (ref 15–41)
Albumin: 1.8 g/dL — ABNORMAL LOW (ref 3.5–5.0)
Alkaline Phosphatase: 38 U/L (ref 38–126)
Anion gap: 4 — ABNORMAL LOW (ref 5–15)
BUN: 20 mg/dL (ref 8–23)
CO2: 26 mmol/L (ref 22–32)
Calcium: 7.9 mg/dL — ABNORMAL LOW (ref 8.9–10.3)
Chloride: 106 mmol/L (ref 98–111)
Creatinine, Ser: 1.01 mg/dL (ref 0.61–1.24)
GFR, Estimated: >60 mL/min
Glucose, Bld: 147 mg/dL — ABNORMAL HIGH (ref 70–99)
Potassium: 3.6 mmol/L (ref 3.5–5.1)
Sodium: 136 mmol/L (ref 135–145)
Total Bilirubin: 1 mg/dL (ref 0.3–1.2)
Total Protein: 6.9 g/dL (ref 6.5–8.1)

## 2020-05-14 LAB — CBC WITH DIFFERENTIAL/PLATELET
Abs Immature Granulocytes: 0.1 K/uL — ABNORMAL HIGH (ref 0.00–0.07)
Basophils Absolute: 0 K/uL (ref 0.0–0.1)
Basophils Relative: 0 %
Eosinophils Absolute: 0 K/uL (ref 0.0–0.5)
Eosinophils Relative: 0 %
HCT: 33.9 % — ABNORMAL LOW (ref 39.0–52.0)
Hemoglobin: 11 g/dL — ABNORMAL LOW (ref 13.0–17.0)
Immature Granulocytes: 1 %
Lymphocytes Relative: 17 %
Lymphs Abs: 1.5 K/uL (ref 0.7–4.0)
MCH: 31.7 pg (ref 26.0–34.0)
MCHC: 32.4 g/dL (ref 30.0–36.0)
MCV: 97.7 fL (ref 80.0–100.0)
Monocytes Absolute: 1.1 K/uL — ABNORMAL HIGH (ref 0.1–1.0)
Monocytes Relative: 13 %
Neutro Abs: 5.8 K/uL (ref 1.7–7.7)
Neutrophils Relative %: 69 %
Platelets: 264 K/uL (ref 150–400)
RBC: 3.47 MIL/uL — ABNORMAL LOW (ref 4.22–5.81)
RDW: 14.2 % (ref 11.5–15.5)
WBC: 8.5 K/uL (ref 4.0–10.5)
nRBC: 0 % (ref 0.0–0.2)

## 2020-05-14 LAB — AEROBIC/ANAEROBIC CULTURE W GRAM STAIN (SURGICAL/DEEP WOUND): Gram Stain: NONE SEEN

## 2020-05-14 LAB — MAGNESIUM: Magnesium: 1.9 mg/dL (ref 1.7–2.4)

## 2020-05-14 NOTE — TOC Progression Note (Signed)
Transition of Care [facility]) - Progression Note    Patient Details  Name: Dylan Harrington MRN: [id number] Date of Birth: Jul 17, 1940  Transition of Care [facility]) CM/SW Contact  Carmina Miller, [place] Phone Number: 05/14/2020, 5:00 PM  Clinical Narrative:    CSW started auth with [place], ref #[id number], clinicals faxed.    Expected Discharge Plan: Home w Home Health Services Barriers to Discharge: Continued Medical Work up  Expected Discharge Plan and Services Expected Discharge Plan: Home w Home Health Services   Discharge Planning Services: CM Consult Post Acute Care Choice: Skilled Nursing Facility Living arrangements for the past 2 months: Single Family Home                 DME Arranged: Wheelchair manual DME Agency: [employer] Date DME Agency Contacted: 05/14/20 Time DME Agency Contacted: [phone] Representative spoke with at DME Agency: [employer]/[place] HH Arranged: PT,OT,RN,Nurse's Aide,Social Work [employer] Agency: [facility] Date [facility] Agency Contacted: 05/13/20 Time HH Agency Contacted: 1450 Representative spoke with at [facility] Agency: Kandee Keen   Social Determinants of Health (SDOH) Interventions    Readmission Risk Interventions No flowsheet data found.

## 2020-05-14 NOTE — Progress Notes (Signed)
Pharmacy Antibiotic Note  Dylan Harrington is a 80 y.o. male admitted on 05/07/2020 with wound infection.  Pharmacy has been consulted for Vanco dosing.  ID: Foot wound, WBC WNL, Tmax 100.2. Scr <1 (Bactrim PTA)  - Debridement 3/26 with wound VAC  Merrem 3/25>>3/28 Flagyl 3/28>> Vanc 3/25>>  3/26: MRSA PCR: + 3/25 Right foot : MRSA 3/24 BC X 2: neg  3/28: VP 22, VT 12, AUC 419, cont 750 q12h  Plan: Cont vancomycin 750 mg IV q12h (had levels 3/28) Flagyl 500mg  po q8hr Plan to discharge on Zyvox, has appointment with ID     Height: 6' (182.9 cm) Weight: 94.3 kg (207 lb 14.3 oz) IBW/kg (Calculated) : 77.6  Temp (24hrs), Avg:98.9 F (37.2 C), Min:97.9 F (36.6 C), Max:100.2 F (37.9 C)  Recent Labs  Lab 05/07/20 2333 05/08/20 0140 05/08/20 0447 05/09/20 0301 05/10/20 0136 05/11/20 0831 05/11/20 1224 05/11/20 1612 05/12/20 0225 05/14/20 0258  WBC 11.9*  --  9.6 9.8 8.7 6.5  --   --   --  8.5  CREATININE 1.42*  --  1.25* 1.10 1.17 0.92  --   --   --  1.01  LATICACIDVEN 2.3* 1.7  --   --   --   --   --   --   --   --   VANCOTROUGH  --   --   --   --   --  12*  --   --  12*  --   VANCOPEAK  --   --   --   --   --   --  10* 22*  --   --     Estimated Creatinine Clearance: 70.7 mL/min (by C-G formula based on SCr of 1.01 mg/dL).    Allergies  Allergen Reactions  . Keflex [Cephalexin] Hives, Rash and Other (See Comments)    [name] S. 05/16/20, PharmD, BCPS Clinical Staff Pharmacist [email or web address]  [name] 05/14/2020 10:22 AM

## 2020-05-14 NOTE — Progress Notes (Addendum)
PROGRESS NOTE    Dylan Harrington  OAC:[id number] DOB: [birth year] DOA: 05/07/2020 PCP: Winifred Olive, MD    Chief Complaint  Patient presents with  . Foot Problem  . Wound Infection    Brief Narrative:   80yo w/ a hx of COPD, OSA, HTN, BPH, tobacco abuse, and neuropathy who presented to the ER with worsening right foot pain after having stepped on sharp object 10 days prior resulting in a wound to the bottom of his foot.  He had progressive erythema and purulent drainage despite use of Bactrim in the outpatient setting per his PCP.  Following admission the patient was treated with IV vancomycin and meropenem.  He underwent excisional debridement and wound VAC placement per Dr. Lajoyce Corners 3/26.  Superficial wound cultures revealed MRSA.  ID directing antibiotic therapy.  Subjective:  tmax 100.2 Appear slow to answer questions, not oriented to the year, but to the month and place, daughter states this is not his  baseline No wheezing today, he c/o right knee pain, right knee appears is swollen   Wound vac to right foot  External urinary catheter with concentrated urine  He again declined  cpap last night  Assessment & Plan:   Principal Problem:   Cellulitis of right foot Active Problems:   COPD (chronic obstructive pulmonary disease) (HCC)   Essential hypertension   Skin ulcer of plantar aspect of foot (HCC)   Nicotine dependence, cigarettes, uncomplicated   BPH without urinary obstruction   Lactic acidosis   Hypokalemia   Cutaneous abscess of right foot   Protein-calorie malnutrition, severe   Foot abscess, right   Right foot cellulitis/abscess, sepsis present on admission -Received Tdap vaccine during this hospitalization (stepped on sharp object 10 days prior resulting in a wound to the bottom of his foot.) -Status post I&D March 26 -Currently on vancomycin and Flagyl, Plan to discharge on Zyvox, has appointment with ID on May 26, 2020 at 3:45 PM -Per Dr. Lajoyce Corners  patient can discharge with prevena plus portable pump, non weight bearing right foot, f/u with Dr. Lajoyce Corners in 1 week  Right knee pain Concerning for joint fluids Knee x ray May need knee tap, Dr Lajoyce Corners will see patient  AKI on CKD 2 -Resolved,  -Home medication lisinopril HCTZ held since admission  Hypertension Blood pressure stable on metoprolol, continue hold lisinopril HCTZ  COPD/tobacco use -Mild wheezing heard on exam today, will get chest x-ray, add on DuoNeb -Encourage smoking cessation  BPH Continue Flomax  Peripheral neuropathy Continue Neurontin  OSA Encourage CPAP compliance He declined CPAP last night  Nutritional Assessment: The patient's BMI is: Body mass index is 28.2 kg/m.Marland KitchenSeen by dietician.  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     Unresulted Labs (From admission, onward)          Start     Ordered   05/15/20 0500  Uric acid  Tomorrow morning,   R       Question:  Specimen collection method  Answer:  Lab=Lab collect   05/14/20 1457   05/15/20 0500  CBC with Differential/Platelet  Tomorrow morning,   R       Question:  Specimen collection method  Answer:  Lab=Lab  collect   05/14/20 1711   05/15/20 0500  Basic metabolic panel  Tomorrow morning,   R       Question:  Specimen collection method  Answer:  Lab=Lab collect   05/14/20 1711   05/15/20 0500  Magnesium  Tomorrow morning,   R       Question:  Specimen collection method  Answer:  Lab=Lab collect   05/14/20 1711   05/15/20 0500  Sedimentation rate  Tomorrow morning,   R        Question:  Specimen collection method  Answer:  Lab=Lab collect   05/14/20 1711   05/15/20 0500  C-reactive protein  Tomorrow morning,   R       Question:  Specimen collection method  Answer:  Lab=Lab collect   05/14/20 1711            DVT prophylaxis: SCDs Start: 05/09/20 1054 enoxaparin (LOVENOX) injection 40 mg Start: 05/08/20 1000   Code Status: Full Family Communication: daughter at bedside  Disposition:   Status is: Inpatient   Dispo: The patient is from: home              Anticipated d/c is to: SNF versus home health              Anticipated d/c date is: need right knee to be evaluated                 Consultants:   Orthopedics  ID  Procedures:  Status post I&D March 26 right foot  Antimicrobials:    Anti-infectives (From admission, onward)   Start     Dose/Rate Route Frequency Ordered Stop   05/11/20 1400  metroNIDAZOLE (FLAGYL) tablet 500 mg        500 mg Oral Every 8 hours 05/11/20 1129     05/08/20 1000  vancomycin (VANCOREADY) IVPB 750 mg/150 mL        750 mg 150 mL/hr over 60 Minutes Intravenous Every 12 hours 05/08/20 0213     05/08/20 0215  meropenem (MERREM) 1 g in sodium chloride 0.9 % 100 mL IVPB  Status:  Discontinued        1 g 200 mL/hr over 30 Minutes Intravenous Every 8 hours 05/08/20 0211 05/11/20 1129   05/08/20 0215  vancomycin (VANCOREADY) IVPB 1500 mg/300 mL        1,500 mg 150 mL/hr over 120 Minutes Intravenous  Once 05/08/20 0211 05/08/20 0507         Objective: Vitals:   05/13/20 2017 05/14/20 0631 05/14/20 0737 05/14/20 1607  BP: (!) 148/75 129/80  132/64  Pulse: 95 87 90 98  Resp: (!) 24  Temp: 100.2 F (37.9 C) 97.9 F (36.6 C)  98.6 F (37 C)  TempSrc: Oral Oral  Oral  SpO2: 93% 95%  92%  Weight:      Height:        Intake/Output Summary (Last 24 hours) at 05/14/2020 1712 Last data filed at 05/14/2020 0900 Gross per 24 hour  Intake --  Output 700 ml  Net -700 ml   Filed Weights   05/07/20  2308 05/08/20 1355  Weight: 94.3 kg 94.3 kg    Examination:  General exam: frail elderly, NAD Respiratory system: no wheezing today,  Respiratory effort normal. Cardiovascular system: S1 & S2 heard, RRR. No JVD, no murmur, No pedal edema. Gastrointestinal system: Abdomen is nondistended, soft and nontender.  Normal bowel sounds heard. Central nervous system: Alert and oriented.  No focal neurological deficits. Extremities: generalized weakness, right foot post op changes, dressing and wound vac in place, right knee tender, warm to touch, swollen, no erythema  Skin: No rashes, lesions or ulcers Psychiatry: appear slightly confused, not oriented to the year, but calm and cooperative     Data Reviewed: I have personally reviewed following labs and imaging studies  CBC: Recent Labs  Lab 05/07/20 2333 05/08/20 0447 05/09/20 0301 05/10/20 0136 05/11/20 0831 05/14/20 0258  WBC 11.9* 9.6 9.8 8.7 6.5 8.5  NEUTROABS 8.6* 6.9  --   --   --  5.8  HGB 13.4 13.4 12.6* 11.3* 11.6* 11.0*  HCT 41.7 40.2 37.5* 35.0* 35.7* 33.9*  MCV 98.8 96.6 96.2 99.2 98.9 97.7  PLT 294 278 275 269 251 264    Basic Metabolic Panel: Recent Labs  Lab 05/08/20 0447 05/09/20 0301 05/10/20 0136 05/11/20 0831 05/14/20 0258  NA 135 135 133* 135 136  K 3.0* 4.3 4.4 3.9 3.6  CL 98 100 99 102 106  CO2 GLUCOSE 102* 96 112* 169* 147*  BUN CREATININE 1.25* 1.10 1.17 0.92 1.01  CALCIUM 8.4* 8.5* 7.9* 8.0* 7.9*  MG 1.8 1.5* 1.8 2.0 1.9  PHOS  --   --  3.1 2.0*  --     GFR: Estimated Creatinine Clearance: 70.7 mL/min (by C-G formula based on SCr of 1.01 mg/dL).  Liver Function Tests: Recent Labs  Lab 05/07/20 2333 05/08/20 0447 05/10/20 0136 05/11/20 0831 05/14/20 0258  AST 18 17  --   --  20  ALT 12 12  --   --  12  ALKPHOS 60 49  --   --  38  BILITOT 0.7 0.9  --   --  1.0  PROT 8.3* 7.8  --   --  6.9  ALBUMIN 2.5* 2.3* 2.1* 2.0* 1.8*    CBG: No results for  input(s): GLUCAP in the last 168 hours.   Recent Results (from the past 240 hour(s))  Culture, blood (Routine x 2)     Status: None   Collection Time: 05/07/20 11:00 PM   Specimen: BLOOD LEFT ARM  Result Value Ref Range Status   Specimen Description BLOOD LEFT ARM  Final   Special Requests   Final    BOTTLES DRAWN AEROBIC AND ANAEROBIC Blood Culture adequate volume   Culture   Final    NO GROWTH 5 DAYS Performed at [facility], [address] [place], [place] [address]    Report Status 05/13/2020 FINAL  Final  Culture, blood (Routine x 2)     Status: None   Collection Time: 05/07/20 11:27 PM   Specimen: BLOOD RIGHT ARM  Result Value Ref Range Status   Specimen Description BLOOD RIGHT ARM  Final   Special Requests AEROBIC BOTTLE ONLY Blood Culture adequate volume  Final   Culture   Final    NO GROWTH 5 DAYS Performed at [facility], [address], [place], [place] [address]    Report Status 05/13/2020 FINAL  Final  Resp Panel by RT-PCR (Flu A&B, Covid) Nasopharyngeal Swab     Status: None   Collection Time: 05/08/20  1:38 AM   Specimen: Nasopharyngeal Swab; Nasopharyngeal(NP) swabs in vial transport medium  Result Value Ref Range Status   SARS Coronavirus 2 by RT PCR NEGATIVE NEGATIVE Final    Comment: (NOTE) SARS-CoV-2 target nucleic acids are NOT DETECTED.  The  SARS-CoV-2 RNA is generally detectable in upper respiratory specimens during the acute phase of infection. The lowest concentration of SARS-CoV-2 viral copies this assay can detect is 138 copies/mL. A negative result does not preclude SARS-Cov-2 infection and should not be used as the sole basis for treatment or other patient management decisions. A negative result may occur with  improper specimen collection/handling, submission of specimen other than nasopharyngeal swab, presence of viral mutation(s) within the areas targeted by this assay, and inadequate number of viral copies(<138 copies/mL). A  negative result must be combined with clinical observations, patient history, and epidemiological information. The expected result is Negative.  Fact Sheet for Patients:  BloggerCourse.com  Fact Sheet for Healthcare Providers:  SeriousBroker.it  This test is no t yet approved or cleared by the [place] FDA and  has been authorized for detection and/or diagnosis of SARS-CoV-2 by FDA under an Emergency Use Authorization (EUA). This EUA will remain  in effect (meaning this test can be used) for the duration of the COVID-19 declaration under Section 564(b)(1) of the Act, 21 U.S.C.section 360bbb-3(b)(1), unless the authorization is terminated  or revoked sooner.       Influenza A by PCR NEGATIVE NEGATIVE Final   Influenza B by PCR NEGATIVE NEGATIVE Final    Comment: (NOTE) The Xpert Xpress SARS-CoV-2/FLU/RSV plus assay is intended as an aid in the diagnosis of influenza from Nasopharyngeal swab specimens and should not be used as a sole basis for treatment. Nasal washings and aspirates are unacceptable for Xpert Xpress SARS-CoV-2/FLU/RSV testing.  Fact Sheet for Patients: BloggerCourse.com  Fact Sheet for Healthcare Providers: SeriousBroker.it  This test is not yet approved or cleared by the [place] FDA and has been authorized for detection and/or diagnosis of SARS-CoV-2 by FDA under an Emergency Use Authorization (EUA). This EUA will remain in effect (meaning this test can be used) for the duration of the COVID-19 declaration under Section 564(b)(1) of the Act, 21 U.S.C. section 360bbb-3(b)(1), unless the authorization is terminated or revoked.  Performed at [facility], [address] [place], [place] [address]   Aerobic Culture w Gram Stain (superficial specimen)     Status: None   Collection Time: 05/08/20  4:54 AM   Specimen: Wound  Result Value Ref  Range Status   Specimen Description WOUND  Final   Special Requests FOOT RIGHT  Final   Gram Stain   Final    FEW WBC PRESENT,BOTH PMN AND MONONUCLEAR FEW GRAM POSITIVE COCCI Performed at [facility], [address] [place], [place] [address]    Culture   Final    ABUNDANT METHICILLIN RESISTANT STAPHYLOCOCCUS AUREUS   Report Status 05/10/2020 FINAL  Final   Organism ID, Bacteria METHICILLIN RESISTANT STAPHYLOCOCCUS AUREUS  Final      Susceptibility   Methicillin resistant staphylococcus aureus - MIC*    CIPROFLOXACIN >=8 RESISTANT Resistant     ERYTHROMYCIN >=8 RESISTANT Resistant     GENTAMICIN <=0.5 SENSITIVE Sensitive     OXACILLIN >=4 RESISTANT Resistant     TETRACYCLINE >=16 RESISTANT Resistant     VANCOMYCIN <=0.5 SENSITIVE Sensitive     TRIMETH/SULFA >=320 RESISTANT Resistant     CLINDAMYCIN <=0.25 SENSITIVE Sensitive     RIFAMPIN <=0.5 SENSITIVE Sensitive     Inducible Clindamycin NEGATIVE Sensitive     * ABUNDANT METHICILLIN RESISTANT STAPHYLOCOCCUS AUREUS  MRSA PCR Screening     Status: Abnormal   Collection Time: 05/09/20  7:41 AM   Specimen: Nasal  Mucosa; Nasopharyngeal  Result Value Ref Range Status   MRSA by PCR POSITIVE (A) NEGATIVE Final    Comment:        The GeneXpert MRSA Assay (FDA approved for NASAL specimens only), is one component of a comprehensive MRSA colonization surveillance program. It is not intended to diagnose MRSA infection nor to guide or monitor treatment for MRSA infections. RESULT CALLED TO, READ BACK BY AND VERIFIED WITH: RN MARISSA [phone] Performed at [facility], [address] [place], [place] [address]   Aerobic/Anaerobic Culture w Gram Stain (surgical/deep wound)     Status: None   Collection Time: 05/09/20  9:18 AM   Specimen: PATH Soft tissue  Result Value Ref Range Status   Specimen Description TISSUE  Final   Special Requests FOOT RIGHT  Final   Gram Stain NO WBC SEEN NO ORGANISMS SEEN   Final    Culture   Final    MODERATE METHICILLIN RESISTANT STAPHYLOCOCCUS AUREUS CRITICAL RESULT CALLED TO, READ BACK BY AND VERIFIED WITH: RN DOROTHY M. [id number] FCP NO ANAEROBES ISOLATED Performed at [facility], [address] [place], [place] [address]    Report Status 05/14/2020 FINAL  Final   Organism ID, Bacteria METHICILLIN RESISTANT STAPHYLOCOCCUS AUREUS  Final      Susceptibility   Methicillin resistant staphylococcus aureus - MIC*    CIPROFLOXACIN >=8 RESISTANT Resistant     ERYTHROMYCIN >=8 RESISTANT Resistant     GENTAMICIN <=0.5 SENSITIVE Sensitive     OXACILLIN >=4 RESISTANT Resistant     TETRACYCLINE >=16 RESISTANT Resistant     VANCOMYCIN <=0.5 SENSITIVE Sensitive     TRIMETH/SULFA >=320 RESISTANT Resistant     CLINDAMYCIN <=0.25 SENSITIVE Sensitive     RIFAMPIN <=0.5 SENSITIVE Sensitive     Inducible Clindamycin NEGATIVE Sensitive     * MODERATE METHICILLIN RESISTANT STAPHYLOCOCCUS AUREUS         Radiology Studies: DG Knee 1-2 Views Right  Result Date: 05/14/2020 CLINICAL DATA:  Right knee pain and swelling. Recent right foot surgery. EXAM: RIGHT KNEE - 1-2 VIEW COMPARISON:  None. FINDINGS: No fracture or dislocation. 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 Tibiofemoral alignment is maintained. Moderate chondrocalcinosis. There is a moderate knee joint effusion. Advanced vascular calcifications. No fracture, erosion, or bony destruction. IMPRESSION: 1. Moderate knee joint effusion. 2. No acute osseous abnormality. 3. Patellofemoral osteoarthritis.  Chondrocalcinosis. 4. Advanced vascular calcifications. Electronically Signed   By: Narda RutherfordMelanie  Sanford M.D.   On: 05/14/2020 15:17   DG CHEST PORT 1 VIEW  Result Date: 05/13/2020 CLINICAL DATA:  Wheezing EXAM: PORTABLE CHEST 1 VIEW COMPARISON:  Portable exam 1659 hours compared to 05/07/2020 FINDINGS: LEFT subclavian pacemaker leads project over RIGHT atrium and RIGHT ventricle  unchanged. Accentuation of heart size. Atherosclerotic calcification and tortuosity of thoracic aorta. Pulmonary vascularity normal. Mild LEFT basilar atelectasis. No definite infiltrate, pleural effusion or pneumothorax. Bones demineralized with advanced RIGHT glenohumeral degenerative changes. IMPRESSION: RIGHT basilar atelectasis. Aortic Atherosclerosis (ICD10-I70.0). Electronically Signed   By: Ulyses SouthwardMark  Boles M.D.   On: 05/13/2020 17:44        Scheduled Meds: . docusate sodium  100 mg Oral BID  . enoxaparin (LOVENOX) injection  40 mg Subcutaneous Q24H  . feeding supplement  237 mL Oral TID BM  . gabapentin  300 mg Oral QHS  . ipratropium-albuterol  3 mL Nebulization TID AC  . metoprolol tartrate  25 mg Oral Daily  . metroNIDAZOLE  500 mg  Oral Q8H  . multivitamin with minerals  1 tablet Oral Daily  . tamsulosin  0.4 mg Oral Daily   Continuous Infusions: . vancomycin 750 mg (05/14/20 1443)     LOS: 6 days   Time spent: Greater than 50% of this time was spent in counseling, explanation of diagnosis, planning of further management, and coordination of care.   Voice Recognition Reubin Milan dictation system was used to create this note, attempts have been made to correct errors. Please contact the author with questions and/or clarifications.   Albertine Grates, MD PhD FACP [facility]  Available via Epic secure chat 7am-7pm for nonurgent issues Please page for urgent issues To page the attending provider between 7A-7P or the covering provider during after hours 7P-7A, please log into the web site [email or web address] and access using universal [place] password for that web site. If you do not have the password, please call the hospital operator.    05/14/2020, 5:12 PM

## 2020-05-14 NOTE — Care Management Important Message (Signed)
Important Message  Patient Details  Name: Dylan Harrington MRN: [id number] Date of Birth: Jul 12, 1940   Medicare Important Message Given:  Yes - Important Message mailed due to current National Emergency   Verbal consent obtained due to current National Emergency  Relationship to patient: Self Contact Name: Haji Call Date: 05/14/20  Time: 1118 Phone: [phone] Outcome: No Answer/Busy Important Message mailed to: Patient address on file    [name] 05/14/2020, 11:18 AM

## 2020-05-14 NOTE — TOC Progression Note (Addendum)
Transition of Care [facility]) - Progression Note    Patient Details  Name: Dylan Harrington MRN: [id number] Date of Birth: 10/11/1940  [facility] CM/SW Contact  Erin Sons, [place] Phone Number: 05/14/2020, 4:53 PM  Clinical Narrative:     CSW notified by [facility] that pt daughter now wants SNF's in [place]. CSW is notified that Pt's daughter requesting SNF facilities in [place]; requesting referrals to the following facilities:  [facility] [employer]; no anser left voicemail requesting return call  [facility] on [address]- left voicemail with admission requesting return call  [employer] [facility]; CSW is given cell # for admissions of [phone]; no answer, left voicemail. CSW faxes referral to [phone]  [facility]; left message with admissions requesting return call; fax sent to [phone]  [facility]; notified admissions of referral; fax sent to [phone]  Called [facility] SNF; no answer, left voicemail requesting return call.   Expected Discharge Plan: Home w Home Health Services Barriers to Discharge: Continued Medical Work up  Expected Discharge Plan and Services Expected Discharge Plan: Home w Home Health Services   Discharge Planning Services: CM Consult Post Acute Care Choice: Skilled Nursing Facility Living arrangements for the past 2 months: Single Family Home                 DME Arranged: Wheelchair manual DME Agency: [employer] Date DME Agency Contacted: 05/14/20 Time DME Agency Contacted: [phone] Representative spoke with at DME Agency: [employer] HH Arranged: PT,OT,RN,Nurse's Aide,Social Work [employer] Agency: [facility] Date [facility] Agency Contacted: 05/13/20 Time HH Agency Contacted: 1450 Representative spoke with at [facility] Agency: Kandee Keen   Social Determinants of Health (SDOH) Interventions    Readmission Risk  Interventions No flowsheet data found.

## 2020-05-14 NOTE — Plan of Care (Signed)

## 2020-05-14 NOTE — Progress Notes (Signed)
IVT consulted for difficult IV placement.  RN going to assess first and if IVT is needed, will place new consult.

## 2020-05-14 NOTE — Progress Notes (Signed)
Physical Therapy Treatment Patient Details Name: AIRON Harrington MRN: [id number] DOB: Apr 06, 1940 Today's Date: 05/14/2020    History of Present Illness The pt is a 80 yo male presentingto ED on 3/24 with x1 week of R foot wound with drainage. Pt found to have infection , and is now s/p I &D of R foot abcess with placement of wound vac on 3/26. PMH includes: COPD, glaucoma, HTN, OSA, spine, knee, and shoulder surgery.    PT Comments    Pt supine in bed on arrival just waking.  Very lethargic during session and presents with difficulty following commands this session.  Pt unable to maintain weight bearing.  Pt refusing SNF placement with plans to return home to his daughters house.  Pt will require HHPT at DME listed below.  Will continue to follow patient per POC.   Follow Up Recommendations  SNF;Supervision for mobility/OOB (snf remains appropriate but family and patient choosing home so will require HHPT.)     Equipment Recommendations  Rolling walker with 5" wheels;Wheelchair cushion (measurements PT);Wheelchair (measurements PT);3in1 (PT)    Recommendations for Other Services       Precautions / Restrictions Precautions Precautions: Fall Precaution Comments: wound vac Required Braces or Orthoses: Other Brace Other Brace: post-op shoe Restrictions Weight Bearing Restrictions: Yes RLE Weight Bearing: Non weight bearing    Mobility  Bed Mobility Overal bed mobility: Needs Assistance Bed Mobility: Rolling;Sidelying to Sit   Sidelying to sit: Supervision;HOB elevated       General bed mobility comments: supervision for line management and to avoid use of RLE, pt able to complete without assist, used bed rail and elevated HOB    Transfers Overall transfer level: Needs assistance Equipment used: Rolling walker (2 wheeled) Transfers: Sit to/from Stand Sit to Stand: Min assist         General transfer comment: Cues for hand placement and weight bearing.  Increased time  to rise into standing this session.  Pt touching R foot down for balance with max VCs to keep foot off the floor.  Ambulation/Gait Ambulation/Gait assistance: Mod assist;+2 safety/equipment Gait Distance (Feet): 4 Feet Assistive device: Rolling walker (2 wheeled) Gait Pattern/deviations: Step-to pattern;Trunk flexed     General Gait Details: Cues for RW safety and use of UEs to produce hop steps.  Pt pushing device too far forward and required mod assistance to keep device close.  He continues to fatigue quickly and unable to maintain weight bearing this session so further gt deferred.   Stairs             Wheelchair Mobility    Modified Rankin (Stroke Patients Only)       Balance Overall balance assessment: Needs assistance Sitting-balance support: Feet supported;No upper extremity supported Sitting balance-Leahy Scale: Good       Standing balance-Leahy Scale: Poor                              Cognition Arousal/Alertness: Awake/alert Behavior During Therapy: WFL for tasks assessed/performed;Impulsive Overall Cognitive Status: Impaired/Different from baseline Area of Impairment: Following commands;Safety/judgement;Problem solving                     Memory: Decreased recall of precautions;Decreased short-term memory Following Commands: Follows one step commands with increased time;Follows one step commands inconsistently Safety/Judgement: Decreased awareness of safety;Decreased awareness of deficits   Problem Solving: Slow processing;Difficulty sequencing;Requires verbal cues;Requires tactile cues General Comments: Required re-education  on precautions and frequent cueing for safety.  pt having a difficult time maintaining weight bearing this session.      Exercises General Exercises - Lower Extremity Ankle Circles/Pumps: AROM;Both;10 reps;AAROM;Supine Quad Sets: AROM;10 reps;Supine Long Arc Quad: AROM;Both;10 reps;Seated    General  Comments        Pertinent Vitals/Pain Pain Assessment: Faces Faces Pain Scale: Hurts little more Pain Location: R foot. Pain Descriptors / Indicators: Discomfort;Grimacing Pain Intervention(s): Monitored during session;Repositioned    Home Living                      Prior Function            PT Goals (current goals can now be found in the care plan section) Acute Rehab PT Goals Patient Stated Goal: return home to wife Potential to Achieve Goals: Fair Progress towards PT goals: Progressing toward goals    Frequency    Min 4X/week (will keep 4x week as patient is refusing SNF placement.)      PT Plan Current plan remains appropriate    Co-evaluation              AM-PAC PT "6 Clicks" Mobility   Outcome Measure  Help needed turning from your back to your side while in a flat bed without using bedrails?: None Help needed moving from lying on your back to sitting on the side of a flat bed without using bedrails?: A Little Help needed moving to and from a bed to a chair (including a wheelchair)?: A Lot Help needed standing up from a chair using your arms (e.g., wheelchair or bedside chair)?: A Lot Help needed to walk in hospital room?: A Lot Help needed climbing 3-5 steps with a railing? : A Lot 6 Click Score: 15    End of Session Equipment Utilized During Treatment: Gait belt Activity Tolerance: Patient tolerated treatment well Patient left: with call bell/phone within reach;in chair;with chair alarm set Nurse Communication: Mobility status PT Visit Diagnosis: Unsteadiness on feet (R26.81);Other abnormalities of gait and mobility (R26.89);Muscle weakness (generalized) (M62.81)     Time: 1884-1660 PT Time Calculation (min) (ACUTE ONLY): 11 min  Charges:  $Therapeutic Activity: 8-22 mins                     Dylan Harrington , PTA Acute Rehabilitation Services Pager [phone] Office [phone]     Dylan Harrington 05/14/2020, 2:53 PM

## 2020-05-14 NOTE — Progress Notes (Cosign Needed)
    Durable Medical Equipment  (From admission, onward)         Start     Ordered   05/14/20 1555  For home use only DME lightweight manual wheelchair with seat cushion  Once       Comments: Patient suffers from I &D of R foot abcess with placement of wound vac which impairs their ability to perform daily activities like walking in the home.  A rolling walker will not resolve  issue with performing activities of daily living. A wheelchair will allow patient to safely perform daily activities. Patient is not able to propel themselves in the home using a standard weight wheelchair due to weakness. Patient can self propel in the lightweight wheelchair. Length of need 12 months Accessories: elevating leg rests (ELRs), wheel locks, extensions and anti-tippers.   05/14/20 1556

## 2020-05-15 DIAGNOSIS — L03115 Cellulitis of right lower limb: Secondary | ICD-10-CM | POA: Diagnosis not present

## 2020-05-15 LAB — CBC WITH DIFFERENTIAL/PLATELET
Abs Immature Granulocytes: 0.09 10*3/uL — ABNORMAL HIGH (ref 0.00–0.07)
Basophils Absolute: 0 10*3/uL (ref 0.0–0.1)
Basophils Relative: 0 %
Eosinophils Absolute: 0 10*3/uL (ref 0.0–0.5)
Eosinophils Relative: 0 %
HCT: 32.1 % — ABNORMAL LOW (ref 39.0–52.0)
Hemoglobin: 10.4 g/dL — ABNORMAL LOW (ref 13.0–17.0)
Immature Granulocytes: 1 %
Lymphocytes Relative: 22 %
Lymphs Abs: 1.6 10*3/uL (ref 0.7–4.0)
MCH: 32.3 pg (ref 26.0–34.0)
MCHC: 32.4 g/dL (ref 30.0–36.0)
MCV: 99.7 fL (ref 80.0–100.0)
Monocytes Absolute: 0.9 10*3/uL (ref 0.1–1.0)
Monocytes Relative: 12 %
Neutro Abs: 4.5 10*3/uL (ref 1.7–7.7)
Neutrophils Relative %: 65 %
Platelets: 243 10*3/uL (ref 150–400)
RBC: 3.22 MIL/uL — ABNORMAL LOW (ref 4.22–5.81)
RDW: 14.3 % (ref 11.5–15.5)
WBC: 7 10*3/uL (ref 4.0–10.5)
nRBC: 0 % (ref 0.0–0.2)

## 2020-05-15 LAB — MAGNESIUM: Magnesium: 2 mg/dL (ref 1.7–2.4)

## 2020-05-15 LAB — BASIC METABOLIC PANEL
Anion gap: 7 (ref 5–15)
BUN: 19 mg/dL (ref 8–23)
CO2: 24 mmol/L (ref 22–32)
Calcium: 7.6 mg/dL — ABNORMAL LOW (ref 8.9–10.3)
Chloride: 106 mmol/L (ref 98–111)
Creatinine, Ser: 0.92 mg/dL (ref 0.61–1.24)
GFR, Estimated: >60 mL/min (ref >60)
Glucose, Bld: 100 mg/dL — ABNORMAL HIGH (ref 70–99)
Potassium: 3.6 mmol/L (ref 3.5–5.1)
Sodium: 137 mmol/L (ref 135–145)

## 2020-05-15 LAB — URIC ACID: Uric Acid, Serum: 4.9 mg/dL (ref 3.7–8.6)

## 2020-05-15 LAB — C-REACTIVE PROTEIN: CRP: 10.4 mg/dL — ABNORMAL HIGH (ref <1.0)

## 2020-05-15 LAB — SEDIMENTATION RATE: Sed Rate: 104 mm/hr — ABNORMAL HIGH (ref 0–16)

## 2020-05-15 LAB — SARS CORONAVIRUS 2 (TAT 6-24 HRS): SARS Coronavirus 2: NEGATIVE

## 2020-05-15 MED ORDER — IPRATROPIUM-ALBUTEROL 0.5-2.5 (3) MG/3ML IN SOLN
3.0000 mL | Freq: Two times a day (BID) | RESPIRATORY_TRACT | Status: DC
  Administered 2020-05-15 – 2020-05-17 (×4): 3 mL via RESPIRATORY_TRACT
  Filled 2020-05-15 (×5): qty 3

## 2020-05-15 NOTE — TOC Progression Note (Addendum)
Transition of Care [facility]) - Progression Note    Patient Details  Name: Dylan Harrington MRN: [id number] Date of Birth: 31-Jul-1940  Transition of Care [facility] CM/SW Contact  Dylan Harrington, [place] Phone Number: 05/15/2020, 11:29 AM  Clinical Narrative:     CSW called pt's Daughter Dylan Harrington to update that 4 of the 6 facilities that she was interested do not have availability until at earliest sometime next week. CSW informed her that he is still waiting on response from both [facility] and Wellness facilities. Daughter stated that the doctor informed her that pt would likely be here until next week; CSW stated he would follow up with attending MD about anticipated discharge date. Daughter asked if there were additional facilities in [place] that CSW could submit referrals to. CSW informed Dylan Harrington that if she identified additional facilities that she would like referrals sent to that CSW would send referral. Dylan Harrington expressed frustration and stated that she is at work and will be unable to look at [facility] SNF list until tomorrow. CSW provided instructions on how she could access Medicare SNF list through [email or web address]. She continued to explain that she would be unable to identify additional facilities until tomorrow. Dylan Harrington complaints that CSW informed her on 3/30 that pt would likely discharge on 3/31. CSW explained that 3/31 was pt's anticipated d/c date until MD wanted to keep pt incase of possible knee infection. At this point Dylan Harrington is making complaints about discharge planning process and that she was not notified about pt discharge cancellation on 3/31. She also complains that CSW that CSW said she could do [facility] and that pt is not HH appropriate (CSW only agreed to pursue [facility] because that is what pt daughter requesting and pt and pt daughter discussed and decided on while on speaker phone through CSW's phone with CSW present). CSW offered to provide contact details for his supervisor. Mrs.  Dylan Harrington stated she already talked to CSW's supervisor. CSW explained she may have spoken to the [facility] Nursing director but that he could provide Social Work Merchandiser, retail. She did not want this at this time but stated she would be contacting "patient relations" to make complaint.    Expected Discharge Plan: Home w Home Health Services Barriers to Discharge: Continued Medical Work up  Expected Discharge Plan and Services Expected Discharge Plan: Home w Home Health Services   Discharge Planning Services: CM Consult Post Acute Care Choice: Skilled Nursing Facility Living arrangements for the past 2 months: Single Family Home                 DME Arranged: Wheelchair manual DME Agency: [employer] Date DME Agency Contacted: 05/14/20 Time DME Agency Contacted: [phone] Representative spoke with at DME Agency: [employer] HH Arranged: PT,OT,RN,Nurse's Aide,Social Work [employer] Agency: [facility] Date [facility] Agency Contacted: 05/13/20 Time HH Agency Contacted: [phone] Representative spoke with at [facility] Agency: Kandee Keen   Social Determinants of Health (SDOH) Interventions    Readmission Risk Interventions No flowsheet data found.

## 2020-05-15 NOTE — Progress Notes (Signed)
Patient is a pleasant 80 year old gentleman who is postop day 6 irrigation and debridement of right foot abscess.  He is much more awake and responds to questions appropriately today.  Was asked by hospitalist to evaluate his right knee.  Apparently he had some increased pain and swelling in his right knee.  He has had a history of knee surgery on his left knee.  He says right knee feels better this morning and that he has had trouble with this knee on and off for a while   Vital signs stable afebrile wound VAC is functioning well.  Examination of his right knee he does not have any cellulitis any erythema he does have some mild soft tissue swelling when compared to the unaffected knee.  Knee temperature is the same as his other knee.  He has minimal tenderness to extension and flexion.  Cannot palpate any significant effusion.  No ascending cellulitis  X-ray of his right knee was reviewed today by myself and Dr. 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  Also him some increased calcified markings in his posterior thigh.  Do not see any infective process going on in his knee at this time.  Would not recommend aspiration.  Can follow-up with [place] on an outpatient basis.

## 2020-05-15 NOTE — Progress Notes (Addendum)
PROGRESS NOTE    Dylan Harrington  RUE:[id number] DOB: 10-15-1940 DOA: 05/07/2020 PCP: Winifred Olive, MD    Chief Complaint  Patient presents with  . Foot Problem  . Wound Infection    Brief Narrative:   80yo w/ a hx of COPD, OSA, HTN, BPH, tobacco abuse, and neuropathy who presented to the ER with worsening right foot pain after having stepped on sharp object 10 days prior resulting in a wound to the bottom of his foot.  He had progressive erythema and purulent drainage despite use of Bactrim in the outpatient setting per his PCP.  Following admission the patient was treated with IV vancomycin and meropenem.  He underwent excisional debridement and wound VAC placement per Dr. Lajoyce Corners 3/26.  Superficial wound cultures revealed MRSA.  ID directing antibiotic therapy.  Subjective:  tmax 99.4, reports less  pain in right knee today, right knee appears less swollen today Appear slow to answer questions, not oriented to the year, but to the month and place, daughter states this is not his  baseline No wheezing today, he does appear to have dyspnea when talking but he denies feeling sob, denies difficulty breathing, states this is his baseline  He remains very weak and  relies on heavy assistance for ADLs per PT   Wound vac to right foot  External urinary catheter with concentrated urine  He again declined  cpap last night  Assessment & Plan:   Principal Problem:   Cellulitis of right foot Active Problems:   COPD (chronic obstructive pulmonary disease) (HCC)   Essential hypertension   Skin ulcer of plantar aspect of foot (HCC)   Nicotine dependence, cigarettes, uncomplicated   BPH without urinary obstruction   Lactic acidosis   Hypokalemia   Cutaneous abscess of right foot   Protein-calorie malnutrition, severe   Foot abscess, right   Right foot cellulitis/abscess, sepsis present on admission -Received Tdap vaccine during this hospitalization (stepped on sharp object 10  days prior resulting in a wound to the bottom of his foot.) -Status post I&D March 26 -Currently on vancomycin and Flagyl, Plan to discharge on Zyvox, has appointment with ID on May 26, 2020 at 3:45 PM -Per Dr. Lajoyce Corners patient can discharge with prevena plus portable pump, non weight bearing right foot, f/u with Dr. Lajoyce Corners in 1 week   Possible prediabetes A1c was checked on admission, A1c is 6.1% Lenient control in a 80 year old patient  Right knee pain Concerning for joint fluids Knee x ray + effusion Evaluated by ortho, recommend outpatient follow up, details please see ortho note from 4/1 am  AKI on CKD 2 -Resolved,  -Home medication lisinopril HCTZ held since admission  Hypertension Blood pressure stable on metoprolol, continue hold lisinopril HCTZ  COPD/tobacco use -Mild wheezing heard on exam today, will get chest x-ray, add on DuoNeb -Encourage smoking cessation  BPH Continue Flomax  Peripheral neuropathy Continue Neurontin  OSA Encourage CPAP compliance He declined CPAP last night  Nutritional Assessment: The patient's BMI is: Body mass index is 28.2 kg/m.Marland KitchenSeen by dietician.  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     Unresulted Labs (From admission, onward)         None  DVT prophylaxis: SCDs Start: 05/09/20 1054 enoxaparin (LOVENOX) injection 40 mg Start: 05/08/20 1000   Code Status: Full Family Communication: daughter at bedside on 3/31, over the phone on 4/1 Disposition:   Status is: Inpatient   Dispo: The patient is  from: home              Anticipated d/c is to: SNF versus home health              Anticipated d/c date is: to be determined                 Consultants:   Orthopedics  ID  Procedures:  Status post I&D March 26 right foot  Antimicrobials:    Anti-infectives (From admission, onward)   Start     Dose/Rate Route Frequency Ordered Stop   05/11/20 1400  metroNIDAZOLE (FLAGYL) tablet 500 mg        500 mg Oral Every 8 hours 05/11/20 1129     05/08/20 1000  vancomycin (VANCOREADY) IVPB 750 mg/150 mL        750 mg 150 mL/hr over 60 Minutes Intravenous Every 12 hours 05/08/20 0213     05/08/20 0215  meropenem (MERREM) 1 g in sodium chloride 0.9 % 100 mL IVPB  Status:  Discontinued        1 g 200 mL/hr over 30 Minutes Intravenous Every 8 hours 05/08/20 0211 05/11/20 1129   05/08/20 0215  vancomycin (VANCOREADY) IVPB 1500 mg/300 mL        1,500 mg 150 mL/hr over 120 Minutes Intravenous  Once 05/08/20 0211 05/08/20 0507         Objective: Vitals:   05/14/20 1607 05/14/20 2023 05/15/20 0803 05/15/20 0858  BP: 132/64 112/68  119/71  Pulse: 98 82  85  Resp: (!) 24 (!) 22  17  Temp: 98.6 F (37 C) 99.4 F (37.4 C)  98 F (36.7 C)  TempSrc: Oral Oral  Oral  SpO2: 92% 93% 94% 94%  Weight:      Height:        Intake/Output Summary (Last 24 hours) at 05/15/2020 1549 Last data filed at 05/15/2020 0900 Gross per 24 hour  Intake --  Output 0 ml  Net 0 ml   Filed Weights   05/07/20 2308 05/08/20 1355  Weight: 94.3 kg 94.3 kg    Examination:  General exam: frail elderly, NAD Respiratory system: no wheezing today,  Respiratory effort normal. Cardiovascular system: S1 & S2 heard, RRR. No JVD, no murmur, No pedal edema. Gastrointestinal system: Abdomen is nondistended, soft and nontender.  Normal bowel sounds heard. Central nervous system: Alert and oriented. No focal neurological deficits. Extremities: generalized weakness, right foot post op changes, dressing and wound vac in  place, right knee tender, warm to touch, swollen, no erythema  Skin: No rashes, lesions or ulcers Psychiatry: appear slightly confused, not oriented to the year, but calm and cooperative     Data Reviewed: I have personally reviewed following labs and imaging studies  CBC: Recent Labs  Lab 05/09/20 0301 05/10/20 0136 05/11/20 0831 05/14/20 0258 05/15/20 0334  WBC 9.8 8.7 6.5 8.5 7.0  NEUTROABS  --   --   --  5.8 4.5  HGB 12.6* 11.3* 11.6* 11.0* 10.4*  HCT 37.5* 35.0* 35.7* 33.9* 32.1*  MCV 96.2 99.2 98.9 97.7 99.7  PLT 275 269 251 264 243    Basic Metabolic Panel: Recent Labs  Lab 05/09/20 0301 05/10/20 0136 05/11/20 0831 05/14/20 0258 05/15/20  0334  NA 135 133* 135 136 137  K 4.3 4.4 3.9 3.6 3.6  CL 100 99 102 106 106  CO2 GLUCOSE 96 112* 169* 147* 100*  BUN CREATININE 1.10 1.17 0.92 1.01 0.92  CALCIUM 8.5* 7.9* 8.0* 7.9* 7.6*  MG 1.5* 1.8 2.0 1.9 2.0  PHOS  --  3.1 2.0*  --   --     GFR: Estimated Creatinine Clearance: 77.6 mL/min (by C-G formula based on SCr of 0.92 mg/dL).  Liver Function Tests: Recent Labs  Lab 05/10/20 0136 05/11/20 0831 05/14/20 0258  AST  --   --  20  ALT  --   --  12  ALKPHOS  --   --  38  BILITOT  --   --  1.0  PROT  --   --  6.9  ALBUMIN 2.1* 2.0* 1.8*    CBG: No results for input(s): GLUCAP in the last 168 hours.   Recent Results (from the past 240 hour(s))  Culture, blood (Routine x 2)     Status: None   Collection Time: 05/07/20 11:00 PM   Specimen: BLOOD LEFT ARM  Result Value Ref Range Status   Specimen Description BLOOD LEFT ARM  Final   Special Requests   Final    BOTTLES DRAWN AEROBIC AND ANAEROBIC Blood Culture adequate volume   Culture   Final    NO GROWTH 5 DAYS Performed at [facility], [address], [place], [place] [address]    Report Status 05/13/2020 FINAL  Final  Culture, blood (Routine x 2)     Status: None   Collection Time: 05/07/20 11:27 PM    Specimen: BLOOD RIGHT ARM  Result Value Ref Range Status   Specimen Description BLOOD RIGHT ARM  Final   Special Requests AEROBIC BOTTLE ONLY Blood Culture adequate volume  Final   Culture   Final    NO GROWTH 5 DAYS Performed at [facility], [address], [place], [place] [address]    Report Status 05/13/2020 FINAL  Final  Resp Panel by RT-PCR (Flu A&B, Covid) Nasopharyngeal Swab     Status: None   Collection Time: 05/08/20  1:38 AM   Specimen: Nasopharyngeal Swab; Nasopharyngeal(NP) swabs in vial transport medium  Result Value Ref Range Status   SARS Coronavirus 2 by RT PCR NEGATIVE NEGATIVE Final    Comment: (NOTE) SARS-CoV-2 target nucleic acids are NOT DETECTED.  The SARS-CoV-2 RNA is generally detectable in upper respiratory specimens during the acute phase of infection. The lowest concentration of SARS-CoV-2 viral copies this assay can detect is 138 copies/mL. A negative result does not preclude SARS-Cov-2 infection and should not be used as the sole basis for treatment or other patient management decisions. A negative result may occur with  improper specimen collection/handling, submission of specimen other than nasopharyngeal swab, presence of viral mutation(s) within the areas targeted by this assay, and inadequate number of viral copies(<138 copies/mL). A negative result must be combined with clinical observations, patient history, and epidemiological information. The expected result is Negative.  Fact Sheet for Patients:  BloggerCourse.com  Fact Sheet for Healthcare Providers:  SeriousBroker.it  This test is no t yet approved or cleared by the [place] FDA and  has been authorized for detection and/or diagnosis of SARS-CoV-2 by FDA under an Emergency Use Authorization (EUA). This EUA will remain  in effect (meaning this test can be used)  for the duration of the COVID-19 declaration under Section  564(b)(1) of the Act, 21 U.S.C.section 360bbb-3(b)(1), unless the authorization is terminated  or revoked sooner.       Influenza A by PCR NEGATIVE NEGATIVE Final   Influenza B by PCR NEGATIVE NEGATIVE Final    Comment: (NOTE) The Xpert Xpress SARS-CoV-2/FLU/RSV plus assay is intended as an aid in the diagnosis of influenza from Nasopharyngeal swab specimens and should not be used as a sole basis for treatment. Nasal washings and aspirates are unacceptable for Xpert Xpress SARS-CoV-2/FLU/RSV testing.  Fact Sheet for Patients: BloggerCourse.com  Fact Sheet for Healthcare Providers: SeriousBroker.it  This test is not yet approved or cleared by the [place] FDA and has been authorized for detection and/or diagnosis of SARS-CoV-2 by FDA under an Emergency Use Authorization (EUA). This EUA will remain in effect (meaning this test can be used) for the duration of the COVID-19 declaration under Section 564(b)(1) of the Act, 21 U.S.C. section 360bbb-3(b)(1), unless the authorization is terminated or revoked.  Performed at [facility], [address] [place], [place] [address]   Aerobic Culture w Gram Stain (superficial specimen)     Status: None   Collection Time: 05/08/20  4:54 AM   Specimen: Wound  Result Value Ref Range Status   Specimen Description WOUND  Final   Special Requests FOOT RIGHT  Final   Gram Stain   Final    FEW WBC PRESENT,BOTH PMN AND MONONUCLEAR FEW GRAM POSITIVE COCCI Performed at [facility], [address] [place], [place] [address]    Culture   Final    ABUNDANT METHICILLIN RESISTANT STAPHYLOCOCCUS AUREUS   Report Status 05/10/2020 FINAL  Final   Organism ID, Bacteria METHICILLIN RESISTANT STAPHYLOCOCCUS AUREUS  Final      Susceptibility   Methicillin resistant staphylococcus aureus - MIC*    CIPROFLOXACIN >=8 RESISTANT Resistant     ERYTHROMYCIN >=8 RESISTANT Resistant      GENTAMICIN <=0.5 SENSITIVE Sensitive     OXACILLIN >=4 RESISTANT Resistant     TETRACYCLINE >=16 RESISTANT Resistant     VANCOMYCIN <=0.5 SENSITIVE Sensitive     TRIMETH/SULFA >=320 RESISTANT Resistant     CLINDAMYCIN <=0.25 SENSITIVE Sensitive     RIFAMPIN <=0.5 SENSITIVE Sensitive     Inducible Clindamycin NEGATIVE Sensitive     * ABUNDANT METHICILLIN RESISTANT STAPHYLOCOCCUS AUREUS  MRSA PCR Screening     Status: Abnormal   Collection Time: 05/09/20  7:41 AM   Specimen: Nasal Mucosa; Nasopharyngeal  Result Value Ref Range Status   MRSA by PCR POSITIVE (A) NEGATIVE Final    Comment:        The GeneXpert MRSA Assay (FDA approved for NASAL specimens only), is one component of a comprehensive MRSA colonization surveillance program. It is not intended to diagnose MRSA infection nor to guide or monitor treatment for MRSA infections. RESULT CALLED TO, READ BACK BY AND VERIFIED WITH: RN MARISSA [phone] MLM Performed at [facility], [address] [place], [place] [address]   Aerobic/Anaerobic Culture w Gram Stain (surgical/deep wound)     Status: None   Collection Time: 05/09/20  9:18 AM   Specimen: PATH Soft tissue  Result Value Ref Range Status   Specimen Description TISSUE  Final   Special Requests FOOT RIGHT  Final   Gram Stain NO WBC SEEN NO ORGANISMS SEEN   Final   Culture   Final    MODERATE METHICILLIN RESISTANT STAPHYLOCOCCUS AUREUS  CRITICAL RESULT CALLED TO, READ BACK BY AND VERIFIED WITH: RN DOROTHY M. [phone] FCP NO ANAEROBES ISOLATED Performed at [facility], [address], [place], [place] [address]    Report Status 05/14/2020 FINAL  Final   Organism ID, Bacteria METHICILLIN RESISTANT STAPHYLOCOCCUS AUREUS  Final      Susceptibility   Methicillin resistant staphylococcus aureus - MIC*    CIPROFLOXACIN >=8 RESISTANT Resistant     ERYTHROMYCIN >=8 RESISTANT Resistant     GENTAMICIN <=0.5 SENSITIVE Sensitive     OXACILLIN >=4 RESISTANT  Resistant     TETRACYCLINE >=16 RESISTANT Resistant     VANCOMYCIN <=0.5 SENSITIVE Sensitive     TRIMETH/SULFA >=320 RESISTANT Resistant     CLINDAMYCIN <=0.25 SENSITIVE Sensitive     RIFAMPIN <=0.5 SENSITIVE Sensitive     Inducible Clindamycin NEGATIVE Sensitive     * MODERATE METHICILLIN RESISTANT STAPHYLOCOCCUS AUREUS  SARS CORONAVIRUS 2 (TAT 6-24 HRS) Nasopharyngeal Nasopharyngeal Swab     Status: None   Collection Time: 05/14/20 10:12 PM   Specimen: Nasopharyngeal Swab  Result Value Ref Range Status   SARS Coronavirus 2 NEGATIVE NEGATIVE Final    Comment: (NOTE) SARS-CoV-2 target nucleic acids are NOT DETECTED.  The SARS-CoV-2 RNA is generally detectable in upper and lower respiratory specimens during the acute phase of infection. Negative results do not preclude SARS-CoV-2 infection, do not rule out co-infections with other pathogens, and should not be used as the sole basis for treatment or other patient management decisions. Negative results must be combined with clinical observations, patient history, and epidemiological information. The expected result is Negative.  Fact Sheet for Patients: HairSlick.no  Fact Sheet for Healthcare Providers: quierodirigir.com  This test is not yet approved or cleared by the [place] FDA and  has been authorized for detection and/or diagnosis of SARS-CoV-2 by FDA under an Emergency Use Authorization (EUA). This EUA will remain  in effect (meaning this test can be used) for the duration of the COVID-19 declaration under Se ction 564(b)(1) of the Act, 21 U.S.C. section 360bbb-3(b)(1), unless the authorization is terminated or revoked sooner.  Performed at [facility], [address], [place], [place] [address]          Radiology Studies: DG Knee 1-2 Views Right  Result Date: 05/14/2020 CLINICAL DATA:  Right knee pain and swelling. Recent right foot surgery. EXAM:  RIGHT KNEE - 1-2 VIEW COMPARISON:  None. FINDINGS: No fracture or dislocation. 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 Tibiofemoral alignment is maintained. Moderate chondrocalcinosis. There is a moderate knee joint effusion. Advanced vascular calcifications. No fracture, erosion, or bony destruction. IMPRESSION: 1. Moderate knee joint effusion. 2. No acute osseous abnormality. 3. Patellofemoral osteoarthritis.  Chondrocalcinosis. 4. Advanced vascular calcifications. Electronically Signed   By: Narda Rutherford M.D.   On: 05/14/2020 15:17   DG CHEST PORT 1 VIEW  Result Date: 05/13/2020 CLINICAL DATA:  Wheezing EXAM: PORTABLE CHEST 1 VIEW COMPARISON:  Portable exam 1659 hours compared to 05/07/2020 FINDINGS: LEFT subclavian pacemaker leads project over RIGHT atrium and RIGHT ventricle unchanged. Accentuation of heart size. Atherosclerotic calcification and tortuosity of thoracic aorta. Pulmonary vascularity normal. Mild LEFT basilar atelectasis. No definite infiltrate, pleural effusion or pneumothorax. Bones demineralized with advanced RIGHT glenohumeral degenerative changes. IMPRESSION: RIGHT basilar atelectasis. Aortic Atherosclerosis (ICD10-I70.0). Electronically Signed   By: Ulyses Southward M.D.   On: 05/13/2020 17:44        Scheduled Meds: . docusate sodium  100 mg Oral  BID  . enoxaparin (LOVENOX) injection  40 mg Subcutaneous Q24H  . feeding supplement  237 mL Oral TID BM  . gabapentin  300 mg Oral QHS  . ipratropium-albuterol  3 mL Nebulization BID  . metoprolol tartrate  25 mg Oral Daily  . metroNIDAZOLE  500 mg Oral Q8H  . multivitamin with minerals  1 tablet Oral Daily  . tamsulosin  0.4 mg Oral Daily   Continuous Infusions: . vancomycin 750 mg (05/15/20 1321)     LOS: 7 days   Time spent: Greater than 50% of this time was spent in counseling, explanation of diagnosis, planning of further management, and coordination of care.   Voice  Recognition Reubin Milan dictation system was used to create this note, attempts have been made to correct errors. Please contact the author with questions and/or clarifications.   Albertine Grates, MD PhD FACP [facility]  Available via Epic secure chat 7am-7pm for nonurgent issues Please page for urgent issues To page the attending provider between 7A-7P or the covering provider during after hours 7P-7A, please log into the web site [email or web address] and access using universal [place] password for that web site. If you do not have the password, please call the hospital operator.    05/15/2020, 3:49 PM

## 2020-05-15 NOTE — TOC Progression Note (Addendum)
Transition of Care [facility]) - Progression Note    Patient Details  Name: Dylan Harrington MRN: [id number] Date of Birth: 09-24-1940  [facility] CM/SW Contact  Erin Sons, [place] Phone Number: 05/15/2020, 8:42 AM  Clinical Narrative:     CSW received call from [facility]; they stated they will have no beds available until late next week.   Called [facility]; went straight to voicemail. CSW left message requesting return call. CSW called [facility] again at 1122am and is informed that they will not have beds available until "the week after next"   Called [facility]; notified that they have no bed available until sometime next week  CSW called [facility] and [employer]; received admissions # [phone]; Left message requesting call back.   CSW called [facility] [place]; Faxed referral to [phone]; left message requesting call back from admissions department.  [facility]; received admissions coordinator [phone] for [employer]. CSW left message requesting call back. CSW faxed referral to [phone]. Received call back from [facility]missions at 1105am. CSW is informed they will review referral but likely no beds until sometime next week.    Expected Discharge Plan: Home w Home Health Services Barriers to Discharge: Continued Medical Work up  Expected Discharge Plan and Services Expected Discharge Plan: Home w Home Health Services   Discharge Planning Services: CM Consult Post Acute Care Choice: Skilled Nursing Facility Living arrangements for the past 2 months: Single Family Home                 DME Arranged: Wheelchair manual DME Agency: [employer] Date DME Agency Contacted: 05/14/20 Time DME Agency Contacted: [phone] Representative spoke with at DME Agency: [employer] HH Arranged: PT,OT,RN,Nurse's Aide,Social Work [employer] Agency: [facility] Date [facility] Agency Contacted:  05/13/20 Time HH Agency Contacted: 1450 Representative spoke with at [facility] Agency: Kandee Keen   Social Determinants of Health (SDOH) Interventions    Readmission Risk Interventions No flowsheet data found.

## 2020-05-15 NOTE — Progress Notes (Signed)
Physical Therapy Treatment Patient Details Name: Dylan Harrington MRN: [id number] DOB: 28-Feb-1940 Today's Date: 05/15/2020    History of Present Illness The pt is a 80 yo male presentingto ED on 3/24 with x1 week of R foot wound with drainage. Pt found to have infection , and is now s/p I &D of R foot abcess with placement of wound vac on 3/26. PMH includes: COPD, glaucoma, HTN, OSA, spine, knee, and shoulder surgery.    PT Comments    Pt supine in bed on arrival this session.  Focused on LE exercises this session as he remains to present with poor strength during functional mobility.  Pt continues to benefit from short term rehab in snf to improve strength and function during recovery.  If he chooses to d/c home he will need HHPT and DME listed below.  Pt continues to require heavy mod assistance.      Follow Up Recommendations  SNF;Supervision for mobility/OOB (if he does go home will require HHPT.)     Equipment Recommendations  Rolling walker with 5" wheels;Wheelchair cushion (measurements PT);Wheelchair (measurements PT);3in1 (PT)    Recommendations for Other Services       Precautions / Restrictions Precautions Precautions: Fall Precaution Comments: wound vac Required Braces or Orthoses: Other Brace Other Brace: post-op shoe Restrictions Weight Bearing Restrictions: Yes RLE Weight Bearing: Non weight bearing Other Position/Activity Restrictions: Pt continues to have difficulty maintaining NWB.    Mobility  Bed Mobility Overal bed mobility: Needs Assistance Bed Mobility: Rolling;Sidelying to Sit Rolling: Supervision Sidelying to sit: Supervision;HOB elevated       General bed mobility comments: supervision for line management and to avoid use of RLE, pt able to complete without assist, used bed rail and elevated HOB    Transfers Overall transfer level: Needs assistance Equipment used: Rolling walker (2 wheeled) Transfers: Sit to/from Stand Sit to Stand: Min  assist         General transfer comment: Cues for hand placement and weight bearing.  Increased time to rise into standing this session.  Pt touching R foot down for balance with max VCs to keep foot off the floor.  Ambulation/Gait Ambulation/Gait assistance: Mod assist (heavy mod assistance) Gait Distance (Feet): 8 Feet Assistive device: Rolling walker (2 wheeled) Gait Pattern/deviations: Step-to pattern;Trunk flexed     General Gait Details: Focused on hop steps and able to maintain weight bearing for 60% on the time this session but had difficulty maintaining with turns and backing.  R shoulder is very weak too which limits ability to use BUEs for gt training.   Stairs             Wheelchair Mobility    Modified Rankin (Stroke Patients Only)       Balance Overall balance assessment: Needs assistance Sitting-balance support: Feet supported;No upper extremity supported Sitting balance-Leahy Scale: Good       Standing balance-Leahy Scale: Poor Standing balance comment: modA and BUE support with constant cues to maintain RLE NWB                            Cognition Arousal/Alertness: Awake/alert Behavior During Therapy: WFL for tasks assessed/performed;Impulsive Overall Cognitive Status: Impaired/Different from baseline Area of Impairment: Following commands;Safety/judgement;Problem solving                     Memory: Decreased recall of precautions;Decreased short-term memory Following Commands: Follows one step commands with increased time;Follows one  step commands inconsistently Safety/Judgement: Decreased awareness of safety;Decreased awareness of deficits   Problem Solving: Slow processing;Difficulty sequencing;Requires verbal cues;Requires tactile cues General Comments: Required re-education on precautions and frequent cueing for safety.  pt having a difficult time maintaining weight bearing this session.      Exercises General  Exercises - Lower Extremity Ankle Circles/Pumps: AROM;Both;Supine;20 reps Quad Sets: AROM;Both;10 reps;Supine Long Arc Quad: AROM;Both;10 reps;Supine Heel Slides: AROM;Both;10 reps;Supine;AAROM Hip ABduction/ADduction: AROM;Both;10 reps;Supine Straight Leg Raises: AROM;Both;10 reps;Supine  HEP issued this session:  Access Code for medbridge:  [id number]  General Comments        Pertinent Vitals/Pain Pain Assessment: Faces Faces Pain Scale: Hurts even more (w/ movement.) Pain Location: R knee and R shoulder this session, no c/o R foot pain. Pain Descriptors / Indicators: Discomfort;Grimacing Pain Intervention(s): Monitored during session;Repositioned    Home Living                      Prior Function            PT Goals (current goals can now be found in the care plan section) Acute Rehab PT Goals Patient Stated Goal: return home to wife Potential to Achieve Goals: Fair Progress towards PT goals: Progressing toward goals    Frequency    Min 4X/week (will keep 4x week until snf is solidified.)      PT Plan Current plan remains appropriate    Co-evaluation              AM-PAC PT "6 Clicks" Mobility   Outcome Measure  Help needed turning from your back to your side while in a flat bed without using bedrails?: None Help needed moving from lying on your back to sitting on the side of a flat bed without using bedrails?: A Little Help needed moving to and from a bed to a chair (including a wheelchair)?: A Little Help needed standing up from a chair using your arms (e.g., wheelchair or bedside chair)?: A Little Help needed to walk in hospital room?: A Lot Help needed climbing 3-5 steps with a railing? : A Lot 6 Click Score: 17    End of Session Equipment Utilized During Treatment: Gait belt Activity Tolerance: Patient tolerated treatment well Patient left: with call bell/phone within reach;in chair;with chair alarm set Nurse Communication: Mobility  status PT Visit Diagnosis: Unsteadiness on feet (R26.81);Other abnormalities of gait and mobility (R26.89);Muscle weakness (generalized) (M62.81)     Time: 8127-5170 PT Time Calculation (min) (ACUTE ONLY): 42 min  Charges:  $Gait Training: 8-22 mins $Therapeutic Exercise: 8-22 mins $Therapeutic Activity: 8-22 mins                     Bonney Leitz , PTA Acute Rehabilitation Services Pager [phone] Office [phone]     Doyal Saric Artis Delay 05/15/2020, 1:01 PM

## 2020-05-15 NOTE — TOC Progression Note (Addendum)
Transition of Care [facility]) - Progression Note    Patient Details  Name: Dylan Harrington MRN: [id number] Date of Birth: 1940-03-23  Transition of Care [facility]) CM/SW Contact  Erin Sons, [place] Phone Number: 05/15/2020, 12:48 PM  Clinical Narrative:     Pt's Granddaughter Krystal Eaton called CSW requesting additional facilities in [place] area. With assistance from CSW Cherish, the following were contacted:  [facility] SNF [phone]; They stated they would review pt. Referral faxed to [facility] at [phone]  [facility] [phone]; voicemail left requesting return call  [facility] [phone]; voicemail left requesting return call  [facility] [phone]; voicemail left requesting return call  [facility] [phone]; Spoke with Gerri Spore who said she will review. Has Male semi-private beds available but can't admit until Monday if they can offer on pt. CSW faxed referral to [phone]  1354: Received call from [employer] about auth requesting a facility be added to auth request before it can be processed.   1628: There have been no further responses from [facility] SNF's so far. TOC to continue to follow up with SNF facilities  Expected Discharge Plan: Home w Home Health Services Barriers to Discharge: Continued Medical Work up  Expected Discharge Plan and Services Expected Discharge Plan: Home w Home Health Services   Discharge Planning Services: CM Consult Post Acute Care Choice: Skilled Nursing Facility Living arrangements for the past 2 months: Single Family Home                 DME Arranged: Wheelchair manual DME Agency: [employer] Date DME Agency Contacted: 05/14/20 Time DME Agency Contacted: [phone] Representative spoke with at DME Agency: [employer] HH Arranged: PT,OT,RN,Nurse's Aide,Social Work [employer] Agency: [facility] Date [facility] Agency Contacted: 05/13/20 Time HH Agency Contacted: [phone] Representative spoke with at [facility] Agency:  Kandee Keen   Social Determinants of Health (SDOH) Interventions    Readmission Risk Interventions No flowsheet data found.

## 2020-05-16 DIAGNOSIS — L03115 Cellulitis of right lower limb: Secondary | ICD-10-CM | POA: Diagnosis not present

## 2020-05-16 NOTE — TOC Progression Note (Addendum)
Transition of Care [facility]) - Progression Note    Patient Details  Name: Dylan Harrington MRN: [id number] Date of Birth: 06-04-1940  Transition of Care [facility]) CM/SW Contact  Carmina Miller, LCSWA Phone Number: 05/16/2020, 11:09 AM  Clinical Narrative:    CSW spoke with pt's granddaughter in reference to dc options. Updated her on the notes from previous CSW as far as the status of the [employer]. Also spoke about working with SW at local SNFs to transfer pt to [place] after short term rehab for LTC if pt was not accepted in [place]. Pt's granddaughter states she is worried about what will happen if pt dc back home as family dynamics are complex, CSW went over some options. Granddaughter states she feels a little better and if no SNFs take pt in [place], she would choose one in [place] and work with them on transitioning to [employer]. CSW advised granddaughter to follow up with CSW on Monday to discuss further.    Expected Discharge Plan: Home w Home Health Services Barriers to Discharge: Continued Medical Work up  Expected Discharge Plan and Services Expected Discharge Plan: Home w Home Health Services   Discharge Planning Services: CM Consult Post Acute Care Choice: Skilled Nursing Facility Living arrangements for the past 2 months: Single Family Home                 DME Arranged: Wheelchair manual DME Agency: [employer] Date DME Agency Contacted: 05/14/20 Time DME Agency Contacted: [phone] Representative spoke with at DME Agency: [employer] HH Arranged: PT,OT,RN,Nurse's Aide,Social Work [employer] Agency: [facility] Date [facility] Agency Contacted: 05/13/20 Time HH Agency Contacted: 1450 Representative spoke with at [facility] Agency: Kandee Keen   Social Determinants of Health (SDOH) Interventions    Readmission Risk Interventions No flowsheet data found.

## 2020-05-16 NOTE — Progress Notes (Addendum)
PROGRESS NOTE    Dylan Harrington  HYQ:[id number] DOB: 1940-09-10 DOA: 05/07/2020 PCP: Winifred OliveKirkman, Paul, MD    Chief Complaint  Patient presents with  . Foot Problem  . Wound Infection    Brief Narrative:   80yo w/ a hx of COPD, OSA, HTN, BPH, tobacco abuse, and neuropathy who presented to the ER with worsening right foot pain after having stepped on sharp object 10 days prior resulting in a wound to the bottom of his foot.  He had progressive erythema and purulent drainage despite use of Bactrim in the outpatient setting per his PCP.  Following admission the patient was treated with IV vancomycin and meropenem.  He underwent excisional debridement and wound VAC placement per Dr. Lajoyce Cornersuda 3/26.  Superficial wound cultures revealed MRSA.  ID directing antibiotic therapy.  Subjective:  tmax 98.8, Right knee pain appears resolved  Appear slow to answer questions, not oriented to the year, states it is 2021, slightly confused about the month but able to self correct month, oriented to  place No wheezing , does not appear to have dyspnea while laying in bed   He remains very weak and  relies on heavy assistance for ADLs per PT   Wound vac to right foot  External urinary catheter with concentrated urine  He again declined  cpap last night  Assessment & Plan:   Principal Problem:   Cellulitis of right foot Active Problems:   COPD (chronic obstructive pulmonary disease) (HCC)   Essential hypertension   Skin ulcer of plantar aspect of foot (HCC)   Nicotine dependence, cigarettes, uncomplicated   BPH without urinary obstruction   Lactic acidosis   Hypokalemia   Cutaneous abscess of right foot   Protein-calorie malnutrition, severe   Foot abscess, right   Right foot cellulitis/abscess, sepsis present on admission -Received Tdap vaccine during this hospitalization (stepped on sharp object 10 days prior resulting in a wound to the bottom of his foot.) -Status post I&D March  26 -Currently on vancomycin and Flagyl, Plan to discharge on Zyvox, has appointment with ID on May 26, 2020 at 3:45 PM -Per Dr. Lajoyce Cornersuda patient can discharge with prevena plus portable pump, non weight bearing right foot, f/u with Dr. Lajoyce Cornersuda in 1 week   Possible prediabetes A1c was checked on admission, A1c is 6.1% Lenient control in a 80 year old patient  Right knee pain Concerning for joint fluids Knee x ray + effusion Evaluated by ortho, recommend outpatient follow up, details please see ortho note from 4/1 am  AKI on CKD 2 -Resolved,  -Home medication lisinopril HCTZ held since admission  Hypertension Blood pressure stable on metoprolol, continue hold lisinopril HCTZ  COPD/tobacco use -Mild wheezing heard on exam today, will get chest x-ray, add on DuoNeb -Encourage smoking cessation  BPH Continue Flomax  Peripheral neuropathy Continue Neurontin  OSA Encourage CPAP compliance He declined CPAP last night  Nutritional Assessment: The patient's BMI is: Body mass index is 28.2 kg/m.Marland Kitchen.Seen by dietician.  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     Unresulted Labs (From admission, onward)         None        DVT prophylaxis: SCDs Start: 05/09/20 1054 enoxaparin (LOVENOX)  injection 40 mg Start: 05/08/20 1000   Code Status: Full Family Communication: daughter at bedside on 3/31, over the phone on 4/1 and 4/2 Disposition:   Status is: Inpatient   Dispo: The patient is from: home              Anticipated d/c is to: SNF versus home health               Anticipated d/c date is: to be determined , medically stable to discharge                Consultants:   Orthopedics  ID  Procedures:  Status post I&D March 26 right foot  Antimicrobials:    Anti-infectives (From admission, onward)   Start     Dose/Rate Route Frequency Ordered Stop   05/11/20 1400  metroNIDAZOLE (FLAGYL) tablet 500 mg        500 mg Oral Every 8 hours 05/11/20 1129     05/08/20 1000  vancomycin (VANCOREADY) IVPB 750 mg/150 mL        750 mg 150 mL/hr over 60 Minutes Intravenous Every 12 hours 05/08/20 0213     05/08/20 0215  meropenem (MERREM) 1 g in sodium chloride 0.9 % 100 mL IVPB  Status:  Discontinued        1 g 200 mL/hr over 30 Minutes Intravenous Every 8 hours 05/08/20 0211 05/11/20 1129   05/08/20 0215  vancomycin (VANCOREADY) IVPB 1500 mg/300 mL        1,500 mg 150 mL/hr over 120 Minutes Intravenous  Once 05/08/20 0211 05/08/20 0507         Objective: Vitals:   05/16/20 0424 05/16/20 0831 05/16/20 0948 05/16/20 1455  BP: (!) 149/76 134/71  128/75  Pulse: 79 86 66 69  Resp: Temp: 98.8 F (37.1 C) 98.2 F (36.8 C)  (!) 97.5 F (36.4 C)  TempSrc:  Oral  Oral  SpO2: 96% 95% 99% 99%  Weight:      Height:        Intake/Output Summary (Last 24 hours) at 05/16/2020 1550 Last data filed at 05/16/2020 1235 Gross per 24 hour  Intake 480 ml  Output 1701 ml  Net -1221 ml   Filed Weights   05/07/20 2308 05/08/20 1355  Weight: 94.3 kg 94.3 kg    Examination:  General exam: frail elderly, chronically ill appearing, weak but NAD Respiratory system: no wheezing today,  Respiratory effort normal. Cardiovascular system: S1 & S2 heard, RRR. No JVD, no murmur, No pedal edema. Gastrointestinal system: Abdomen is nondistended, soft and nontender.  Normal bowel sounds heard. Central nervous system: Alert and oriented. No focal neurological deficits. Extremities: generalized weakness, right foot post op changes, dressing and wound vac in  place, right knee tenderness has resolved, less swollen  Skin: No rashes, lesions or ulcers Psychiatry: appear slightly confused, not oriented to the year, but calm and cooperative     Data Reviewed: I have personally reviewed following labs and imaging studies  CBC: Recent Labs  Lab 05/10/20 0136 05/11/20 0831 05/14/20 0258 05/15/20 0334  WBC 8.7 6.5 8.5 7.0  NEUTROABS  --   --  5.8 4.5  HGB 11.3* 11.6* 11.0* 10.4*  HCT 35.0* 35.7* 33.9* 32.1*  MCV 99.2 98.9 97.7 99.7  PLT 269 251 264 243    Basic Metabolic Panel: Recent Labs  Lab 05/10/20 0136 05/11/20 0831 05/14/20 0258 05/15/20 0334  NA 133* 135 136 137  K 4.4  3.9 3.6 3.6  CL 99 102 106 106  CO2 GLUCOSE 112* 169* 147* 100*  BUN CREATININE 1.17 0.92 1.01 0.92  CALCIUM 7.9* 8.0* 7.9* 7.6*  MG 1.8 2.0 1.9 2.0  PHOS 3.1 2.0*  --   --     GFR: Estimated Creatinine Clearance: 77.6 mL/min (by C-G formula based on SCr of 0.92 mg/dL).  Liver Function Tests: Recent Labs  Lab 05/10/20 0136 05/11/20 0831 05/14/20 0258  AST  --   --  20  ALT  --   --  12  ALKPHOS  --   --  38  BILITOT  --   --  1.0  PROT  --   --  6.9  ALBUMIN 2.1* 2.0* 1.8*    CBG: No results for input(s): GLUCAP in the last 168 hours.   Recent Results (from the past 240 hour(s))  Culture, blood (Routine x 2)     Status: None   Collection Time: 05/07/20 11:00 PM   Specimen: BLOOD LEFT ARM  Result Value Ref Range Status   Specimen Description BLOOD LEFT ARM  Final   Special Requests   Final    BOTTLES DRAWN AEROBIC AND ANAEROBIC Blood Culture adequate volume   Culture   Final    NO GROWTH 5 DAYS Performed at [facility], [address] [place], [place] [address]    Report Status 05/13/2020 FINAL  Final  Culture, blood (Routine x 2)     Status: None   Collection Time: 05/07/20 11:27 PM   Specimen: BLOOD RIGHT ARM  Result Value Ref Range Status   Specimen Description BLOOD RIGHT ARM  Final    Special Requests AEROBIC BOTTLE ONLY Blood Culture adequate volume  Final   Culture   Final    NO GROWTH 5 DAYS Performed at [facility], [address], [place], [place] [address]    Report Status 05/13/2020 FINAL  Final  Resp Panel by RT-PCR (Flu A&B, Covid) Nasopharyngeal Swab     Status: None   Collection Time: 05/08/20  1:38 AM   Specimen: Nasopharyngeal Swab; Nasopharyngeal(NP) swabs in vial transport medium  Result Value Ref Range Status   SARS Coronavirus 2 by RT PCR NEGATIVE NEGATIVE Final    Comment: (NOTE) SARS-CoV-2 target nucleic acids are NOT DETECTED.  The SARS-CoV-2 RNA is generally detectable in upper respiratory specimens during the acute phase of infection. The lowest concentration of SARS-CoV-2 viral copies this assay can detect is 138 copies/mL. A negative result does not preclude SARS-Cov-2 infection and should not be used as the sole basis for treatment or other patient management decisions. A negative result may occur with  improper specimen collection/handling, submission of specimen other than nasopharyngeal swab, presence of viral mutation(s) within the areas targeted by this assay, and inadequate number of viral copies(<138 copies/mL). A negative result must be combined with clinical observations, patient history, and epidemiological information. The expected result is Negative.  Fact Sheet for Patients:  BloggerCourse.com  Fact Sheet for Healthcare Providers:  SeriousBroker.it  This test is no t yet approved or cleared by the [place] FDA and  has been authorized for detection and/or diagnosis of SARS-CoV-2 by FDA under an Emergency Use Authorization (EUA). This EUA will remain  in effect (meaning this test can be used) for the duration of the COVID-19 declaration under Section 564(b)(1) of the Act, 21 U.S.C.section 360bbb-3(b)(1), unless the authorization is terminated  or revoked sooner.        Influenza A by PCR NEGATIVE NEGATIVE Final   Influenza B by PCR NEGATIVE NEGATIVE Final    Comment: (NOTE) The Xpert Xpress SARS-CoV-2/FLU/RSV plus assay is intended as an aid in the diagnosis of influenza from Nasopharyngeal swab specimens and should not be used as a sole basis for treatment. Nasal washings and aspirates are unacceptable for Xpert Xpress SARS-CoV-2/FLU/RSV testing.  Fact Sheet for Patients: BloggerCourse.com  Fact Sheet for Healthcare Providers: SeriousBroker.it  This test is not yet approved or cleared by the [place] FDA and has been authorized for detection and/or diagnosis of SARS-CoV-2 by FDA under an Emergency Use Authorization (EUA). This EUA will remain in effect (meaning this test can be used) for the duration of the COVID-19 declaration under Section 564(b)(1) of the Act, 21 U.S.C. section 360bbb-3(b)(1), unless the authorization is terminated or revoked.  Performed at [facility], [address] [place], [place] [address]   Aerobic Culture w Gram Stain (superficial specimen)     Status: None   Collection Time: 05/08/20  4:54 AM   Specimen: Wound  Result Value Ref Range Status   Specimen Description WOUND  Final   Special Requests FOOT RIGHT  Final   Gram Stain   Final    FEW WBC PRESENT,BOTH PMN AND MONONUCLEAR FEW GRAM POSITIVE COCCI Performed at [facility], [address], [place], [place] [address]    Culture   Final    ABUNDANT METHICILLIN RESISTANT STAPHYLOCOCCUS AUREUS   Report Status 05/10/2020 FINAL  Final   Organism ID, Bacteria METHICILLIN RESISTANT STAPHYLOCOCCUS AUREUS  Final      Susceptibility   Methicillin resistant staphylococcus aureus - MIC*    CIPROFLOXACIN >=8 RESISTANT Resistant     ERYTHROMYCIN >=8 RESISTANT Resistant     GENTAMICIN <=0.5 SENSITIVE Sensitive     OXACILLIN >=4 RESISTANT Resistant     TETRACYCLINE >=16 RESISTANT Resistant      VANCOMYCIN <=0.5 SENSITIVE Sensitive     TRIMETH/SULFA >=320 RESISTANT Resistant     CLINDAMYCIN <=0.25 SENSITIVE Sensitive     RIFAMPIN <=0.5 SENSITIVE Sensitive     Inducible Clindamycin NEGATIVE Sensitive     * ABUNDANT METHICILLIN RESISTANT STAPHYLOCOCCUS AUREUS  MRSA PCR Screening     Status: Abnormal   Collection Time: 05/09/20  7:41 AM   Specimen: Nasal Mucosa; Nasopharyngeal  Result Value Ref Range Status   MRSA by PCR POSITIVE (A) NEGATIVE Final    Comment:        The GeneXpert MRSA Assay (FDA approved for NASAL specimens only), is one component of a comprehensive MRSA colonization surveillance program. It is not intended to diagnose MRSA infection nor to guide or monitor treatment for MRSA infections. RESULT CALLED TO, READ BACK BY AND VERIFIED WITH: RN MARISSA [phone] MLM Performed at [facility], [address], [place], [place] [address]   Aerobic/Anaerobic Culture w Gram Stain (surgical/deep wound)     Status: None   Collection Time: 05/09/20  9:18 AM   Specimen: PATH Soft tissue  Result Value Ref Range Status   Specimen Description TISSUE  Final   Special Requests FOOT RIGHT  Final   Gram Stain NO WBC SEEN NO ORGANISMS SEEN   Final   Culture   Final    MODERATE METHICILLIN RESISTANT STAPHYLOCOCCUS AUREUS CRITICAL RESULT CALLED TO, READ BACK BY AND VERIFIED WITH: RN DOROTHY M. [id number] FCP NO ANAEROBES ISOLATED Performed at [facility], [address] [place], [place] [address]    Report Status 05/14/2020 FINAL  Final   Organism ID, Bacteria METHICILLIN RESISTANT STAPHYLOCOCCUS AUREUS  Final      Susceptibility   Methicillin resistant staphylococcus aureus - MIC*    CIPROFLOXACIN >=8 RESISTANT Resistant     ERYTHROMYCIN >=8 RESISTANT Resistant     GENTAMICIN <=0.5 SENSITIVE Sensitive     OXACILLIN >=4 RESISTANT Resistant     TETRACYCLINE >=16 RESISTANT Resistant     VANCOMYCIN <=0.5 SENSITIVE Sensitive     TRIMETH/SULFA >=320  RESISTANT Resistant     CLINDAMYCIN <=0.25 SENSITIVE Sensitive     RIFAMPIN <=0.5 SENSITIVE Sensitive     Inducible Clindamycin NEGATIVE Sensitive     * MODERATE METHICILLIN RESISTANT STAPHYLOCOCCUS AUREUS  SARS CORONAVIRUS 2 (TAT 6-24 HRS) Nasopharyngeal Nasopharyngeal Swab     Status: None   Collection Time: 05/14/20 10:12 PM   Specimen: Nasopharyngeal Swab  Result Value Ref Range Status   SARS Coronavirus 2 NEGATIVE NEGATIVE Final    Comment: (NOTE) SARS-CoV-2 target nucleic acids are NOT DETECTED.  The SARS-CoV-2 RNA is generally detectable in upper and lower respiratory specimens during the acute phase of infection. Negative results do not preclude SARS-CoV-2 infection, do not rule out co-infections with other pathogens, and should not be used as the sole basis for treatment or other patient management decisions. Negative results must be combined with clinical observations, patient history, and epidemiological information. The expected result is Negative.  Fact Sheet for Patients: HairSlick.no  Fact Sheet for Healthcare Providers: quierodirigir.com  This test is not yet approved or cleared by the [place] FDA and  has been authorized for detection and/or diagnosis of SARS-CoV-2 by FDA under an Emergency Use Authorization (EUA). This EUA will remain  in effect (meaning this test can be used) for the duration of the COVID-19 declaration under Se ction 564(b)(1) of the Act, 21 U.S.C. section 360bbb-3(b)(1), unless the authorization is terminated or revoked sooner.  Performed at [facility], [address] [place], [place] [address]          Radiology Studies: No results found.      Scheduled Meds: . docusate sodium  100 mg Oral BID  . enoxaparin (LOVENOX) injection  40 mg Subcutaneous Q24H  . feeding supplement  237 mL Oral TID BM  . gabapentin  300 mg Oral QHS  . ipratropium-albuterol  3 mL  Nebulization BID  . metoprolol tartrate  25 mg Oral Daily  . metroNIDAZOLE  500 mg Oral Q8H  . multivitamin with minerals  1 tablet Oral Daily  . tamsulosin  0.4 mg Oral Daily   Continuous Infusions: . vancomycin 750 mg (05/16/20 1438)     LOS: 8 days   Time spent: Greater than 50% of this time was spent in counseling, explanation of diagnosis, planning of further management, and coordination of care.   Voice Recognition Reubin [place] dictation system was used to create this note, attempts have been made to correct errors. Please contact the author with questions and/or clarifications.   Albertine Grates, MD PhD FACP [facility]  Available via Epic secure chat 7am-7pm for nonurgent issues Please page for urgent issues To page the attending provider between 7A-7P or the covering provider during after hours 7P-7A, please log into the web site [email or web address] and access using universal [employer] password for that web site. If you do not have the password, please call the hospital operator.    05/16/2020,  3:50 PM

## 2020-05-16 NOTE — Plan of Care (Signed)

## 2020-05-16 NOTE — Progress Notes (Signed)
Patients granddaughter called at [phone] asking where patient is going after d/c. Phone number given to [facility] staff Deveron Furlong, RN and asked to please call and give updates ASAP.

## 2020-05-17 DIAGNOSIS — L03115 Cellulitis of right lower limb: Secondary | ICD-10-CM | POA: Diagnosis not present

## 2020-05-17 NOTE — Plan of Care (Signed)

## 2020-05-17 NOTE — Progress Notes (Addendum)
PROGRESS NOTE    Dylan Harrington  CLE:[id number] DOB: 1940-09-11 DOA: 05/07/2020 PCP: Winifred Olive, MD    Chief Complaint  Patient presents with  . Foot Problem  . Wound Infection    Brief Narrative:   80yo w/ a hx of COPD, OSA, HTN, BPH, tobacco abuse, and neuropathy who presented to the ER with worsening right foot pain after having stepped on sharp object 10 days prior resulting in a wound to the bottom of his foot.  He had progressive erythema and purulent drainage despite use of Bactrim in the outpatient setting per his PCP.  Following admission the patient was treated with IV vancomycin and meropenem.  He underwent excisional debridement and wound VAC placement per Dr. Lajoyce Corners 3/26.  Superficial wound cultures revealed MRSA.  ID directing antibiotic therapy.  Subjective:  tmax 98.6, Denies pain, he is wondering when the wound vac can come off  Appear slow to answer questions, not oriented to the year, states it is 2021, oriented to the month and place  No wheezing , does not appear to have dyspnea while laying in bed   He remains very weak and  relies on heavy assistance for ADLs per PT   Wound vac to right foot   Assessment & Plan:   Principal Problem:   Cellulitis of right foot Active Problems:   COPD (chronic obstructive pulmonary disease) (HCC)   Essential hypertension   Skin ulcer of plantar aspect of foot (HCC)   Nicotine dependence, cigarettes, uncomplicated   BPH without urinary obstruction   Lactic acidosis   Hypokalemia   Cutaneous abscess of right foot   Protein-calorie malnutrition, severe   Foot abscess, right   Right foot cellulitis/abscess, sepsis present on admission -Received Tdap vaccine during this hospitalization (stepped on sharp object 10 days prior resulting in a wound to the bottom of his foot.) -Status post I&D March 26 -Currently on vancomycin and Flagyl, Plan to discharge on Zyvox ( Can switch to linezolid 600 mg p.o. twice daily  for 2 weeks from 3/29), has appointment with ID on May 26, 2020 at 3:45 PM -Per Dr. Lajoyce Corners patient can discharge with prevena plus portable pump, non weight bearing right foot, f/u with Dr. Lajoyce Corners in 1 week   Possible prediabetes A1c was checked on admission, A1c is 6.1% Lenient control in a 80 year old patient  Right knee pain Concerning for joint fluids Knee x ray + effusion Evaluated by ortho, recommend outpatient follow up, details please see ortho note from 4/1 am  AKI on CKD 2 -Resolved,  -Home medication lisinopril HCTZ held since admission  Hypertension Blood pressure stable on metoprolol, continue hold lisinopril HCTZ  COPD/tobacco use -Mild wheezing heard on exam today, will get chest x-ray, add on DuoNeb -Encourage smoking cessation  BPH Continue Flomax  Peripheral neuropathy Continue Neurontin  OSA Encourage CPAP compliance He declined CPAP at night in the hospital  Nutritional Assessment: The patient's BMI is: Body mass index is 28.2 kg/m.Marland KitchenSeen by dietician.  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     Unresulted Labs (From admission, onward)         None        DVT prophylaxis: SCDs Start: 05/09/20  1054 enoxaparin (LOVENOX) injection 40 mg Start: 05/08/20 1000   Code Status: Full Family Communication: daughter at bedside on 3/31, over the phone on 4/1 and 4/2, granddaughter over the phone on 4/3 Disposition:   Status is: Inpatient   Dispo: The patient is from: home              Anticipated d/c is to: SNF versus home health               Anticipated d/c date is: to be determined , medically stable to discharge                Consultants:   Orthopedics  ID  Procedures:  Status post I&D March 26 right foot  Antimicrobials:    Anti-infectives (From admission, onward)   Start     Dose/Rate Route Frequency Ordered Stop   05/11/20 1400  metroNIDAZOLE (FLAGYL) tablet 500 mg        500 mg Oral Every 8 hours 05/11/20 1129     05/08/20 1000  vancomycin (VANCOREADY) IVPB 750 mg/150 mL        750 mg 150 mL/hr over 60 Minutes Intravenous Every 12 hours 05/08/20 0213     05/08/20 0215  meropenem (MERREM) 1 g in sodium chloride 0.9 % 100 mL IVPB  Status:  Discontinued        1 g 200 mL/hr over 30 Minutes Intravenous Every 8 hours 05/08/20 0211 05/11/20 1129   05/08/20 0215  vancomycin (VANCOREADY) IVPB 1500 mg/300 mL        1,500 mg 150 mL/hr over 120 Minutes Intravenous  Once 05/08/20 0211 05/08/20 0507         Objective: Vitals:   05/16/20 1945 05/16/20 2120 05/17/20 0326 05/17/20 0829  BP: 136/75  136/76 133/72  Pulse: 61 66 72 77  Resp: 16 17 15 16   Temp: 98 F (36.7 C)  98.6 F (37 C) 98 F (36.7 C)  TempSrc:    Oral  SpO2: 94% 95% 95% 93%  Weight:      Height:        Intake/Output Summary (Last 24 hours) at 05/17/2020 1056 Last data filed at 05/17/2020 07/17/2020 Gross per 24 hour  Intake 240 ml  Output 602 ml  Net -362 ml   Filed Weights   05/07/20 2308 05/08/20 1355  Weight: 94.3 kg 94.3 kg    Examination:  General exam: frail elderly, chronically ill appearing, weak but NAD Respiratory system: no wheezing today,  Respiratory effort normal. Cardiovascular system: S1 & S2 heard, RRR. No JVD, no murmur, No pedal edema. Gastrointestinal system: Abdomen is nondistended, soft and nontender.  Normal bowel sounds heard. Central nervous system: Alert and oriented. No focal neurological deficits. Extremities: generalized weakness, right foot post op changes, dressing and wound vac in place, right  knee tenderness has resolved, less swollen  Skin: No rashes, lesions or ulcers Psychiatry: appear slightly confused, not oriented to the year, but calm and cooperative     Data Reviewed: I have personally reviewed following labs and imaging studies  CBC: Recent Labs  Lab 05/11/20 0831 05/14/20 0258 05/15/20 0334  WBC 6.5 8.5 7.0  NEUTROABS  --  5.8 4.5  HGB 11.6* 11.0* 10.4*  HCT 35.7* 33.9* 32.1*  MCV 98.9 97.7 99.7  PLT 251 264 243    Basic Metabolic Panel: Recent Labs  Lab 05/11/20 0831 05/14/20 0258 05/15/20 0334  NA 135 136 137  K 3.9 3.6 3.6  CL 102 106  106  CO2 30 26 24   GLUCOSE 169* 147* 100*  BUN 20 20 19   CREATININE 0.92 1.01 0.92  CALCIUM 8.0* 7.9* 7.6*  MG 2.0 1.9 2.0  PHOS 2.0*  --   --     GFR: Estimated Creatinine Clearance: 77.6 mL/min (by C-G formula based on SCr of 0.92 mg/dL).  Liver Function Tests: Recent Labs  Lab 05/11/20 0831 05/14/20 0258  AST  --  20  ALT  --  12  ALKPHOS  --  38  BILITOT  --  1.0  PROT  --  6.9  ALBUMIN 2.0* 1.8*    CBG: No results for input(s): GLUCAP in the last 168 hours.   Recent Results (from the past 240 hour(s))  Culture, blood (Routine x 2)     Status: None   Collection Time: 05/07/20 11:00 PM   Specimen: BLOOD LEFT ARM  Result Value Ref Range Status   Specimen Description BLOOD LEFT ARM  Final   Special Requests   Final    BOTTLES DRAWN AEROBIC AND ANAEROBIC Blood Culture adequate volume   Culture   Final    NO GROWTH 5 DAYS Performed at [facility], [address] [place], [place] [address]    Report Status 05/13/2020 FINAL  Final  Culture, blood (Routine x 2)     Status: None   Collection Time: 05/07/20 11:27 PM   Specimen: BLOOD RIGHT ARM  Result Value Ref Range Status   Specimen Description BLOOD RIGHT ARM  Final   Special Requests AEROBIC BOTTLE ONLY Blood Culture adequate volume  Final   Culture   Final    NO GROWTH 5 DAYS Performed at [facility], [address] [place], [place] [address]    Report Status 05/13/2020 FINAL  Final  Resp Panel by RT-PCR (Flu A&B, Covid) Nasopharyngeal Swab     Status: None   Collection Time: 05/08/20  1:38 AM   Specimen: Nasopharyngeal Swab; Nasopharyngeal(NP) swabs in vial transport medium  Result Value Ref Range Status   SARS Coronavirus 2 by RT PCR NEGATIVE NEGATIVE Final    Comment: (NOTE) SARS-CoV-2 target nucleic acids are NOT DETECTED.  The SARS-CoV-2 RNA is generally detectable in upper respiratory specimens during the acute phase of infection. The lowest concentration of SARS-CoV-2 viral copies this assay can detect is 138 copies/mL. A negative result does not preclude SARS-Cov-2 infection and should not be used as the sole basis for treatment or other patient management decisions. A negative result may occur with  improper specimen collection/handling, submission of specimen other than nasopharyngeal swab, presence of viral mutation(s) within the areas targeted by this assay, and inadequate number of viral copies(<138 copies/mL). A negative result must be combined with clinical observations, patient history, and epidemiological information. The expected result is Negative.  Fact Sheet for Patients:  BloggerCourse.comhttps://www.fda.gov/media/152166/download  Fact Sheet for Healthcare Providers:  SeriousBroker.ithttps://www.fda.gov/media/152162/download  This test is no t yet approved or cleared by the [place] FDA and  has been authorized for detection and/or diagnosis of SARS-CoV-2 by FDA under an Emergency Use Authorization (EUA). This EUA will remain  in effect (meaning this test can be used) for the duration of the COVID-19 declaration under Section 564(b)(1) of the Act, 21 U.S.C.section 360bbb-3(b)(1), unless the authorization is terminated  or revoked sooner.       Influenza A by PCR NEGATIVE NEGATIVE Final   Influenza B by PCR NEGATIVE NEGATIVE Final    Comment: (NOTE) The Xpert Xpress  SARS-CoV-2/FLU/RSV plus  assay is intended as an aid in the diagnosis of influenza from Nasopharyngeal swab specimens and should not be used as a sole basis for treatment. Nasal washings and aspirates are unacceptable for Xpert Xpress SARS-CoV-2/FLU/RSV testing.  Fact Sheet for Patients: BloggerCourse.com  Fact Sheet for Healthcare Providers: SeriousBroker.it  This test is not yet approved or cleared by the [place] FDA and has been authorized for detection and/or diagnosis of SARS-CoV-2 by FDA under an Emergency Use Authorization (EUA). This EUA will remain in effect (meaning this test can be used) for the duration of the COVID-19 declaration under Section 564(b)(1) of the Act, 21 U.S.C. section 360bbb-3(b)(1), unless the authorization is terminated or revoked.  Performed at [facility], [address], [place], [place] [address]   Aerobic Culture w Gram Stain (superficial specimen)     Status: None   Collection Time: 05/08/20  4:54 AM   Specimen: Wound  Result Value Ref Range Status   Specimen Description WOUND  Final   Special Requests FOOT RIGHT  Final   Gram Stain   Final    FEW WBC PRESENT,BOTH PMN AND MONONUCLEAR FEW GRAM POSITIVE COCCI Performed at [facility], [address] [place], [place] [address]    Culture   Final    ABUNDANT METHICILLIN RESISTANT STAPHYLOCOCCUS AUREUS   Report Status 05/10/2020 FINAL  Final   Organism ID, Bacteria METHICILLIN RESISTANT STAPHYLOCOCCUS AUREUS  Final      Susceptibility   Methicillin resistant staphylococcus aureus - MIC*    CIPROFLOXACIN >=8 RESISTANT Resistant     ERYTHROMYCIN >=8 RESISTANT Resistant     GENTAMICIN <=0.5 SENSITIVE Sensitive     OXACILLIN >=4 RESISTANT Resistant     TETRACYCLINE >=16 RESISTANT Resistant     VANCOMYCIN <=0.5 SENSITIVE Sensitive     TRIMETH/SULFA >=320 RESISTANT Resistant     CLINDAMYCIN <=0.25 SENSITIVE Sensitive     RIFAMPIN <=0.5 SENSITIVE  Sensitive     Inducible Clindamycin NEGATIVE Sensitive     * ABUNDANT METHICILLIN RESISTANT STAPHYLOCOCCUS AUREUS  MRSA PCR Screening     Status: Abnormal   Collection Time: 05/09/20  7:41 AM   Specimen: Nasal Mucosa; Nasopharyngeal  Result Value Ref Range Status   MRSA by PCR POSITIVE (A) NEGATIVE Final    Comment:        The GeneXpert MRSA Assay (FDA approved for NASAL specimens only), is one component of a comprehensive MRSA colonization surveillance program. It is not intended to diagnose MRSA infection nor to guide or monitor treatment for MRSA infections. RESULT CALLED TO, READ BACK BY AND VERIFIED WITH: RN MARISSA [phone] MLM Performed at [facility], [address], [place], [place] [address]   Aerobic/Anaerobic Culture w Gram Stain (surgical/deep wound)     Status: None   Collection Time: 05/09/20  9:18 AM   Specimen: PATH Soft tissue  Result Value Ref Range Status   Specimen Description TISSUE  Final   Special Requests FOOT RIGHT  Final   Gram Stain NO WBC SEEN NO ORGANISMS SEEN   Final   Culture   Final    MODERATE METHICILLIN RESISTANT STAPHYLOCOCCUS AUREUS CRITICAL RESULT CALLED TO, READ BACK BY AND VERIFIED WITH: RN DOROTHY M. [phone] FCP NO ANAEROBES ISOLATED Performed at [facility], [address], [place], [place] [address]    Report Status 05/14/2020 FINAL  Final   Organism ID, Bacteria METHICILLIN RESISTANT STAPHYLOCOCCUS AUREUS  Final  Susceptibility   Methicillin resistant staphylococcus aureus - MIC*    CIPROFLOXACIN >=8 RESISTANT Resistant     ERYTHROMYCIN >=8 RESISTANT Resistant     GENTAMICIN <=0.5 SENSITIVE Sensitive     OXACILLIN >=4 RESISTANT Resistant     TETRACYCLINE >=16 RESISTANT Resistant     VANCOMYCIN <=0.5 SENSITIVE Sensitive     TRIMETH/SULFA >=320 RESISTANT Resistant     CLINDAMYCIN <=0.25 SENSITIVE Sensitive     RIFAMPIN <=0.5 SENSITIVE Sensitive     Inducible Clindamycin NEGATIVE Sensitive     *  MODERATE METHICILLIN RESISTANT STAPHYLOCOCCUS AUREUS  SARS CORONAVIRUS 2 (TAT 6-24 HRS) Nasopharyngeal Nasopharyngeal Swab     Status: None   Collection Time: 05/14/20 10:12 PM   Specimen: Nasopharyngeal Swab  Result Value Ref Range Status   SARS Coronavirus 2 NEGATIVE NEGATIVE Final    Comment: (NOTE) SARS-CoV-2 target nucleic acids are NOT DETECTED.  The SARS-CoV-2 RNA is generally detectable in upper and lower respiratory specimens during the acute phase of infection. Negative results do not preclude SARS-CoV-2 infection, do not rule out co-infections with other pathogens, and should not be used as the sole basis for treatment or other patient management decisions. Negative results must be combined with clinical observations, patient history, and epidemiological information. The expected result is Negative.  Fact Sheet for Patients: HairSlick.no  Fact Sheet for Healthcare Providers: quierodirigir.com  This test is not yet approved or cleared by the [place] FDA and  has been authorized for detection and/or diagnosis of SARS-CoV-2 by FDA under an Emergency Use Authorization (EUA). This EUA will remain  in effect (meaning this test can be used) for the duration of the COVID-19 declaration under Se ction 564(b)(1) of the Act, 21 U.S.C. section 360bbb-3(b)(1), unless the authorization is terminated or revoked sooner.  Performed at [facility], [address] [place], [place] [address]          Radiology Studies: No results found.      Scheduled Meds: . docusate sodium  100 mg Oral BID  . enoxaparin (LOVENOX) injection  40 mg Subcutaneous Q24H  . feeding supplement  237 mL Oral TID BM  . gabapentin  300 mg Oral QHS  . ipratropium-albuterol  3 mL Nebulization BID  . metoprolol tartrate  25 mg Oral Daily  . metroNIDAZOLE  500 mg Oral Q8H  . multivitamin with minerals  1 tablet Oral Daily  .  tamsulosin  0.4 mg Oral Daily   Continuous Infusions: . vancomycin 750 mg (05/17/20 0210)     LOS: 9 days   Time spent: Greater than 50% of this time was spent in counseling, explanation of diagnosis, planning of further management, and coordination of care.   Voice Recognition Reubin Milan dictation system was used to create this note, attempts have been made to correct errors. Please contact the author with questions and/or clarifications.   Albertine Grates, MD PhD FACP [facility]  Available via Epic secure chat 7am-7pm for nonurgent issues Please page for urgent issues To page the attending provider between 7A-7P or the covering provider during after hours 7P-7A, please log into the web site [email or web address] and access using universal  password for that web site. If you do not have the password, please call the hospital operator.    05/17/2020, 10:56 AM

## 2020-05-17 NOTE — TOC Progression Note (Signed)
Transition of Care [facility]) - Progression Note    Patient Details  Name: [name] MRN: [id number] Date of Birth: December 14, 1940  Transition of Care [facility]) CM/SW Contact  Carmina Miller, LCSWA Phone Number: 05/17/2020, 10:52 AM  Clinical Narrative:    Received request from [facility] to contact pt's granddaughter Maralyn Sago, CSW spoke with Maralyn Sago, stated she had another SNF that she believed would accept pt based off of personal relationships with people she knew worked there. CSW tried a couple of phone numbers but was unsuccessful in speaking to anyone today (even though the website states open 10-6). Was able to locate a phone number for admissions [phone]), had to leave a vm for "Caryn Bee". Only fax number on site was for the independent living side so CSW couldn't send a referral via fax.    Expected Discharge Plan: Home w Home Health Services Barriers to Discharge: Continued Medical Work up  Expected Discharge Plan and Services Expected Discharge Plan: Home w Home Health Services   Discharge Planning Services: CM Consult Post Acute Care Choice: Skilled Nursing Facility Living arrangements for the past 2 months: Single Family Home                 DME Arranged: Wheelchair manual DME Agency: [employer] Date DME Agency Contacted: 05/14/20 Time DME Agency Contacted: [phone] Representative spoke with at DME Agency: [employer] HH Arranged: PT,OT,RN,Nurse's Aide,Social Work [employer] Agency: [facility] Date [facility] Agency Contacted: 05/13/20 Time HH Agency Contacted: 1450 Representative spoke with at [facility] Agency: Kandee Keen   Social Determinants of Health (SDOH) Interventions    Readmission Risk Interventions No flowsheet data found.

## 2020-05-18 DIAGNOSIS — L03115 Cellulitis of right lower limb: Secondary | ICD-10-CM | POA: Diagnosis not present

## 2020-05-18 LAB — SARS CORONAVIRUS 2 (TAT 6-24 HRS): SARS Coronavirus 2: NEGATIVE

## 2020-05-18 LAB — VANCOMYCIN, TROUGH: Vancomycin Tr: 13 ug/mL — ABNORMAL LOW (ref 15–20)

## 2020-05-18 LAB — CREATININE, SERUM
Creatinine, Ser: 0.87 mg/dL (ref 0.61–1.24)
GFR, Estimated: >60 mL/min (ref >60)

## 2020-05-18 NOTE — Progress Notes (Signed)
Request placed for CMA, please check  linezolid 600mg  bid for 10days. RN,BSN,CM

## 2020-05-18 NOTE — TOC Benefit Eligibility Note (Signed)
Transition of Care [facility]) Benefit Eligibility Note    Patient Details  Name: Dylan Harrington MRN: [id number] Date of Birth: 1940/06/13   Medication/Dose: Linezolid 600 mg. bid for 10 days  Covered?: Yes  Tier:  (Tier 4)  Prescription Coverage Preferred Pharmacy: [place]  Spoke with Person/Company/Phone Number:: Gerarda Gunther W/[employer] Ph# [phone]  Co-Pay: $49.10  Prior Approval: No  Deductible:  (No Deductible)       Renie Ora Phone Number: 05/18/2020, 4:51 PM

## 2020-05-18 NOTE — Progress Notes (Signed)
Physical Therapy Treatment Patient Details Name: Dylan Harrington MRN: [id number] DOB: [birth year] Today's Date: 05/18/2020    History of Present Illness The pt is a 80 yo male presentingto ED on 3/24 with x1 week of R foot wound with drainage. Pt found to have infection , and is now s/p I &D of R foot abcess with placement of wound vac on 3/26. PMH includes: COPD, glaucoma, HTN, OSA, spine, knee, and shoulder surgery.    PT Comments    Pt supine in bed.  He continues to work on strengthening this session.  Pt remains to require min to mod assistance.  He is improving with weight bearing precautions but remains to fatigue quickly and place foot down.  Pt remains to benefit from snf placement to improve strength, function and to decrease caregiver burden.  Plan next session for continued strengthening and progression of gt.    Follow Up Recommendations  SNF;Supervision for mobility/OOB (if he does not go snf will need HHPT)     Equipment Recommendations  Rolling walker with 5" wheels;Wheelchair cushion (measurements PT);Wheelchair (measurements PT);3in1 (PT)    Recommendations for Other Services       Precautions / Restrictions Precautions Precautions: Fall Precaution Comments: wound vac Required Braces or Orthoses: Other Brace Other Brace: post-op shoe Restrictions Weight Bearing Restrictions: Yes RLE Weight Bearing: Non weight bearing Other Position/Activity Restrictions: Pt continues to have difficulty maintaining NWB.    Mobility  Bed Mobility Overal bed mobility: Needs Assistance Bed Mobility: Supine to Sit     Supine to sit: Supervision     General bed mobility comments: Once edge of bed reminders to not push with R LE.    Transfers Overall transfer level: Needs assistance Equipment used: Rolling walker (2 wheeled) Transfers: Sit to/from Stand Sit to Stand: Min assist         General transfer comment: Cues for hand placement and weight bearing.  Increased time  to rise into standing this session.  Pt touching R foot down for balance with max VCs to keep foot off the floor.  Ambulation/Gait Ambulation/Gait assistance: Mod assist Gait Distance (Feet): 12 Feet Assistive device: Rolling walker (2 wheeled) Gait Pattern/deviations: Step-to pattern;Trunk flexed (hop to)     General Gait Details: Focused on hop steps and able to maintain weight bearing for 50% on the time this session but had difficulty maintaining with turns and backing.  R shoulder is very weak too which limits ability to use BUEs for gt training.   Stairs             Wheelchair Mobility    Modified Rankin (Stroke Patients Only)       Balance Overall balance assessment: Needs assistance Sitting-balance support: Feet supported;No upper extremity supported Sitting balance-Leahy Scale: Good       Standing balance-Leahy Scale: Poor                              Cognition Arousal/Alertness: Awake/alert Behavior During Therapy: WFL for tasks assessed/performed;Impulsive Overall Cognitive Status: Impaired/Different from baseline Area of Impairment: Problem solving;Memory;Safety/judgement                     Memory: Decreased recall of precautions   Safety/Judgement: Decreased awareness of safety;Decreased awareness of deficits   Problem Solving: Requires verbal cues;Requires tactile cues;Slow processing General Comments: Pt continues to benefit on education for non weight bearing. Pt continues to have difficulty maintaining during  gt training.      Exercises General Exercises - Lower Extremity Ankle Circles/Pumps: AROM;Both;Supine;20 reps Quad Sets: AROM;Both;10 reps;Supine Heel Slides: AROM;Both;10 reps;Supine;AAROM Hip ABduction/ADduction: AROM;Both;10 reps;Supine Straight Leg Raises: AROM;Both;10 reps;Supine    General Comments        Pertinent Vitals/Pain Pain Assessment: Faces Faces Pain Scale: Hurts even more Pain Location: R  knee and R shoulder this session, no c/o R foot pain. Pain Descriptors / Indicators: Discomfort;Grimacing Pain Intervention(s): Monitored during session;Repositioned    Home Living                      Prior Function            PT Goals (current goals can now be found in the care plan section) Acute Rehab PT Goals Patient Stated Goal: return home to wife Potential to Achieve Goals: Fair Progress towards PT goals: Progressing toward goals    Frequency    Min 4X/week (will keep 4x week until snf is confirmed.)      PT Plan Current plan remains appropriate    Co-evaluation              AM-PAC PT "6 Clicks" Mobility   Outcome Measure  Help needed turning from your back to your side while in a flat bed without using bedrails?: None Help needed moving from lying on your back to sitting on the side of a flat bed without using bedrails?: A Little Help needed moving to and from a bed to a chair (including a wheelchair)?: A Little Help needed standing up from a chair using your arms (e.g., wheelchair or bedside chair)?: A Little Help needed to walk in hospital room?: A Lot Help needed climbing 3-5 steps with a railing? : A Lot 6 Click Score: 17    End of Session Equipment Utilized During Treatment: Gait belt Activity Tolerance: Patient tolerated treatment well Patient left: with call bell/phone within reach;in chair;with chair alarm set Nurse Communication: Mobility status PT Visit Diagnosis: Unsteadiness on feet (R26.81);Other abnormalities of gait and mobility (R26.89);Muscle weakness (generalized) (M62.81)     Time: 0539-7673 PT Time Calculation (min) (ACUTE ONLY): 21 min  Charges:  $Therapeutic Activity: 8-22 mins                     Bonney Leitz , PTA Acute Rehabilitation Services Pager [phone] Office [phone]     Cordie Beazley Artis Delay 05/18/2020, 11:52 AM

## 2020-05-18 NOTE — TOC Progression Note (Addendum)
Transition of Care [facility]) - Progression Note    Patient Details  Name: Dylan Harrington MRN: [id number] Date of Birth: 11-09-1940  [facility] CM/SW Contact  Erin Sons, [place] Phone Number: 05/18/2020, 8:36 AM  Clinical Narrative:     CSW called [facility] and updated admissions staff. They will review pt.   CSW called and left message with [facility] requesting return call.   CSW attempted to call [employer] [facility]. Contact# [phone] for Caryn Bee in admissions. He provided fax number. CSW faxed clinicals. Caryn Bee called CSW back and informed him that pt's insurance is out of network and has no coverage for out of network facilities.   Called and left message with [facility] [phone]; Gerri Spore called CSW back. They can offer a bed  [facility] [phone]; they can offer on pt if pt's family can take pt to his follow up appointment with Dr. Lajoyce Corners in [place].   CSW called and notified pt's Grandaughter of offers. She will review with pt's daughter and notify CSW of  Choice.   1200 Graddaughter called CSW and informed CSW of choice for [facility]. Daughter explains transportation concerns for tomorrow that family would not be able to transport pt until late evening, around 8pm. CSW explained that SNF may take pt that late and also clarified ambulance transport costs. Daughter is requesting an additional day for transportation.   1235: CSW called [facility] and confirmed they can take pt late in the evening, even at midnight. CSW called Granddaughter back and informed her that pt is medically stable for d/c tomorrow. CSW explained that [facility] can accept [facility] late in the evening. Granddaughter currently plans to get pt after work tomorrow around [employer] and then transport pt to facility.   1245: CSW attempted to update auth. CSW is informed that [facility] is not in network with pt's specific [employer] plan. They stated that  [facility] is in network. [facility]. The latest they can accept pt is 730pm. CSW called and notified pt's granddaughter. She will arrange transportation. CSW called [employer] and added [employer] to [place]. Request  [id number]. CSW faxed updated clinicals  1419:  Auth received. Berkley Harvey RF#[id number]. DJT#[id number]. Auth approved from 05/18/20 -- 05/20/20. Care Coordinator is Lorie Phenix. CSW notified [facility] with [facility] [employer]  Expected Discharge Plan: Home w Home Health Services Barriers to Discharge: Continued Medical Work up  Expected Discharge Plan and Services Expected Discharge Plan: Home w Home Health Services   Discharge Planning Services: CM Consult Post Acute Care Choice: Skilled Nursing Facility Living arrangements for the past 2 months: Single Family Home                 DME Arranged: Wheelchair manual DME Agency: [employer] Date DME Agency Contacted: 05/14/20 Time DME Agency Contacted: [phone] Representative spoke with at DME Agency: [employer] HH Arranged: PT,OT,RN,Nurse's Aide,Social Work [employer] Agency: [facility] Date [facility] Agency Contacted: 05/13/20 Time HH Agency Contacted: 1450 Representative spoke with at [facility] Agency: Kandee Keen   Social Determinants of Health (SDOH) Interventions    Readmission Risk Interventions No flowsheet data found.

## 2020-05-18 NOTE — Progress Notes (Addendum)
PROGRESS NOTE    Dylan Harrington  [id number] DOB: 1940/11/12 DOA: 05/07/2020 PCP: Winifred OliveKirkman, Paul, MD    Chief Complaint  Patient presents with  . Foot Problem  . Wound Infection    Brief Narrative:   80yo w/ a hx of COPD, OSA, HTN, BPH, tobacco abuse, and neuropathy who presented to the ER with worsening right foot pain after having stepped on sharp object 10 days prior resulting in a wound to the bottom of his foot.  He had progressive erythema and purulent drainage despite use of Bactrim in the outpatient setting per his PCP.  Following admission the patient was treated with IV vancomycin and meropenem.  He underwent excisional debridement and wound VAC placement per Dr. Lajoyce Cornersuda 3/26.  Superficial wound cultures revealed MRSA.  ID directing antibiotic therapy.  Subjective:  tmax 98.3, Denies pain, he is wondering when the wound vac can come off  Appear slow to answer questions, today is the first time he state his year 2022 , the past few days he has been saying  it is 2021, oriented to the month and place  No wheezing , does not appear to have dyspnea while laying in bed   He remains very weak and  relies on heavy assistance for ADLs per PT   Wound vac to right foot   Assessment & Plan:   Principal Problem:   Cellulitis of right foot Active Problems:   COPD (chronic obstructive pulmonary disease) (HCC)   Essential hypertension   Skin ulcer of plantar aspect of foot (HCC)   Nicotine dependence, cigarettes, uncomplicated   BPH without urinary obstruction   Lactic acidosis   Hypokalemia   Cutaneous abscess of right foot   Protein-calorie malnutrition, severe   Foot abscess, right   Right foot cellulitis/abscess, sepsis present on admission -Received Tdap vaccine during this hospitalization (stepped on sharp object 10 days prior resulting in a wound to the bottom of his foot.) -Status post I&D on March 26 -Currently on vancomycin and Flagyl, Plan to discharge  on Zyvox ( Can switch to linezolid 600 mg p.o. twice daily for 2 weeks from 3/29), has appointment with ID Dr Elinor ParkinsonManandhar on May 26, 2020 at 3:45 PM -Dr. Lajoyce Cornersuda plans to remove wound VAC tomorrow, then patient can proceed with  skilled nursing facility discharge   Possible prediabetes A1c was checked on admission, A1c is 6.1% Lenient control in a 80 year old patient  Right knee pain Concerning for joint fluids Knee x ray + effusion Evaluated by ortho, recommend outpatient follow up, details please see ortho note from 4/1 am Right knee pain resolved  AKI on CKD 2 -Resolved,  -Home medication lisinopril HCTZ held since admission  Hypertension Blood pressure stable on metoprolol, continue hold lisinopril HCTZ  COPD/tobacco use -Mild wheezing heard on exam today, will get chest x-ray, add on DuoNeb -Encourage smoking cessation  BPH Continue Flomax  Peripheral neuropathy Continue Neurontin  OSA Encourage CPAP compliance He declined CPAP at night in the hospital  Nutritional Assessment: The patient's BMI is: Body mass index is 28.2 kg/m.Marland Kitchen.Seen by dietician.  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     Unresulted Labs (From admission, onward)  Start     Ordered   05/18/20 1300  Vancomycin, trough  Once-Timed,   TIMED       Question:  Specimen collection method  Answer:  Lab=Lab collect  "And" Linked Group Details   05/17/20 1141            DVT prophylaxis: SCDs Start: 05/09/20 1054 enoxaparin (LOVENOX) injection 40 mg Start: 05/08/20  1000   Code Status: Full Family Communication: daughter at bedside on 3/31, over the phone on 4/1, 4/2 , 4/4, granddaughter over the phone on 4/3 Disposition:   Status is: Inpatient   Dispo: The patient is from: home              Anticipated d/c is to: SNF               Anticipated d/c date is: 4/5,  medically stable to discharge                Consultants:   Orthopedics  ID  Procedures:  Status post I&D March 26 right foot  Antimicrobials:    Anti-infectives (From admission, onward)   Start     Dose/Rate Route Frequency Ordered Stop   05/11/20 1400  metroNIDAZOLE (FLAGYL) tablet 500 mg        500 mg Oral Every 8 hours 05/11/20 1129     05/08/20 1000  vancomycin (VANCOREADY) IVPB 750 mg/150 mL        750 mg 150 mL/hr over 60 Minutes Intravenous Every 12 hours 05/08/20 0213     05/08/20 0215  meropenem (MERREM) 1 g in sodium chloride 0.9 % 100 mL IVPB  Status:  Discontinued        1 g 200 mL/hr over 30 Minutes Intravenous Every 8 hours 05/08/20 0211 05/11/20 1129   05/08/20 0215  vancomycin (VANCOREADY) IVPB 1500 mg/300 mL        1,500 mg 150 mL/hr over 120 Minutes Intravenous  Once 05/08/20 0211 05/08/20 0507         Objective: Vitals:   05/17/20 0326 05/17/20 0829 05/17/20 1502 05/18/20 0500  BP: 136/76 133/72 132/73 119/75  Pulse: 72 77 68 77  Resp: 15 16 16 18   Temp: 98.6 F (37 C) 98 F (36.7 C) 97.9 F (36.6 C) 98.2 F (36.8 C)  TempSrc:  Oral Oral Oral  SpO2: 95% 93% 94% 93%  Weight:      Height:        Intake/Output Summary (Last 24 hours) at 05/18/2020 0806 Last data filed at 05/18/2020 0731 Gross per 24 hour  Intake --  Output 1075 ml  Net -1075 ml   Filed Weights   05/07/20 2308 05/08/20 1355  Weight: 94.3 kg 94.3 kg    Examination:  General exam: frail elderly, chronically ill appearing, weak but NAD Respiratory system: no wheezing today,  Respiratory effort normal. Cardiovascular system: S1 & S2 heard, RRR. No JVD, no murmur, No  pedal edema. Gastrointestinal system: Abdomen is nondistended, soft and nontender.  Normal bowel sounds heard. Central nervous system: Alert and oriented. No focal neurological deficits. Extremities: generalized weakness, right foot post op changes, dressing and wound vac in place, right knee tenderness has resolved, does not appear to be swollen anymore Skin: No rashes, lesions or ulcers Psychiatry: aaox3 today, calm and cooperative     Data Reviewed: I have personally reviewed following labs and imaging studies  CBC: Recent Labs  Lab 05/11/20 0831 05/14/20 0258 05/15/20 0334  WBC 6.5 8.5 7.0  NEUTROABS  --  5.8 4.5  HGB 11.6* 11.0* 10.4*  HCT 35.7* 33.9* 32.1*  MCV 98.9 97.7 99.7  PLT 251 264 243    Basic Metabolic Panel: Recent Labs  Lab 05/11/20 0831 05/14/20 0258 05/15/20 0334 05/18/20 0458  NA 135 136 137  --   K 3.9 3.6 3.6  --   CL 102 106 106  --   CO2 30 26 24   --   GLUCOSE 169* 147* 100*  --   BUN 20 20 19   --   CREATININE 0.92 1.01 0.92 0.87  CALCIUM 8.0* 7.9* 7.6*  --   MG 2.0 1.9 2.0  --   PHOS 2.0*  --   --   --     GFR: Estimated Creatinine Clearance: 82.1 mL/min (by C-G formula based on SCr of 0.87 mg/dL).  Liver Function Tests: Recent Labs  Lab 05/11/20 0831 05/14/20 0258  AST  --  20  ALT  --  12  ALKPHOS  --  38  BILITOT  --  1.0  PROT  --  6.9  ALBUMIN 2.0* 1.8*    CBG: No results for input(s): GLUCAP in the last 168 hours.   Recent Results (from the past 240 hour(s))  MRSA PCR Screening     Status: Abnormal   Collection Time: 05/09/20  7:41 AM   Specimen: Nasal Mucosa; Nasopharyngeal  Result Value Ref Range Status   MRSA by PCR POSITIVE (A) NEGATIVE Final    Comment:        The GeneXpert MRSA Assay (FDA approved for NASAL specimens only), is one component of a comprehensive MRSA colonization surveillance program. It is not intended to diagnose MRSA infection nor to guide or monitor treatment for MRSA  infections. RESULT CALLED TO, READ BACK BY AND VERIFIED WITH: RN MARISSA [phone] MLM Performed at [facility], [address] [place], [address] [place]   Aerobic/Anaerobic Culture w Gram Stain (surgical/deep wound)     Status: None   Collection Time: 05/09/20  9:18 AM   Specimen: PATH Soft tissue  Result Value Ref Range Status   Specimen Description TISSUE  Final   Special Requests FOOT RIGHT  Final   Gram Stain NO WBC SEEN NO ORGANISMS SEEN   Final   Culture   Final    MODERATE METHICILLIN RESISTANT STAPHYLOCOCCUS AUREUS CRITICAL RESULT CALLED TO, READ BACK BY AND VERIFIED WITH: RN DOROTHY M. [id number] FCP NO ANAEROBES ISOLATED Performed at [facility], [address], [place], [address] [place]    Report Status 05/14/2020 FINAL  Final   Organism ID, Bacteria METHICILLIN RESISTANT STAPHYLOCOCCUS AUREUS  Final      Susceptibility   Methicillin resistant staphylococcus aureus - MIC*    CIPROFLOXACIN >=8 RESISTANT Resistant     ERYTHROMYCIN >=8 RESISTANT Resistant     GENTAMICIN <=0.5 SENSITIVE Sensitive     OXACILLIN >=4 RESISTANT Resistant     TETRACYCLINE >=16 RESISTANT Resistant     VANCOMYCIN <=0.5 SENSITIVE Sensitive     TRIMETH/SULFA >=320 RESISTANT Resistant     CLINDAMYCIN <=0.25 SENSITIVE Sensitive     RIFAMPIN <=0.5 SENSITIVE Sensitive     Inducible Clindamycin NEGATIVE Sensitive     * MODERATE METHICILLIN RESISTANT STAPHYLOCOCCUS AUREUS  SARS CORONAVIRUS 2 (TAT 6-24 HRS) Nasopharyngeal Nasopharyngeal Swab     Status: None   Collection Time: 05/14/20 10:12 PM   Specimen: Nasopharyngeal Swab  Result Value Ref Range Status   SARS Coronavirus 2 NEGATIVE NEGATIVE Final  Comment: (NOTE) SARS-CoV-2 target nucleic acids are NOT DETECTED.  The SARS-CoV-2 RNA is generally detectable in upper and lower respiratory specimens during the acute phase of infection. Negative results do not preclude SARS-CoV-2 infection, do not rule out co-infections  with other pathogens, and should not be used as the sole basis for treatment or other patient management decisions. Negative results must be combined with clinical observations, patient history, and epidemiological information. The expected result is Negative.  Fact Sheet for Patients: HairSlick.no  Fact Sheet for Healthcare Providers: quierodirigir.com  This test is not yet approved or cleared by the [place] FDA and  has been authorized for detection and/or diagnosis of SARS-CoV-2 by FDA under an Emergency Use Authorization (EUA). This EUA will remain  in effect (meaning this test can be used) for the duration of the COVID-19 declaration under Se ction 564(b)(1) of the Act, 21 U.S.C. section 360bbb-3(b)(1), unless the authorization is terminated or revoked sooner.  Performed at [facility], [address], [place], [place] [address]          Radiology Studies: No results found.      Scheduled Meds: . docusate sodium  100 mg Oral BID  . enoxaparin (LOVENOX) injection  40 mg Subcutaneous Q24H  . feeding supplement  237 mL Oral TID BM  . gabapentin  300 mg Oral QHS  . metoprolol tartrate  25 mg Oral Daily  . metroNIDAZOLE  500 mg Oral Q8H  . multivitamin with minerals  1 tablet Oral Daily  . tamsulosin  0.4 mg Oral Daily   Continuous Infusions: . vancomycin 750 mg (05/18/20 0146)     LOS: 10 days   Time spent: Greater than 50% of this time was spent in counseling, explanation of diagnosis, planning of further management, and coordination of care.   Voice Recognition Reubin Milan dictation system was used to create this note, attempts have been made to correct errors. Please contact the author with questions and/or clarifications.   Albertine Grates, MD PhD FACP [facility]  Available via Epic secure chat 7am-7pm for nonurgent issues Please page for urgent issues To page the attending  provider between 7A-7P or the covering provider during after hours 7P-7A, please log into the web site [email or web address] and access using universal [place] password for that web site. If you do not have the password, please call the hospital operator.    05/18/2020, 8:06 AM

## 2020-05-18 NOTE — Progress Notes (Signed)
Pharmacy Antibiotic Note  Dylan Harrington is a 80 y.o. male admitted on 05/07/2020 with wound infection, s/p I&D with wound VAC placement on 05/09/20.   Pharmacy has been consulted for vancomycin dosing.    Renal function improving.  Afebrile, WBC WNL. Previous vancomycin AUC therapeutic at 419 (goal 400-550). Vancomycin trough today (13 mcg/mL), similar to previous value.  Plan: Continue vanc 750 mg IV Q12H Flagyl 500mg  PO Q8H per MD Monitor renal fxn, clinical progress, weekly vanc levels Plan to discharge on Zyvox, has appointment with ID    Height: 6' (182.9 cm) Weight: 94.3 kg (207 lb 14.3 oz) IBW/kg (Calculated) : 77.6  Temp (24hrs), Avg:97.9 F (36.6 C), Min:97.6 F (36.4 C), Max:98.2 F (36.8 C)  Recent Labs  Lab 05/11/20 1612 05/12/20 0225 05/14/20 0258 05/15/20 0334 05/18/20 0458 05/18/20 1312  WBC  --   --  8.5 7.0  --   --   CREATININE  --   --  1.01 0.92 0.87  --   VANCOTROUGH  --  12*  --   --   --  13*  VANCOPEAK 22*  --   --   --   --   --     Estimated Creatinine Clearance: 82.1 mL/min (by C-G formula based on SCr of 0.87 mg/dL).    Allergies  Allergen Reactions  . Keflex [Cephalexin] Hives, Rash and Other (See Comments)     Bactrim PTA Merrem 3/25>>3/28 Flagyl 3/28>> Vanc 3/25>>  3/28: VP/VT 22/12, AUC 419 >> con't 750 q12 4/4 VT 13 >> no change  3/26 MRSA PCR - positive 3/25 Right foot - MRSA 3/24 BCx - negative  Gizella Belle[place] D. 4/24, PharmD, BCPS, BCCCP 05/18/2020, 3:02 PM

## 2020-05-18 NOTE — Care Management Important Message (Signed)
Important Message  Patient Details  Name: Dylan Harrington MRN: [id number] Date of Birth: October 30, 1940   Medicare Important Message Given:  Yes - Important Message mailed due to current National Emergency   Verbal consent obtained due to current National Emergency  Relationship to patient: Self Contact Name: [name] Call Date: 05/14/20  Time: 1118 Phone: [phone] Outcome: No Answer/Busy Important Message mailed to: Patient address on file     [name] 05/18/2020, 2:49 PM

## 2020-05-19 ENCOUNTER — Other Ambulatory Visit (HOSPITAL_COMMUNITY): Payer: Self-pay

## 2020-05-19 DIAGNOSIS — L03115 Cellulitis of right lower limb: Secondary | ICD-10-CM | POA: Diagnosis not present

## 2020-05-19 MED ORDER — ZOLPIDEM TARTRATE 5 MG PO TABS: 5.0000 mg | ORAL_TABLET | Freq: Every evening | ORAL | 0 refills | Status: AC | PRN

## 2020-05-19 MED ORDER — POLYETHYLENE GLYCOL 3350 17 G PO PACK: 17.0000 g | PACK | Freq: Every day | ORAL | 0 refills | Status: AC | PRN

## 2020-05-19 MED ORDER — ZOLPIDEM TARTRATE 5 MG PO TABS: 5.0000 mg | ORAL_TABLET | Freq: Every evening | ORAL | Status: DC | PRN

## 2020-05-19 MED ORDER — LINEZOLID 600 MG PO TABS
600.0000 mg | ORAL_TABLET | Freq: Two times a day (BID) | ORAL | 0 refills | Status: AC
  Filled 2020-05-19: qty 22, 11d supply, fill #0

## 2020-05-19 MED ORDER — OXYCODONE HCL 5 MG PO TABS: 5.0000 mg | ORAL_TABLET | Freq: Four times a day (QID) | ORAL | 0 refills | Status: AC | PRN

## 2020-05-19 NOTE — Progress Notes (Signed)
Patient is very comfortable and doing well this morning appropriate and answers questions.  Wound VAC was removed without difficulty.  Penrose drain was removed intact easily.  There is some skin maceration but no drainage no surrounding cellulitis swelling is well controlled well apposed wound edges  Ordered  daily dry dressing changes may cleanse with antibacterial soap and water.  Should follow-up in our office in 1 week

## 2020-05-19 NOTE — Discharge Summary (Signed)
Physician Discharge Summary  Dylan Harrington UEA:[id number] DOB: October 26, 1940 DOA: 05/07/2020  PCP: Winifred Olive, MD  Admit date: 05/07/2020 Discharge date: 05/19/2020  Admitted From: Home Disposition:  SNF Discharge Condition:Stable CODE STATUS:FULL Diet recommendation: Heart Healthy    Brief/Interim Summary:  80yo w/ a hx ofCOPD, OSA, HTN, BPH, tobacco abuse, and neuropathy who presented to the ER with worsening right foot pain after having stepped on sharp object 10 days prior resulting in a wound to the bottom of his foot. He had progressive erythema and purulent drainage despite use of Bactrim in the outpatient setting per his PCP. Following admission, the patient was treated with IV vancomycin and meropenem. He underwent excisional debridement and wound VAC placement per Dr. Lajoyce Corners 3/26. Superficial wound cultures revealed MRSA. ID was consulted for  antibiotic therapy.  PT/OT recommended skilled facility on discharge.  ID recommended linezolid on discharge.  Wound VAC has been removed.  He will follow-up with ID and orthopedics as an outpatient.  Following problems were addressed during his hospitalization:  Right foot cellulitis/abscess, sepsis present on admission -Received Tdap vaccine during this hospitalization (stepped on sharp object 10 days prior resulting in a wound to the bottom of his foot.) -Status post I&D on March 26 -He was on vancomycin and Flagyl, Plan to discharge on Zyvox (Can switch to linezolid 600 mg p.o. twice daily for 2 weeks from 3/29), has appointment with ID Dr Elinor Parkinson on May 26, 2020 at 3:45 PM -Dr. Lajoyce Corners removed wound [facility] tomorrow, then patient can proceed with  skilled nursing facility discharge   Possible prediabetes A1c was checked on admission, A1c is 6.1% Lenient control in a 80 year old patient  Right knee pain Concerning for joint fluids Knee x ray + effusion Evaluated by ortho, recommend outpatient follow up Right knee pain  resolved  AKI on CKD 2 -Resolved,  -Home medication lisinopril HCTZ held since admission  Hypertension Blood pressure stable on metoprolol, continue hold lisinopril HCTZ  COPD/tobacco use -Respiratory status stable -Encourage smoking cessation  BPH Continue Flomax  Peripheral neuropathy Continue Neurontin  OSA Encourage CPAP compliance He declined CPAP at night in the hospital   Discharge Diagnoses:  Principal Problem:   Cellulitis of right foot Active Problems:   COPD (chronic obstructive pulmonary disease) (HCC)   Essential hypertension   Skin ulcer of plantar aspect of foot (HCC)   Nicotine dependence, cigarettes, uncomplicated   BPH without urinary obstruction   Lactic acidosis   Hypokalemia   Cutaneous abscess of right foot   Protein-calorie malnutrition, severe   Foot abscess, right    Discharge Instructions  Discharge Instructions    Apply dressing   Complete by: As directed    Cleanse foot wound daily with antibacterial soap and water.  Apply clean dry dressing daily   Diet - low sodium heart healthy   Complete by: As directed    Discharge instructions   Complete by: As directed    1)Please take prescribed medications as instructed. 2)Follow up with ID and orthopedics as an outpatient.  Name and number  of the providers have been attached.   Discharge wound care:   Complete by: As directed    As above   Increase activity slowly   Complete by: As directed      Allergies as of 05/19/2020      Reactions   Keflex [cephalexin] Hives, Rash, Other (See Comments)      Medication List    STOP taking these medications   lisinopril-hydrochlorothiazide 20-25  MG tablet Commonly known as: ZESTORETIC     TAKE these medications   albuterol 108 (90 Base) MCG/ACT inhaler Commonly known as: VENTOLIN HFA Inhale 2 puffs into the lungs 4 (four) times daily.   docusate sodium 100 MG capsule Commonly known as: COLACE Take 1 capsule (100 mg total) by  mouth every 12 (twelve) hours.   gabapentin 100 MG capsule Commonly known as: NEURONTIN Take 300 mg by mouth at bedtime.   guaiFENesin 600 MG 12 hr tablet Commonly known as: MUCINEX Take 600 mg by mouth 2 (two) times daily.   linezolid 600 MG tablet Commonly known as: ZYVOX Take 1 tablet (600 mg total) by mouth 2 (two) times daily for 11 days.   metoprolol tartrate 25 MG tablet Commonly known as: LOPRESSOR Take 25 mg by mouth daily.   oxyCODONE 5 MG immediate release tablet Commonly known as: Oxy IR/ROXICODONE Take 1 tablet (5 mg total) by mouth every 6 (six) hours as needed for moderate pain (pain score 4-6).   polyethylene glycol 17 g packet Commonly known as: MIRALAX / GLYCOLAX Take 17 g by mouth daily as needed for mild constipation.   tamsulosin 0.4 MG Caps capsule Commonly known as: Flomax Take 1 capsule (0.4 mg total) by mouth daily.   zolpidem 5 MG tablet Commonly known as: AMBIEN Take 1 tablet (5 mg total) by mouth at bedtime as needed for sleep. What changed:   medication strength  how much to take  when to take this  reasons to take this            Durable Medical Equipment  (From admission, onward)         Start     Ordered   05/14/20 1555  For home use only DME lightweight manual wheelchair with seat cushion  Once       Comments: Patient suffers from I &D of R foot abcess with placement of wound vac which impairs their ability to perform daily activities like walking in the home.  A rolling walker will not resolve  issue with performing activities of daily living. A wheelchair will allow patient to safely perform daily activities. Patient is not able to propel themselves in the home using a standard weight wheelchair due to weakness. Patient can self propel in the lightweight wheelchair. Length of need 12 months Accessories: elevating leg rests (ELRs), wheel locks, extensions and anti-tippers.   05/14/20 1556           Discharge Care  Instructions  (From admission, onward)         Start     Ordered   05/19/20 0000  Discharge wound care:       Comments: As above   05/19/20 [id number]          Follow-up Information    Nadara Mustarduda, Marcus V, MD In 1 week.   Specialty: Orthopedic Surgery Contact information: [address] [place] [address] [phone]        [facility] Follow up.   Specialty: Home Health Services Contact information: [address] [place] [address] [phone]        Odette FractionManandhar, Sabina, MD Follow up on 05/26/2020.   Specialty: Infectious Diseases Why: 3:45pm Contact information: [address] [place] [address] [phone]              Allergies  Allergen Reactions  . Keflex [Cephalexin] Hives, Rash and Other (See Comments)    Consultations:  Orthopedics,ID   Procedures/Studies: DG Chest 2 View  Result Date: 05/07/2020 CLINICAL DATA:  Right foot pain, drainage, sepsis EXAM: CHEST - 2 VIEW COMPARISON:  03/14/2016 FINDINGS: Frontal and lateral views of the chest demonstrate stable dual lead pacer. Cardiac silhouette is unremarkable. No airspace disease, effusion, or pneumothorax. Stable degenerative changes right shoulder. No acute bony abnormalities. IMPRESSION: 1. No acute intrathoracic process. Electronically Signed   By: Sharlet Salina M.D.   On: 05/07/2020 23:37   DG Knee 1-2 Views Right  Result Date: 05/14/2020 CLINICAL DATA:  Right knee pain and swelling. Recent right foot surgery. EXAM: RIGHT KNEE - 1-2 VIEW COMPARISON:  None. FINDINGS: No fracture or dislocation. 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 Tibiofemoral alignment is maintained. Moderate chondrocalcinosis. There is a moderate knee joint effusion. Advanced vascular calcifications. No fracture, erosion, or bony destruction. IMPRESSION: 1. Moderate knee joint effusion. 2. No acute osseous abnormality. 3. Patellofemoral osteoarthritis.   Chondrocalcinosis. 4. Advanced vascular calcifications. Electronically Signed   By: Narda Rutherford M.D.   On: 05/14/2020 15:17   DG CHEST PORT 1 VIEW  Result Date: 05/13/2020 CLINICAL DATA:  Wheezing EXAM: PORTABLE CHEST 1 VIEW COMPARISON:  Portable exam 1659 hours compared to 05/07/2020 FINDINGS: LEFT subclavian pacemaker leads project over RIGHT atrium and RIGHT ventricle unchanged. Accentuation of heart size. Atherosclerotic calcification and tortuosity of thoracic aorta. Pulmonary vascularity normal. Mild LEFT basilar atelectasis. No definite infiltrate, pleural effusion or pneumothorax. Bones demineralized with advanced RIGHT glenohumeral degenerative changes. IMPRESSION: RIGHT basilar atelectasis. Aortic Atherosclerosis (ICD10-I70.0). Electronically Signed   By: Ulyses Southward M.D.   On: 05/13/2020 17:44   DG Foot Complete Right  Result Date: 05/08/2020 CLINICAL DATA:  Infection EXAM: RIGHT FOOT COMPLETE - 3+ VIEW COMPARISON:  None. FINDINGS: There is no evidence of fracture or dislocation. 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 There is calcaneal enthesopathy. Focal area of ulceration on the mid plantar surface with overlying subcutaneous edema. IMPRESSION: Area of ulceration on the plantar surface with overlying soft tissue swelling. No definite evidence of acute osteomyelitis. Electronically Signed   By: Jonna Clark M.D.   On: 05/08/2020 01:40       Subjective: Patient seen and examined the bedside this morning.  Hemodynamically stable.  Comfortable but denies any complaints  Discharge Exam: Vitals:   05/18/20 2033 05/19/20 0750  BP: (!) 141/67 123/62  Pulse: 70 91  Resp: 16 18  Temp: 98.3 F (36.8 C) 98.1 F (36.7 C)  SpO2: 96% 93%   Vitals:   05/18/20 0816 05/18/20 1534 05/18/20 2033 05/19/20 0750  BP: 121/69 108/72 (!) 141/67 123/62  Pulse: 77 68 70 91  Resp: Temp: 97.6 F (36.4 C) 98.3 F (36.8 C) 98.3 F (36.8 C) 98.1 F (36.7 C)   TempSrc: Oral Oral Oral Oral  SpO2: 92% 95% 96% 93%  Weight:      Height:        General: Pt is alert, awake, not in acute distress Cardiovascular: RRR, S1/S2 +, no rubs, no gallops Respiratory: CTA bilaterally, no wheezing, no rhonchi Abdominal: Soft, NT, ND, bowel sounds + Extremities: no edema, no cyanosis, dressing on the right foot    The results of significant diagnostics from this hospitalization (including imaging, microbiology, ancillary and laboratory) are listed below for reference.     Microbiology: Recent Results (from the past 240 hour(s))  Aerobic/Anaerobic Culture w Gram Stain (surgical/deep wound)     Status: None  Collection Time: 05/09/20  9:18 AM   Specimen: PATH Soft tissue  Result Value Ref Range Status   Specimen Description TISSUE  Final   Special Requests FOOT RIGHT  Final   Gram Stain NO WBC SEEN NO ORGANISMS SEEN   Final   Culture   Final    MODERATE METHICILLIN RESISTANT STAPHYLOCOCCUS AUREUS CRITICAL RESULT CALLED TO, READ BACK BY AND VERIFIED WITH: RN Kelli Churn. [phone] FCP NO ANAEROBES ISOLATED Performed at [facility], [address] [place], [place] [address]    Report Status 05/14/2020 FINAL  Final   Organism ID, Bacteria METHICILLIN RESISTANT STAPHYLOCOCCUS AUREUS  Final      Susceptibility   Methicillin resistant staphylococcus aureus - MIC*    CIPROFLOXACIN >=8 RESISTANT Resistant     ERYTHROMYCIN >=8 RESISTANT Resistant     GENTAMICIN <=0.5 SENSITIVE Sensitive     OXACILLIN >=4 RESISTANT Resistant     TETRACYCLINE >=16 RESISTANT Resistant     VANCOMYCIN <=0.5 SENSITIVE Sensitive     TRIMETH/SULFA >=320 RESISTANT Resistant     CLINDAMYCIN <=0.25 SENSITIVE Sensitive     RIFAMPIN <=0.5 SENSITIVE Sensitive     Inducible Clindamycin NEGATIVE Sensitive     * MODERATE METHICILLIN RESISTANT STAPHYLOCOCCUS AUREUS  SARS CORONAVIRUS 2 (TAT 6-24 HRS) Nasopharyngeal Nasopharyngeal Swab     Status: None   Collection Time:  05/14/20 10:12 PM   Specimen: Nasopharyngeal Swab  Result Value Ref Range Status   SARS Coronavirus 2 NEGATIVE NEGATIVE Final    Comment: (NOTE) SARS-CoV-2 target nucleic acids are NOT DETECTED.  The SARS-CoV-2 RNA is generally detectable in upper and lower respiratory specimens during the acute phase of infection. Negative results do not preclude SARS-CoV-2 infection, do not rule out co-infections with other pathogens, and should not be used as the sole basis for treatment or other patient management decisions. Negative results must be combined with clinical observations, patient history, and epidemiological information. The expected result is Negative.  Fact Sheet for Patients: HairSlick.no  Fact Sheet for Healthcare Providers: quierodirigir.com  This test is not yet approved or cleared by the [place] FDA and  has been authorized for detection and/or diagnosis of SARS-CoV-2 by FDA under an Emergency Use Authorization (EUA). This EUA will remain  in effect (meaning this test can be used) for the duration of the COVID-19 declaration under Se ction 564(b)(1) of the Act, 21 U.S.C. section 360bbb-3(b)(1), unless the authorization is terminated or revoked sooner.  Performed at [facility], [address] [place], [place] [address]   SARS CORONAVIRUS 2 (TAT 6-24 HRS) Nasopharyngeal Nasopharyngeal Swab     Status: None   Collection Time: 05/18/20 12:27 PM   Specimen: Nasopharyngeal Swab  Result Value Ref Range Status   SARS Coronavirus 2 NEGATIVE NEGATIVE Final    Comment: (NOTE) SARS-CoV-2 target nucleic acids are NOT DETECTED.  The SARS-CoV-2 RNA is generally detectable in upper and lower respiratory specimens during the acute phase of infection. Negative results do not preclude SARS-CoV-2 infection, do not rule out co-infections with other pathogens, and should not be used as the sole basis for treatment or  other patient management decisions. Negative results must be combined with clinical observations, patient history, and epidemiological information. The expected result is Negative.  Fact Sheet for Patients: HairSlick.no  Fact Sheet for Healthcare Providers: quierodirigir.com  This test is not yet approved or cleared by the [place] FDA and  has been authorized for detection and/or diagnosis of SARS-CoV-2 by  FDA under an Emergency Use Authorization (EUA). This EUA will remain  in effect (meaning this test can be used) for the duration of the COVID-19 declaration under Se ction 564(b)(1) of the Act, 21 U.S.C. section 360bbb-3(b)(1), unless the authorization is terminated or revoked sooner.  Performed at [facility], [address], [place], [place] [address]      Labs: BNP (last 3 results) No results for input(s): BNP in the last 8760 hours. Basic Metabolic Panel: Recent Labs  Lab 05/14/20 0258 05/15/20 0334 05/18/20 0458  NA 136 137  --   K 3.6 3.6  --   CL 106 106  --   CO2 26 24  --   GLUCOSE 147* 100*  --   BUN 20 19  --   CREATININE 1.01 0.92 0.87  CALCIUM 7.9* 7.6*  --   MG 1.9 2.0  --    Liver Function Tests: Recent Labs  Lab 05/14/20 0258  AST 20  ALT 12  ALKPHOS 38  BILITOT 1.0  PROT 6.9  ALBUMIN 1.8*   No results for input(s): LIPASE, AMYLASE in the last 168 hours. No results for input(s): AMMONIA in the last 168 hours. CBC: Recent Labs  Lab 05/14/20 0258 05/15/20 0334  WBC 8.5 7.0  NEUTROABS 5.8 4.5  HGB 11.0* 10.4*  HCT 33.9* 32.1*  MCV 97.7 99.7  PLT 264 243   Cardiac Enzymes: No results for input(s): CKTOTAL, CKMB, CKMBINDEX, TROPONINI in the last 168 hours. BNP: Invalid input(s): POCBNP CBG: No results for input(s): GLUCAP in the last 168 hours. D-Dimer No results for input(s): DDIMER in the last 72 hours. Hgb A1c No results for input(s): HGBA1C in the last 72  hours. Lipid Profile No results for input(s): CHOL, HDL, LDLCALC, TRIG, CHOLHDL, LDLDIRECT in the last 72 hours. Thyroid function studies No results for input(s): TSH, T4TOTAL, T3FREE, THYROIDAB in the last 72 hours.  Invalid input(s): FREET3 Anemia work up No results for input(s): VITAMINB12, FOLATE, FERRITIN, TIBC, IRON, RETICCTPCT in the last 72 hours. Urinalysis    Component Value Date/Time   COLORURINE AMBER (A) 05/07/2020 2310   APPEARANCEUR HAZY (A) 05/07/2020 2310   LABSPEC 1.026 05/07/2020 2310   PHURINE 5.0 05/07/2020 2310   GLUCOSEU NEGATIVE 05/07/2020 2310   HGBUR NEGATIVE 05/07/2020 2310   BILIRUBINUR NEGATIVE 05/07/2020 2310   KETONESUR NEGATIVE 05/07/2020 2310   PROTEINUR 30 (A) 05/07/2020 2310   UROBILINOGEN 2.0 (H) 08/20/2014 2345   NITRITE NEGATIVE 05/07/2020 2310   LEUKOCYTESUR NEGATIVE 05/07/2020 2310   Sepsis Labs Invalid input(s): PROCALCITONIN,  WBC,  LACTICIDVEN Microbiology Recent Results (from the past 240 hour(s))  Aerobic/Anaerobic Culture w Gram Stain (surgical/deep wound)     Status: None   Collection Time: 05/09/20  9:18 AM   Specimen: PATH Soft tissue  Result Value Ref Range Status   Specimen Description TISSUE  Final   Special Requests FOOT RIGHT  Final   Gram Stain NO WBC SEEN NO ORGANISMS SEEN   Final   Culture   Final    MODERATE METHICILLIN RESISTANT STAPHYLOCOCCUS AUREUS CRITICAL RESULT CALLED TO, READ BACK BY AND VERIFIED WITH: RN DOROTHY M. [phone] FCP NO ANAEROBES ISOLATED Performed at [facility], [address] [place], [place] [address]    Report Status 05/14/2020 FINAL  Final   Organism ID, Bacteria METHICILLIN RESISTANT STAPHYLOCOCCUS AUREUS  Final      Susceptibility   Methicillin resistant staphylococcus aureus - MIC*    CIPROFLOXACIN >=8 RESISTANT  Resistant     ERYTHROMYCIN >=8 RESISTANT Resistant     GENTAMICIN <=0.5 SENSITIVE Sensitive     OXACILLIN >=4 RESISTANT Resistant     TETRACYCLINE >=16  RESISTANT Resistant     VANCOMYCIN <=0.5 SENSITIVE Sensitive     TRIMETH/SULFA >=320 RESISTANT Resistant     CLINDAMYCIN <=0.25 SENSITIVE Sensitive     RIFAMPIN <=0.5 SENSITIVE Sensitive     Inducible Clindamycin NEGATIVE Sensitive     * MODERATE METHICILLIN RESISTANT STAPHYLOCOCCUS AUREUS  SARS CORONAVIRUS 2 (TAT 6-24 HRS) Nasopharyngeal Nasopharyngeal Swab     Status: None   Collection Time: 05/14/20 10:12 PM   Specimen: Nasopharyngeal Swab  Result Value Ref Range Status   SARS Coronavirus 2 NEGATIVE NEGATIVE Final    Comment: (NOTE) SARS-CoV-2 target nucleic acids are NOT DETECTED.  The SARS-CoV-2 RNA is generally detectable in upper and lower respiratory specimens during the acute phase of infection. Negative results do not preclude SARS-CoV-2 infection, do not rule out co-infections with other pathogens, and should not be used as the sole basis for treatment or other patient management decisions. Negative results must be combined with clinical observations, patient history, and epidemiological information. The expected result is Negative.  Fact Sheet for Patients: HairSlick.no  Fact Sheet for Healthcare Providers: quierodirigir.com  This test is not yet approved or cleared by the [place] FDA and  has been authorized for detection and/or diagnosis of SARS-CoV-2 by FDA under an Emergency Use Authorization (EUA). This EUA will remain  in effect (meaning this test can be used) for the duration of the COVID-19 declaration under Se ction 564(b)(1) of the Act, 21 U.S.C. section 360bbb-3(b)(1), unless the authorization is terminated or revoked sooner.  Performed at [facility], [address], [place], [place] [address]   SARS CORONAVIRUS 2 (TAT 6-24 HRS) Nasopharyngeal Nasopharyngeal Swab     Status: None   Collection Time: 05/18/20 12:27 PM   Specimen: Nasopharyngeal Swab  Result Value Ref Range Status    SARS Coronavirus 2 NEGATIVE NEGATIVE Final    Comment: (NOTE) SARS-CoV-2 target nucleic acids are NOT DETECTED.  The SARS-CoV-2 RNA is generally detectable in upper and lower respiratory specimens during the acute phase of infection. Negative results do not preclude SARS-CoV-2 infection, do not rule out co-infections with other pathogens, and should not be used as the sole basis for treatment or other patient management decisions. Negative results must be combined with clinical observations, patient history, and epidemiological information. The expected result is Negative.  Fact Sheet for Patients: HairSlick.no  Fact Sheet for Healthcare Providers: quierodirigir.com  This test is not yet approved or cleared by the [place] FDA and  has been authorized for detection and/or diagnosis of SARS-CoV-2 by FDA under an Emergency Use Authorization (EUA). This EUA will remain  in effect (meaning this test can be used) for the duration of the COVID-19 declaration under Se ction 564(b)(1) of the Act, 21 U.S.C. section 360bbb-3(b)(1), unless the authorization is terminated or revoked sooner.  Performed at [facility], [address], [place], [place] [address]     Please note: You were cared for by a hospitalist during your hospital stay. Once you are discharged, your primary care physician will handle any further medical issues. Please note that NO REFILLS for any discharge medications will be authorized once you are discharged, as it is imperative that you return to your primary care physician (or establish a relationship with a primary care physician if you do not  have one) for your post hospital discharge needs so that they can reassess your need for medications and monitor your lab values.    Time coordinating discharge: 40 minutes  SIGNED:   Burnadette Pop, MD  [facility] 05/19/2020, 8:54 AM Pager  [phone]  If 7PM-7AM, please contact night-coverage [email or web address] Password [email or web address]

## 2020-05-19 NOTE — Progress Notes (Signed)
Physical Therapy Treatment Patient Details Name: Dylan Harrington MRN: [id number] DOB: Mar 02, 1940 Today's Date: 05/19/2020    History of Present Illness The pt is a 80 yo male presentingto ED on 3/24 with x1 week of R foot wound with drainage. Pt found to have infection , and is now s/p I &D of R foot abcess with placement of wound vac on 3/26. PMH includes: COPD, glaucoma, HTN, OSA, spine, knee, and shoulder surgery.    PT Comments    Pt supine in bed on arrival  Continued focus on LE strengthening and gt training.  He remains to improve slowly due to poor strength and innability to maintain weight bearing 100% of the time.  Pt continues to benefit from skilled rehab in a post acute setting to improve strength, function and decrease caregiver burden.     Follow Up Recommendations  SNF;Supervision for mobility/OOB (will need HHPT if he does not go SNF)     Equipment Recommendations  Rolling walker with 5" wheels;Wheelchair cushion (measurements PT);Wheelchair (measurements PT);3in1 (PT)    Recommendations for Other Services       Precautions / Restrictions Precautions Precautions: Fall Precaution Comments: wound vac Required Braces or Orthoses: Other Brace Other Brace: post-op shoe Restrictions Weight Bearing Restrictions: Yes RLE Weight Bearing: Non weight bearing Other Position/Activity Restrictions: Pt continues to have difficulty maintaining NWB.    Mobility  Bed Mobility Overal bed mobility: Needs Assistance Bed Mobility: Supine to Sit     Supine to sit: Supervision     General bed mobility comments: Able to move edge of bed unsupported.    Transfers Overall transfer level: Needs assistance Equipment used: Rolling walker (2 wheeled) Transfers: Sit to/from Stand Sit to Stand: Mod assist         General transfer comment: Cues for hand placement, forward weight shifting and keeping R LE NWB.  Poor eccentric load back to seated  surface.  Ambulation/Gait Ambulation/Gait assistance: Mod assist Gait Distance (Feet): 15 Feet Assistive device: Rolling walker (2 wheeled) Gait Pattern/deviations: Step-to pattern;Trunk flexed (hop to gt training.)     General Gait Details: Focused on hop steps and able to maintain weight bearing for 80% on the time this session but had difficulty maintaining when fatigued.  R shoulder is very weak too which limits ability to use BUEs for gt training.   Stairs             Wheelchair Mobility    Modified Rankin (Stroke Patients Only)       Balance Overall balance assessment: Needs assistance Sitting-balance support: Feet supported;No upper extremity supported Sitting balance-Leahy Scale: Good       Standing balance-Leahy Scale: Poor Standing balance comment: modA and BUE support with constant cues to maintain RLE NWB                            Cognition Arousal/Alertness: Awake/alert Behavior During Therapy: WFL for tasks assessed/performed;Impulsive Overall Cognitive Status: Impaired/Different from baseline Area of Impairment: Memory;Safety/judgement;Problem solving                     Memory: Decreased recall of precautions   Safety/Judgement: Decreased awareness of safety;Decreased awareness of deficits   Problem Solving: Requires verbal cues;Requires tactile cues;Slow processing        Exercises General Exercises - Lower Extremity Ankle Circles/Pumps: AROM;Both;Supine;20 reps Quad Sets: AROM;Both;10 reps;Supine Heel Slides: AROM;Both;10 reps;Supine Hip ABduction/ADduction: AROM;Both;10 reps;Supine Straight Leg Raises: AROM;Both;10 reps;Supine  General Comments        Pertinent Vitals/Pain Pain Assessment: Faces Faces Pain Scale: Hurts a little bit Pain Location: R shoulder denies pain in foot. Pain Descriptors / Indicators: Discomfort;Grimacing Pain Intervention(s): Monitored during session;Repositioned    Home Living                       Prior Function            PT Goals (current goals can now be found in the care plan section) Acute Rehab PT Goals Patient Stated Goal: return home to wife Potential to Achieve Goals: Fair Progress towards PT goals: Progressing toward goals    Frequency    Min 4X/week (will keep 4x week until snf is confirmed.)      PT Plan Current plan remains appropriate    Co-evaluation              AM-PAC PT "6 Clicks" Mobility   Outcome Measure  Help needed turning from your back to your side while in a flat bed without using bedrails?: None Help needed moving from lying on your back to sitting on the side of a flat bed without using bedrails?: A Little Help needed moving to and from a bed to a chair (including a wheelchair)?: A Little Help needed standing up from a chair using your arms (e.g., wheelchair or bedside chair)?: A Lot Help needed to walk in hospital room?: A Lot Help needed climbing 3-5 steps with a railing? : A Lot 6 Click Score: 16    End of Session Equipment Utilized During Treatment: Gait belt Activity Tolerance: Patient tolerated treatment well Patient left: with call bell/phone within reach;in chair;with chair alarm set Nurse Communication: Mobility status PT Visit Diagnosis: Unsteadiness on feet (R26.81);Other abnormalities of gait and mobility (R26.89);Muscle weakness (generalized) (M62.81)     Time: 0086-7619 PT Time Calculation (min) (ACUTE ONLY): 23 min  Charges:  $Gait Training: 8-22 mins $Therapeutic Exercise: 8-22 mins                     Bonney Leitz , PTA Acute Rehabilitation Services Pager [phone] Office [phone]     Sabine Tenenbaum Artis Delay 05/19/2020, 11:16 AM

## 2020-05-19 NOTE — Progress Notes (Signed)
Discharge note:  Pt A&Ox4, VSS on RA at discharge. Granddaughter at bedside to transport pt to SNF in [place]. IV removed. All belongings with pt on DC. Report called to [place] and given to [employer]

## 2020-05-19 NOTE — TOC Transition Note (Signed)
Transition of Care [facility]) - CM/SW Discharge Note   Patient Details  Name: Dylan Harrington MRN: [id number] Date of Birth: 1940-04-14  [facility] CM/SW Contact:  Erin Sons, LCSW Phone Number: 05/19/2020, 12:41 PM   Clinical Narrative:     Patient will DC to: [facility] SNF in [place] Anticipated DC date: 05/19/20 Family notified: Grandaughter Dylan Harrington Transport by: Juliann Mule   Per MD patient ready for DC to [facility] in [place] . RN, patient, patient's family, and facility notified of DC. Discharge Summary and FL2 sent to facility. RN to call report prior to discharge [phone]). DC packet on chart. Grandaughter has arrived to transport pt.   CSW will sign off for now as social work intervention is no longer needed. Please consult [place] again if new needs arise.   Final next level of care: Skilled Nursing Facility Barriers to Discharge: No Barriers Identified   Patient Goals and CMS Choice Patient states their goals for this hospitalization and ongoing recovery are:: Pt agreeable to Rehab and wants family to guide d/c planning CMS Medicare.gov Compare Post Acute Care list provided to:: Patient Choice offered to / list presented to : Patient  Discharge Placement              Patient chooses bed at:  [facility] in [place]) Patient to be transferred to facility by: Granddaughter Dylan Harrington Name of family member notified: Grandaughter Dylan Harrington Patient and family notified of of transfer: 05/19/20  Discharge Plan and Services   Discharge Planning Services: CM Consult Post Acute Care Choice: Skilled Nursing Facility          DME Arranged: Wheelchair manual DME Agency: [employer] Date DME Agency Contacted: 05/14/20 Time DME Agency Contacted: [phone] Representative spoke with at DME Agency: [employer]/[facility] HH Arranged: PT,OT,RN,Nurse's Aide,Social Work [employer] Agency: [facility] Date [facility] Agency Contacted:  05/13/20 Time HH Agency Contacted: [phone] Representative spoke with at [facility] Agency: Kandee Keen  Social Determinants of Health (SDOH) Interventions     Readmission Risk Interventions No flowsheet data found.

## 2020-05-20 ENCOUNTER — Other Ambulatory Visit (HOSPITAL_COMMUNITY): Payer: Self-pay

## 2020-05-26 ENCOUNTER — Ambulatory Visit (INDEPENDENT_AMBULATORY_CARE_PROVIDER_SITE_OTHER): Payer: Medicare HMO | Admitting: Physician Assistant

## 2020-05-26 ENCOUNTER — Encounter: Payer: Self-pay | Admitting: Orthopedic Surgery

## 2020-05-26 ENCOUNTER — Inpatient Hospital Stay: Payer: Medicare HMO | Admitting: Infectious Diseases

## 2020-05-26 DIAGNOSIS — L03115 Cellulitis of right lower limb: Secondary | ICD-10-CM

## 2020-05-26 NOTE — Progress Notes (Signed)
Office Visit Note   Patient: Dylan Harrington           Date of Birth: 04-17-1940           MRN: [id number] Visit Date: 05/26/2020              Requested by: Winifred Olive, MD No address on file PCP: Winifred Olive, MD  Chief Complaint  Patient presents with  . Left Foot - Routine Post Op    05/09/20 I&D left foot       HPI: Patient is a pleasant 80 year old gentleman who is almost 3 weeks status post irrigation and debridement of his left foot.  He is staying with his daughter in [place].  They have been trying to keep him off of the foot. Patient is currently on zyvox and has about 6 more doses  Assessment & Plan: Visit Diagnoses: No diagnosis found.  Plan: Patient was fit for size large Vive compression stockings.  Until the obtain these they will place a clean dry dressing every day.  Continue daily dressing changes encouraged him to stay off of this is much as possible follow-up in 2 weeks or sooner if any concerns  Follow-Up Instructions: No follow-ups on file.   Ortho Exam  Patient is alert, oriented, no adenopathy, well-dressed, normal affect, normal respiratory effort. Examination overall wound is well-healed there is 1 small central area of dehiscence.  There is no foul odor only a small amount of serous drainage.  No cellulitis no ascending cellulitis sutures were removed with the exception of the 2 right over the area of the small dehiscence pulses are palpable  Imaging: No results found. No images are attached to the encounter.  Labs: Lab Results  Component Value Date   HGBA1C 6.1 (H) 05/08/2020   ESRSEDRATE 104 (H) 05/15/2020   ESRSEDRATE 109 (H) 05/12/2020   CRP 10.4 (H) 05/15/2020   CRP 6.9 (H) 05/12/2020   CRP 6.4 (H) 05/08/2020   LABURIC 4.9 05/15/2020   REPTSTATUS 05/14/2020 FINAL 05/09/2020   GRAMSTAIN NO WBC SEEN NO ORGANISMS SEEN  05/09/2020   CULT  05/09/2020    MODERATE METHICILLIN RESISTANT STAPHYLOCOCCUS AUREUS CRITICAL  RESULT CALLED TO, READ BACK BY AND VERIFIED WITH: RN DOROTHY M. [phone] FCP NO ANAEROBES ISOLATED Performed at [facility], [address], [place], [place] [address]    LABORGA METHICILLIN RESISTANT STAPHYLOCOCCUS AUREUS 05/09/2020     Lab Results  Component Value Date   ALBUMIN 1.8 (L) 05/14/2020   ALBUMIN 2.0 (L) 05/11/2020   ALBUMIN 2.1 (L) 05/10/2020    Lab Results  Component Value Date   MG 2.0 05/15/2020   MG 1.9 05/14/2020   MG 2.0 05/11/2020   No results found for: VD25OH  No results found for: PREALBUMIN CBC EXTENDED Latest Ref Rng & Units 05/15/2020 05/14/2020 05/11/2020  WBC 4.0 - 10.5 K/uL 7.0 8.5 6.5  RBC 4.22 - 5.81 MIL/uL 3.22(L) 3.47(L) 3.61(L)  HGB 13.0 - 17.0 g/dL 10.4(L) 11.0(L) 11.6(L)  HCT 39.0 - 52.0 % 32.1(L) 33.9(L) 35.7(L)  PLT 150 - 400 K/uL 243 264 251  NEUTROABS 1.7 - 7.7 K/uL 4.5 5.8 -  LYMPHSABS 0.7 - 4.0 K/uL 1.6 1.5 -     There is no height or weight on file to calculate BMI.  Orders:  No orders of the defined types were placed in this encounter.  No orders of the defined types were placed in this encounter.    Procedures: No  procedures performed  Clinical Data: No additional findings.  ROS:  All other systems negative, except as noted in the HPI. Review of Systems  Objective: Vital Signs: There were no vitals taken for this visit.  Specialty Comments:  No specialty comments available.  PMFS History: Patient Active Problem List   Diagnosis Date Noted  . Cellulitis of right foot 05/08/2020  . Skin ulcer of plantar aspect of foot (HCC) 05/08/2020  . Nicotine dependence, cigarettes, uncomplicated 05/08/2020  . 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 Lactic acidosis 05/08/2020  . Hypokalemia 05/08/2020  . Protein-calorie malnutrition, severe 05/08/2020  . Foot abscess, right 05/08/2020  . Cutaneous abscess of right foot   . Obstructive sleep apnea 03/15/2016  . Essential hypertension 03/15/2016  .  COPD (chronic obstructive pulmonary disease) (HCC) 03/14/2016   Past Medical History:  Diagnosis Date  . COPD (chronic obstructive pulmonary disease) (HCC)   . Glaucoma   . Hypertension   . Obstructive sleep apnea 03/15/2016  . Pacemaker   . Renal disorder   . Sleep apnea     Family History  Problem Relation Age of Onset  . Heart disease Neg Hx     Past Surgical History:  Procedure Laterality Date  . I & D EXTREMITY Right 05/09/2020   Procedure: IRRIGATION AND DEBRIDEMENT FOOT;  Surgeon: Nadara Mustard, MD;  Location: [facility] OR;  Service: Orthopedics;  Laterality: Right;  . KIDNEY STONE SURGERY    . MEDIAL PARTIAL KNEE REPLACEMENT    . PACEMAKER INSERTION    . SHOULDER SURGERY    . SPINE SURGERY    . stents     Social History   Occupational History  . Not on file  Tobacco Use  . Smoking status: Heavy Tobacco Smoker    Packs/day: 0.00    Types: Cigarettes  . Smokeless tobacco: Never Used  Substance and Sexual Activity  . Alcohol use: Yes  . Drug use: No  . Sexual activity: Not on file

## 2020-06-08 ENCOUNTER — Ambulatory Visit: Payer: Medicare HMO | Admitting: Orthopedic Surgery

## 2020-06-09 ENCOUNTER — Ambulatory Visit: Payer: Medicare HMO | Admitting: Orthopedic Surgery

## 2020-06-17 ENCOUNTER — Ambulatory Visit: Payer: Self-pay

## 2020-06-17 ENCOUNTER — Ambulatory Visit (INDEPENDENT_AMBULATORY_CARE_PROVIDER_SITE_OTHER): Payer: Medicare HMO | Admitting: Physician Assistant

## 2020-06-17 ENCOUNTER — Encounter: Payer: Self-pay | Admitting: Physician Assistant

## 2020-06-17 DIAGNOSIS — L02611 Cutaneous abscess of right foot: Secondary | ICD-10-CM

## 2020-06-17 DIAGNOSIS — L03115 Cellulitis of right lower limb: Secondary | ICD-10-CM

## 2020-06-17 MED ORDER — DOXYCYCLINE HYCLATE 100 MG PO TABS: 100.0000 mg | ORAL_TABLET | Freq: Two times a day (BID) | ORAL | 0 refills | Status: AC

## 2020-06-17 NOTE — Progress Notes (Signed)
Post-Op Visit Note   Patient: Dylan Harrington           Date of Birth: Sep 01, 1940           MRN: [id number] Visit Date: 06/17/2020 PCP: Winifred Olive, MD  Chief Complaint: No chief complaint on file.   HPI:  HPI The patient is a 80 year old gentleman seen in follow up status post I and d right foot after traumatic ulcer with infection in march of this year. Completed 6 weeks of zyvox. Has been off over a week. Has not yet followed up with ID.  Unfortunately there is an area of incision that has not yet healed. Continues with a little drainage. Denies constitutional symptoms.  Daughter relates poor appetite, has been ongoing prior to this injury.   Ortho Exam To plantar foot incision, there are 2 remaining sutures. Between these has not yet healed, is 2 cm deep, cannot probe to bone. Some necrotic tissue visualized. No surrounding erythema or odor. No warmth.    Palpable dp pulse.  Visit Diagnoses:  1. Cellulitis of right foot   2. Foot abscess, right     Plan: plan for repeat I and D next Wednesday. Patient in agreement with plan. Will place on doxycycline in interim  Follow-Up Instructions: No follow-ups on file.   Imaging: No results found.  Orders:  Orders Placed This Encounter  Procedures  . Wound culture  . XR Foot 2 Views Right  . Ambulatory referral to Infectious Disease   Meds ordered this encounter  Medications  . doxycycline (VIBRA-TABS) 100 MG tablet    Sig: Take 1 tablet (100 mg total) by mouth 2 (two) times daily.    Dispense:  60 tablet    Refill:  0     PMFS History: Patient Active Problem List   Diagnosis Date Noted  . Cellulitis of right foot 05/08/2020  . Skin ulcer of plantar aspect of foot (HCC) 05/08/2020  . Nicotine dependence, cigarettes, uncomplicated 05/08/2020  . 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 Lactic acidosis 05/08/2020  . Hypokalemia 05/08/2020  . Protein-calorie malnutrition, severe 05/08/2020  . Foot abscess,  right 05/08/2020  . Cutaneous abscess of right foot   . Obstructive sleep apnea 03/15/2016  . Essential hypertension 03/15/2016  . COPD (chronic obstructive pulmonary disease) (HCC) 03/14/2016   Past Medical History:  Diagnosis Date  . COPD (chronic obstructive pulmonary disease) (HCC)   . Glaucoma   . Hypertension   . Obstructive sleep apnea 03/15/2016  . Pacemaker   . Renal disorder   . Sleep apnea     Family History  Problem Relation Age of Onset  . Heart disease Neg Hx     Past Surgical History:  Procedure Laterality Date  . I & D EXTREMITY Right 05/09/2020   Procedure: IRRIGATION AND DEBRIDEMENT FOOT;  Surgeon: Nadara Mustard, MD;  Location: [facility] OR;  Service: Orthopedics;  Laterality: Right;  . KIDNEY STONE SURGERY    . MEDIAL PARTIAL KNEE REPLACEMENT    . PACEMAKER INSERTION    . SHOULDER SURGERY    . SPINE SURGERY    . stents     Social History   Occupational History  . Not on file  Tobacco Use  . Smoking status: Heavy Tobacco Smoker    Packs/day: 0.00    Types: Cigarettes  . Smokeless tobacco: Never Used  Substance and Sexual Activity  . Alcohol use: Yes  . Drug use: No  . Sexual activity:  Not on file

## 2020-06-19 ENCOUNTER — Telehealth: Payer: Self-pay | Admitting: Orthopedic Surgery

## 2020-06-19 NOTE — Telephone Encounter (Signed)
I called Dylan Harrington to discuss surgery that is scheduled for Wednesday 06/24/20, I left a message for him to please return my call.

## 2020-06-21 LAB — WOUND CULTURE
MICRO NUMBER:: 11849330
RESULT:: NO GROWTH
SPECIMEN QUALITY:: ADEQUATE

## 2020-06-22 ENCOUNTER — Other Ambulatory Visit: Payer: Self-pay

## 2020-06-22 ENCOUNTER — Other Ambulatory Visit: Payer: Self-pay | Admitting: Physician Assistant

## 2020-06-23 ENCOUNTER — Telehealth: Payer: Self-pay | Admitting: Orthopedic Surgery

## 2020-06-23 NOTE — Telephone Encounter (Signed)
Pt called stating he doesn't want to go through with the surgery he has scheduled for 06/24/20; he would like a CB when this message has been received and the surgery cancelled please.   [phone]

## 2020-06-23 NOTE — Telephone Encounter (Signed)
I called and spoke with patient's daughter. She states that he would like to hold off on surgery for a few more weeks.  He is concerned that he will end up having to stay in the hospital for an extended period after surgery.  His daughter reports that the area is actually looking better, it has "closed up" and is not draining. Discussed this with Dr. Lajoyce Corners and advised pt's daughter that we would like to see Dylan Harrington in the office in 2 weeks to follow up.  Appointment scheduled for 07/07/20.  Also advised patient's daughter to call if patient's symptoms worsen or they have any questions. Surgery has been cancelled for 06/24/20.

## 2020-06-24 ENCOUNTER — Ambulatory Visit (HOSPITAL_COMMUNITY): Admission: RE | Admit: 2020-06-24 | Payer: Medicare HMO | Source: Home / Self Care | Admitting: Orthopedic Surgery

## 2020-06-24 ENCOUNTER — Encounter (HOSPITAL_COMMUNITY): Admission: RE | Payer: Self-pay | Source: Home / Self Care

## 2020-06-24 SURGERY — IRRIGATION AND DEBRIDEMENT EXTREMITY
Anesthesia: Choice | Laterality: Right

## 2020-07-07 ENCOUNTER — Ambulatory Visit: Payer: Medicare HMO | Admitting: Family

## 2021-04-05 IMAGING — DX DG FOOT COMPLETE 3+V*R*
2 series · 3 of 3 positions shown · non-contrast
Comparison: None.

CLINICAL DATA: Infection

EXAM:
RIGHT FOOT COMPLETE - 3+ VIEW

[Series 1: foot · 0.14mm/px · 2 of 2 slices shown]
[im 1/2]
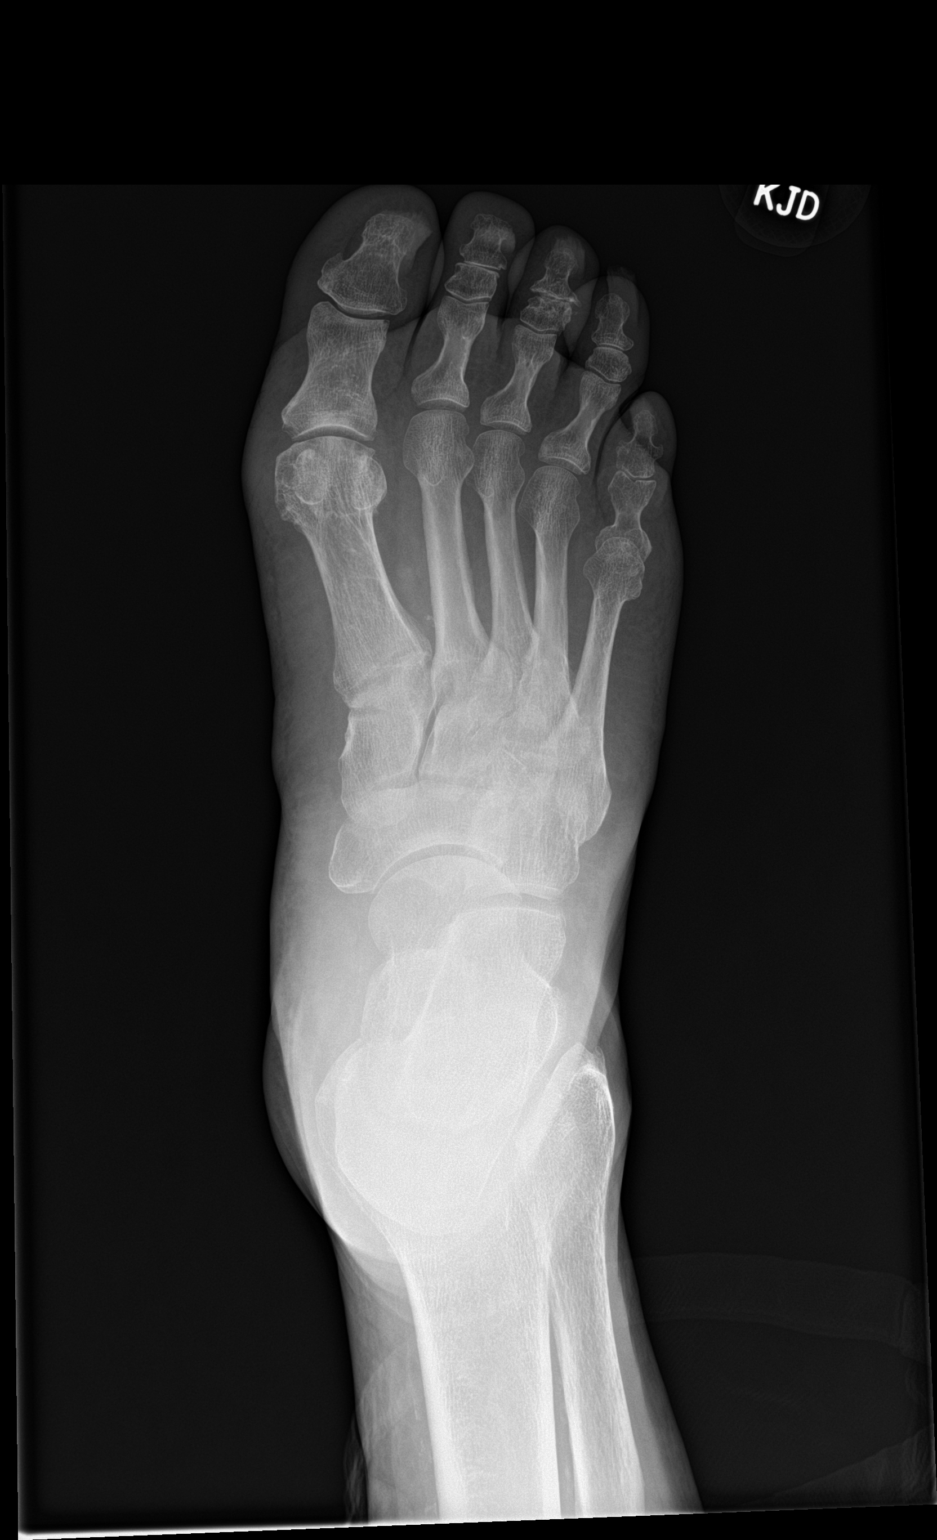
[im 2/2]
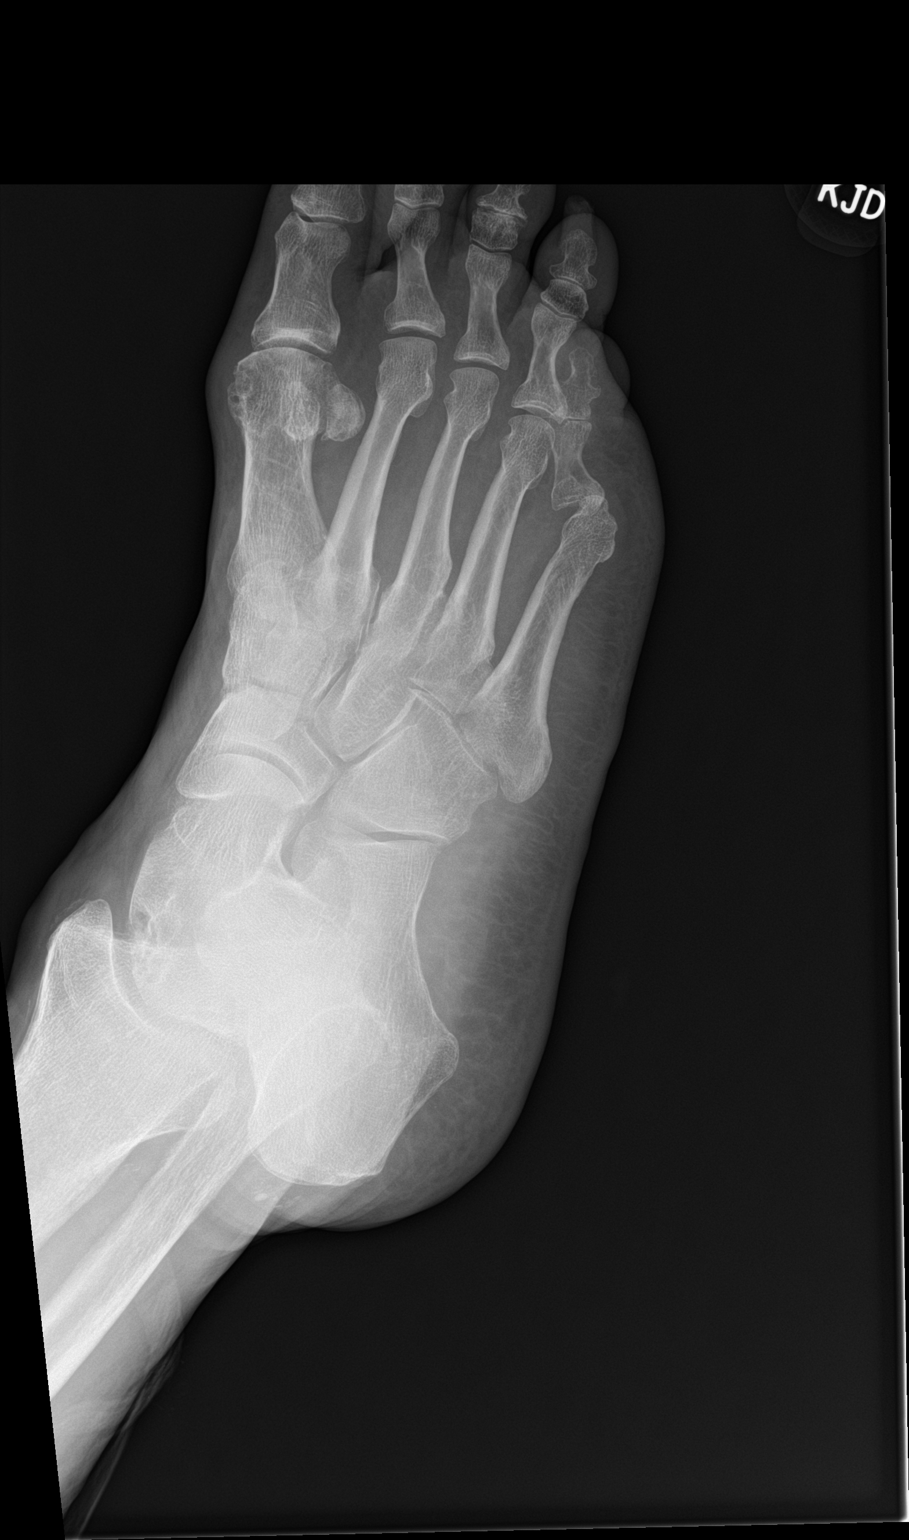

[leg]
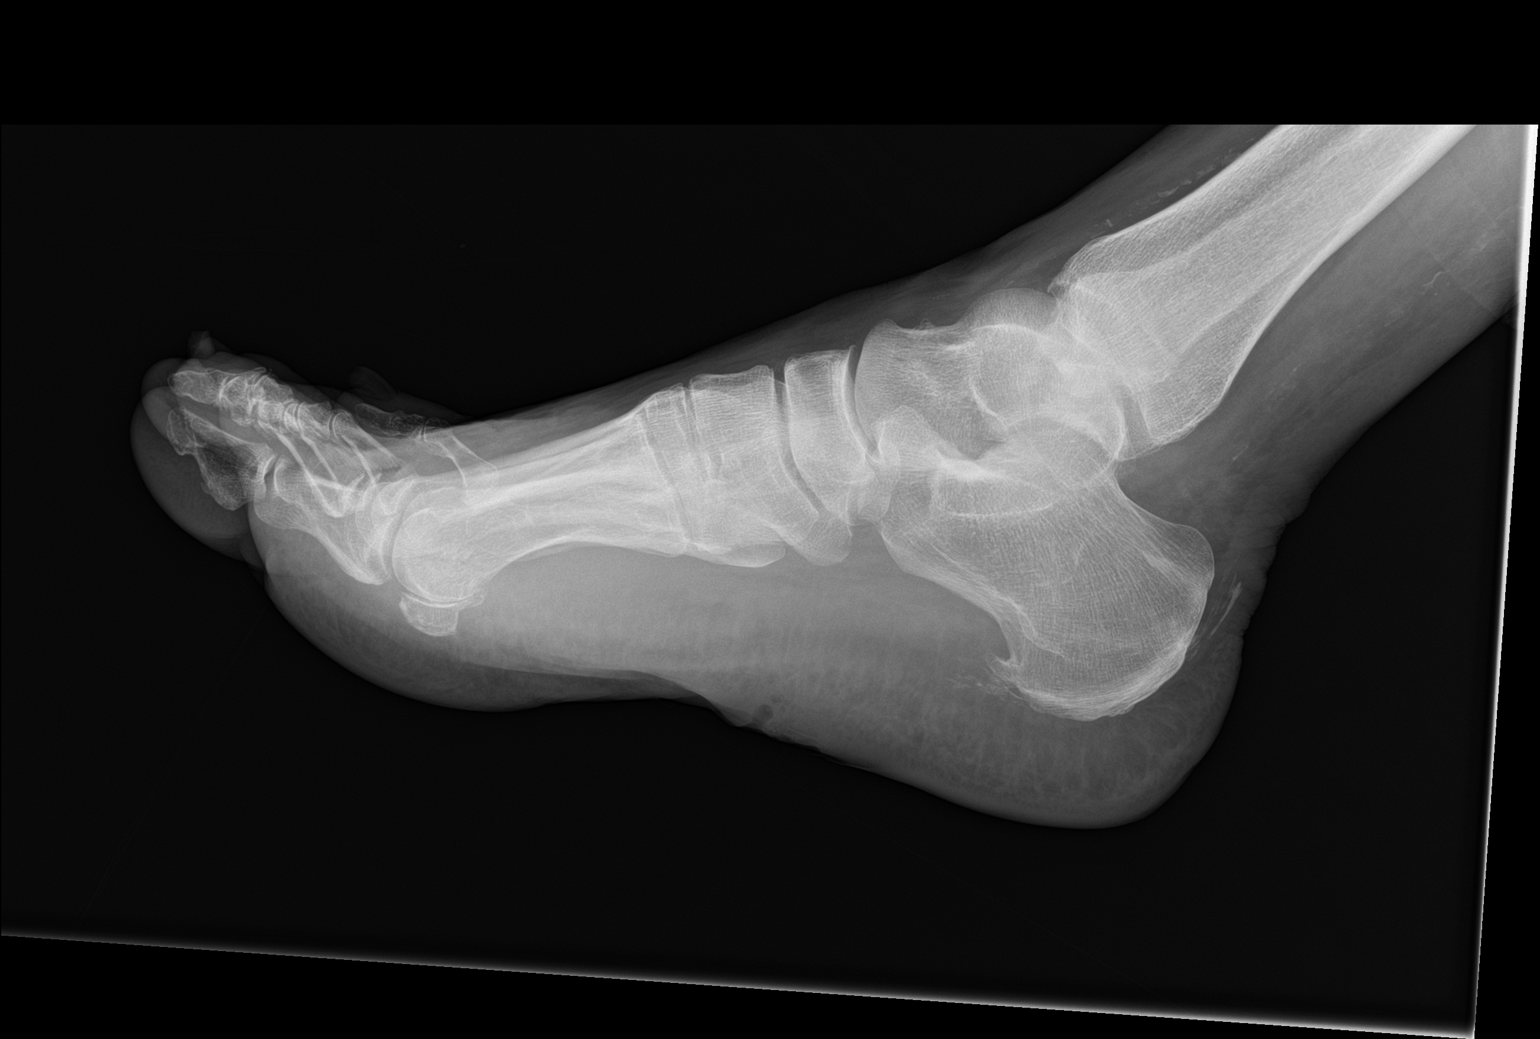

[3 of 3 positions shown; findings below may reference images not displayed]

FINDINGS: There is no evidence of fracture or dislocation. Midfoot
osteoarthritis is seen with mild joint space loss. There is
calcaneal enthesopathy. Focal area of ulceration on the mid plantar
surface with overlying subcutaneous edema.
IMPRESSION: Area of ulceration on the plantar surface with overlying soft tissue
swelling. No definite evidence of acute osteomyelitis.

## 2021-04-10 IMAGING — DX DG CHEST 1V PORT
1 series · 1 of 1 positions shown · non-contrast
Comparison: Portable exam 5079 hours compared to 05/07/2020

CLINICAL DATA: Wheezing

EXAM:
PORTABLE CHEST 1 VIEW

[chest]
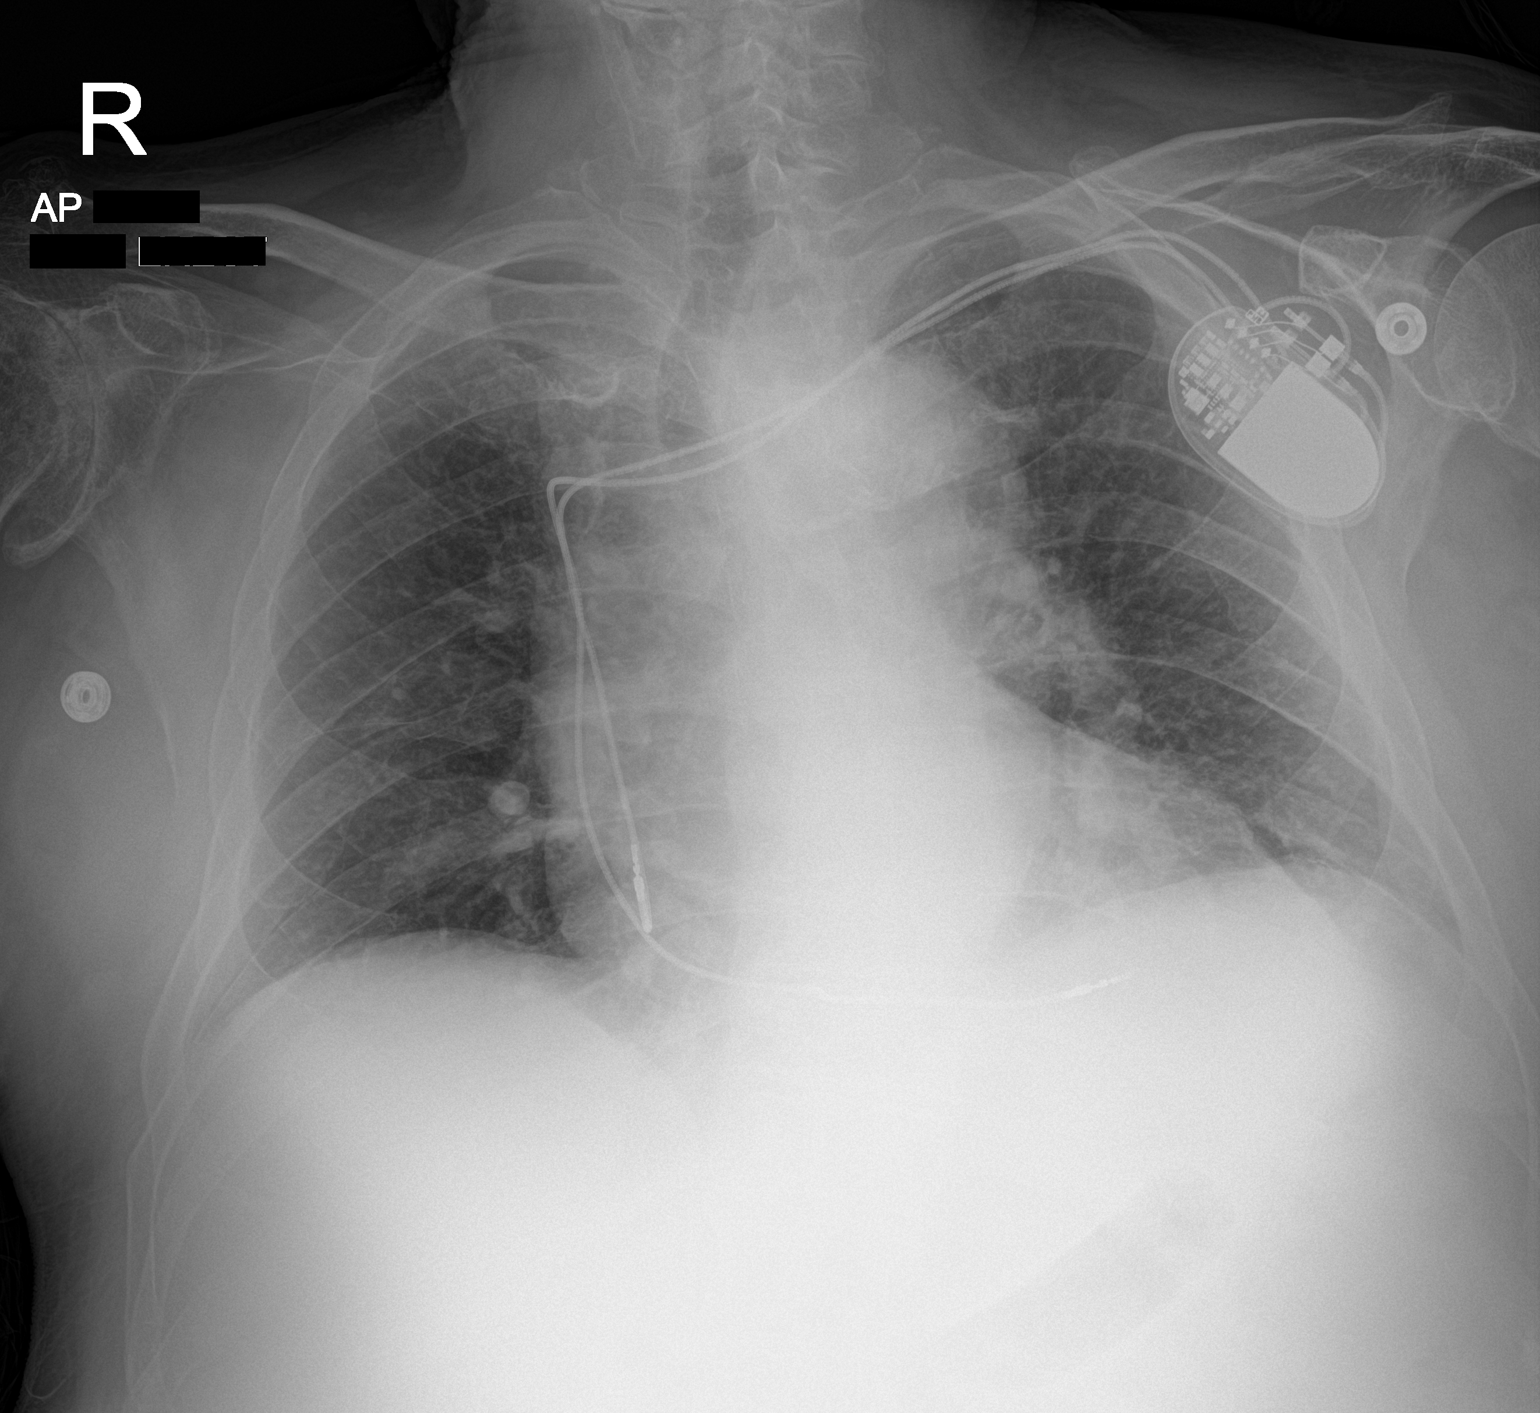

[1 of 1 positions shown; findings below may reference images not displayed]

FINDINGS: LEFT subclavian pacemaker leads project over RIGHT atrium and RIGHT
ventricle unchanged.

Accentuation of heart size.

Atherosclerotic calcification and tortuosity of thoracic aorta.

Pulmonary vascularity normal.

Mild LEFT basilar atelectasis.

No definite infiltrate, pleural effusion or pneumothorax.

Bones demineralized with advanced RIGHT glenohumeral degenerative
changes.
IMPRESSION: RIGHT basilar atelectasis.

Aortic Atherosclerosis ([id number]).

## 2021-04-11 IMAGING — DX DG KNEE 1-2V*R*
2 series · 2 of 2 positions shown · non-contrast
Comparison: None.

CLINICAL DATA: Right knee pain and swelling. Recent right foot
surgery.

EXAM:
RIGHT KNEE - 1-2 VIEW

[knee ap]
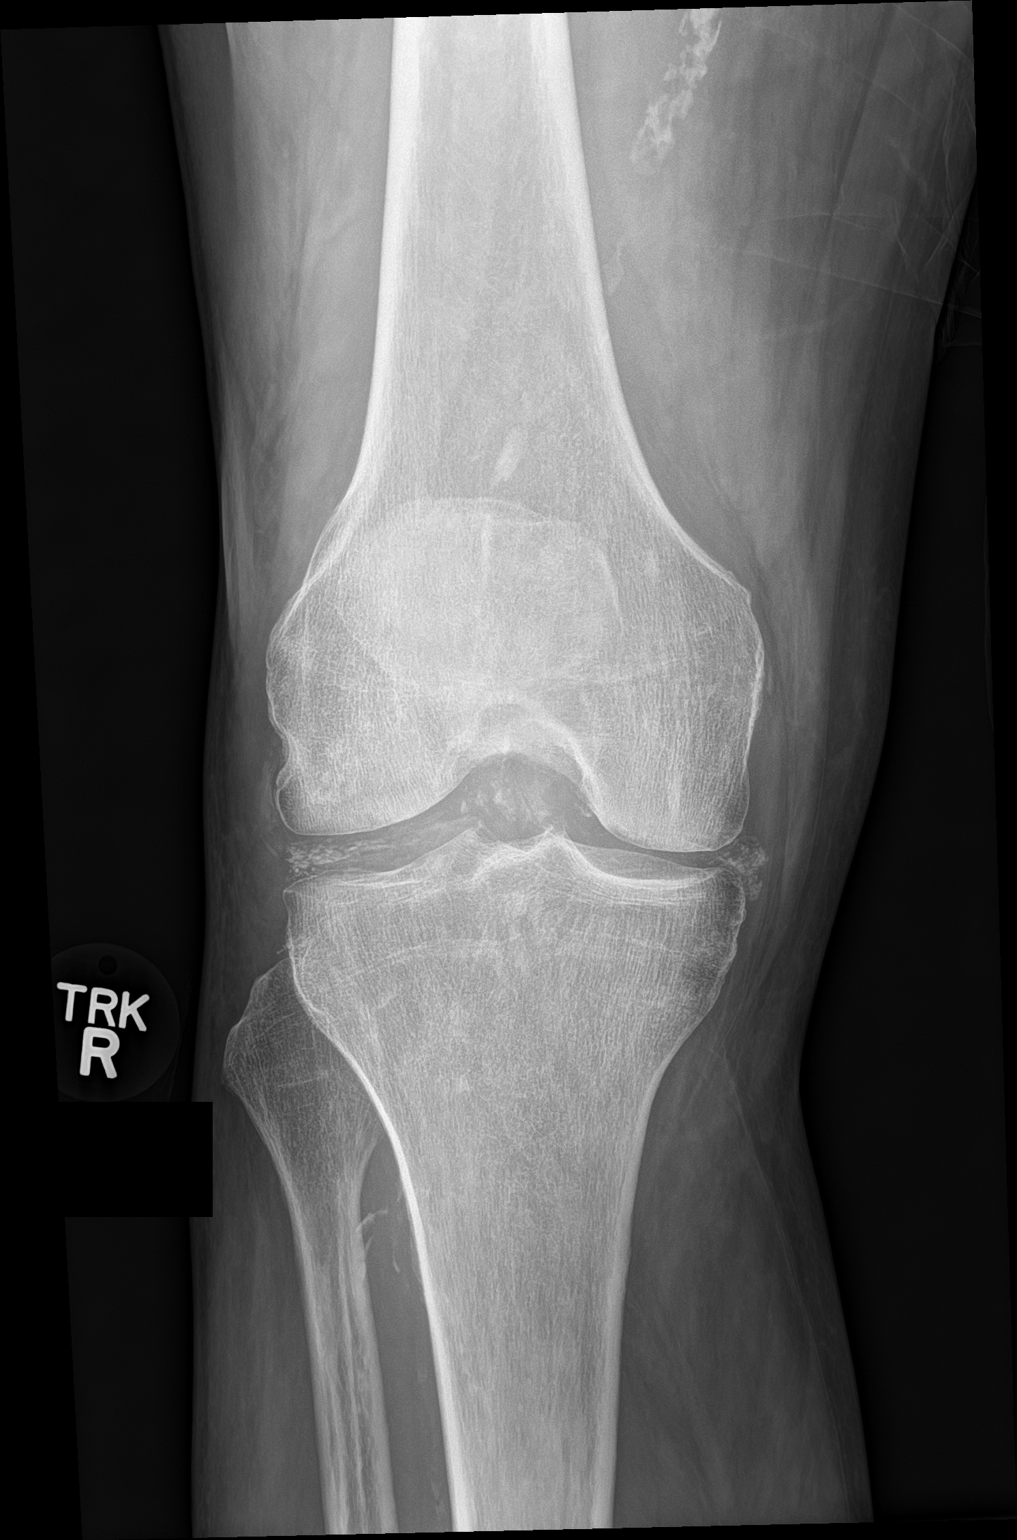

[knee lat]
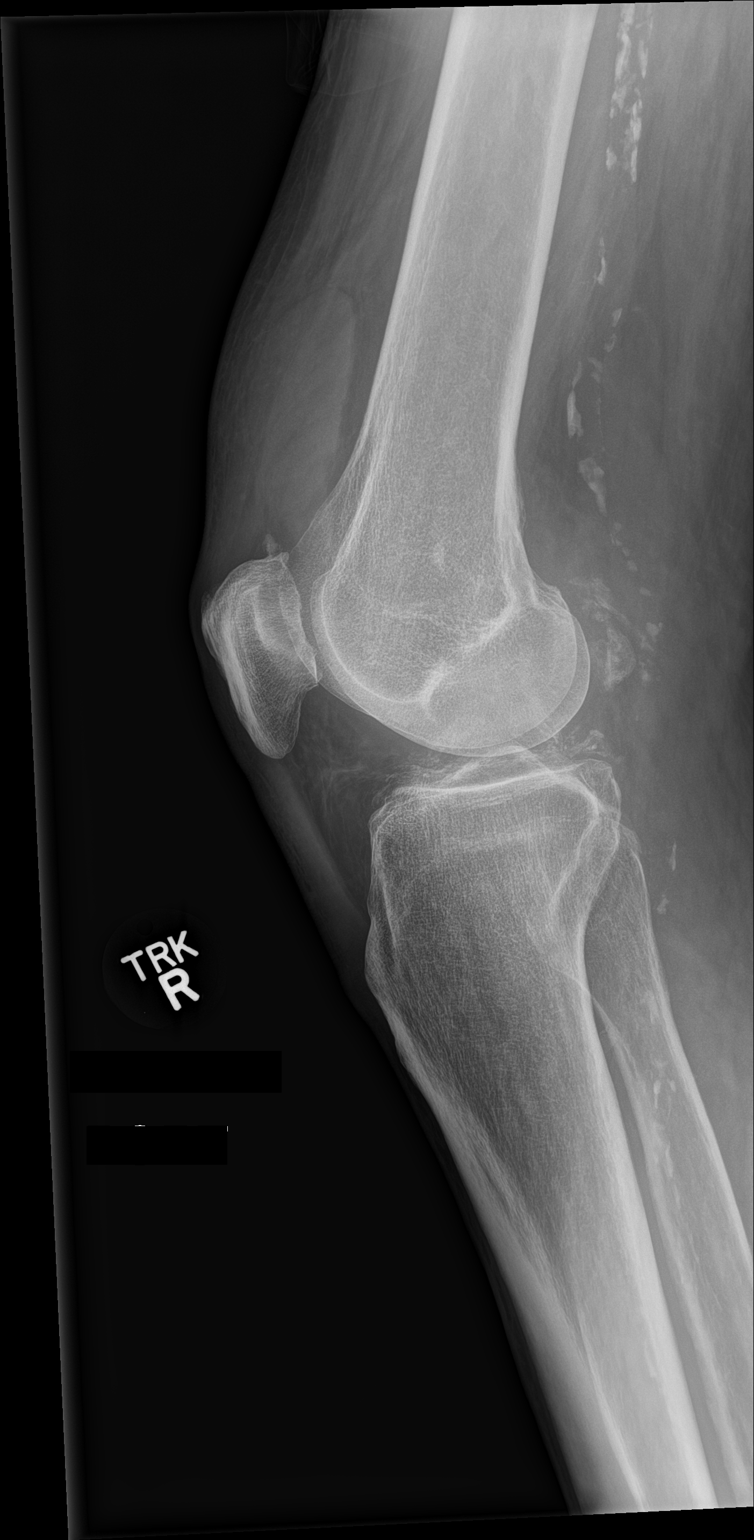

[2 of 2 positions shown; findings below may reference images not displayed]

FINDINGS: No fracture or dislocation. Patellofemoral osteoarthritis with
spurring and subchondral cystic change. Tibiofemoral alignment is
maintained. Moderate chondrocalcinosis. There is a moderate knee
joint effusion. Advanced vascular calcifications. No fracture,
erosion, or bony destruction.
IMPRESSION: 1. Moderate knee joint effusion.
2. No acute osseous abnormality.
3. Patellofemoral osteoarthritis.  Chondrocalcinosis.
4. Advanced vascular calcifications.
